# Patient Record
Sex: Female | Born: 1940
Health system: Southern US, Community
[De-identification: ages and names within clinical notes are randomized; demographics above are authoritative.]

## PROBLEM LIST (undated history)

## (undated) DIAGNOSIS — M179 Osteoarthritis of knee, unspecified: Secondary | ICD-10-CM

## (undated) DIAGNOSIS — K589 Irritable bowel syndrome without diarrhea: Secondary | ICD-10-CM

## (undated) DIAGNOSIS — F32A Depression, unspecified: Secondary | ICD-10-CM

## (undated) DIAGNOSIS — Z91018 Allergy to other foods: Secondary | ICD-10-CM

## (undated) DIAGNOSIS — R739 Hyperglycemia, unspecified: Secondary | ICD-10-CM

## (undated) DIAGNOSIS — J984 Other disorders of lung: Secondary | ICD-10-CM

## (undated) DIAGNOSIS — F17200 Nicotine dependence, unspecified, uncomplicated: Secondary | ICD-10-CM

## (undated) DIAGNOSIS — Z8719 Personal history of other diseases of the digestive system: Secondary | ICD-10-CM

## (undated) DIAGNOSIS — K219 Gastro-esophageal reflux disease without esophagitis: Secondary | ICD-10-CM

## (undated) DIAGNOSIS — M8580 Other specified disorders of bone density and structure, unspecified site: Secondary | ICD-10-CM

## (undated) DIAGNOSIS — Z923 Personal history of irradiation: Secondary | ICD-10-CM

## (undated) DIAGNOSIS — E785 Hyperlipidemia, unspecified: Secondary | ICD-10-CM

## (undated) DIAGNOSIS — C4491 Basal cell carcinoma of skin, unspecified: Secondary | ICD-10-CM

## (undated) DIAGNOSIS — K602 Anal fissure, unspecified: Secondary | ICD-10-CM

## (undated) DIAGNOSIS — M1710 Unilateral primary osteoarthritis, unspecified knee: Secondary | ICD-10-CM

## (undated) DIAGNOSIS — I2510 Atherosclerotic heart disease of native coronary artery without angina pectoris: Secondary | ICD-10-CM

## (undated) DIAGNOSIS — F419 Anxiety disorder, unspecified: Secondary | ICD-10-CM

## (undated) DIAGNOSIS — F329 Major depressive disorder, single episode, unspecified: Secondary | ICD-10-CM

## (undated) DIAGNOSIS — J302 Other seasonal allergic rhinitis: Secondary | ICD-10-CM

## (undated) DIAGNOSIS — J45909 Unspecified asthma, uncomplicated: Secondary | ICD-10-CM

## (undated) DIAGNOSIS — F338 Other recurrent depressive disorders: Secondary | ICD-10-CM

## (undated) DIAGNOSIS — J309 Allergic rhinitis, unspecified: Secondary | ICD-10-CM

## (undated) DIAGNOSIS — J8410 Pulmonary fibrosis, unspecified: Secondary | ICD-10-CM

## (undated) DIAGNOSIS — C50919 Malignant neoplasm of unspecified site of unspecified female breast: Secondary | ICD-10-CM

## (undated) HISTORY — DX: Other recurrent depressive disorders: F33.8

## (undated) HISTORY — DX: Nicotine dependence, unspecified, uncomplicated: F17.200

## (undated) HISTORY — DX: Anal fissure, unspecified: K60.2

## (undated) HISTORY — DX: Personal history of other diseases of the digestive system: Z87.19

## (undated) HISTORY — DX: Hyperlipidemia, unspecified: E78.5

## (undated) HISTORY — DX: Pulmonary fibrosis, unspecified: J84.10

## (undated) HISTORY — DX: Other disorders of lung: J98.4

## (undated) HISTORY — PX: TONSILLECTOMY: SUR1361

## (undated) HISTORY — DX: Unspecified asthma, uncomplicated: J45.909

## (undated) HISTORY — DX: Atherosclerotic heart disease of native coronary artery without angina pectoris: I25.10

## (undated) HISTORY — DX: Basal cell carcinoma of skin, unspecified: C44.91

## (undated) HISTORY — DX: Gastro-esophageal reflux disease without esophagitis: K21.9

## (undated) HISTORY — DX: Allergy to other foods: Z91.018

## (undated) HISTORY — DX: Malignant neoplasm of unspecified site of unspecified female breast: C50.919

## (undated) HISTORY — DX: Hyperglycemia, unspecified: R73.9

## (undated) HISTORY — PX: BROW LIFT: SHX178

## (undated) HISTORY — DX: Allergic rhinitis, unspecified: J30.9

## (undated) HISTORY — DX: Other specified disorders of bone density and structure, unspecified site: M85.80

## (undated) HISTORY — PX: CATARACT EXTRACTION: SUR2

## (undated) HISTORY — DX: Osteoarthritis of knee, unspecified: M17.9

## (undated) HISTORY — DX: Irritable bowel syndrome, unspecified: K58.9

## (undated) HISTORY — DX: Unilateral primary osteoarthritis, unspecified knee: M17.10

---

## 1999-05-30 ENCOUNTER — Encounter: Admission: RE | Admit: 1999-05-30 | Discharge: 1999-05-30 | Payer: Self-pay | Admitting: Family Medicine

## 1999-05-30 ENCOUNTER — Encounter: Payer: Self-pay | Admitting: Family Medicine

## 2000-07-04 ENCOUNTER — Encounter: Payer: Self-pay | Admitting: Family Medicine

## 2000-07-04 ENCOUNTER — Encounter: Admission: RE | Admit: 2000-07-04 | Discharge: 2000-07-04 | Payer: Self-pay | Admitting: Family Medicine

## 2000-07-15 ENCOUNTER — Other Ambulatory Visit: Admission: RE | Admit: 2000-07-15 | Discharge: 2000-07-15 | Payer: Self-pay | Admitting: Family Medicine

## 2001-07-23 ENCOUNTER — Encounter: Admission: RE | Admit: 2001-07-23 | Discharge: 2001-07-23 | Payer: Self-pay | Admitting: Family Medicine

## 2001-07-23 ENCOUNTER — Encounter: Payer: Self-pay | Admitting: Family Medicine

## 2001-07-31 ENCOUNTER — Other Ambulatory Visit: Admission: RE | Admit: 2001-07-31 | Discharge: 2001-07-31 | Payer: Self-pay | Admitting: Family Medicine

## 2002-01-12 ENCOUNTER — Encounter: Admission: RE | Admit: 2002-01-12 | Discharge: 2002-01-12 | Payer: Self-pay | Admitting: Family Medicine

## 2002-01-12 ENCOUNTER — Encounter: Payer: Self-pay | Admitting: Family Medicine

## 2002-07-30 ENCOUNTER — Encounter: Admission: RE | Admit: 2002-07-30 | Discharge: 2002-07-30 | Payer: Self-pay | Admitting: Family Medicine

## 2002-07-30 ENCOUNTER — Encounter: Payer: Self-pay | Admitting: Family Medicine

## 2002-08-03 ENCOUNTER — Other Ambulatory Visit: Admission: RE | Admit: 2002-08-03 | Discharge: 2002-08-03 | Payer: Self-pay | Admitting: Family Medicine

## 2003-11-10 ENCOUNTER — Ambulatory Visit (HOSPITAL_COMMUNITY): Admission: RE | Admit: 2003-11-10 | Discharge: 2003-11-10 | Payer: Self-pay | Admitting: Family Medicine

## 2004-11-10 ENCOUNTER — Ambulatory Visit (HOSPITAL_COMMUNITY): Admission: RE | Admit: 2004-11-10 | Discharge: 2004-11-10 | Payer: Self-pay | Admitting: Family Medicine

## 2004-12-05 ENCOUNTER — Other Ambulatory Visit: Admission: RE | Admit: 2004-12-05 | Discharge: 2004-12-05 | Payer: Self-pay | Admitting: Family Medicine

## 2005-11-28 ENCOUNTER — Ambulatory Visit (HOSPITAL_COMMUNITY): Admission: RE | Admit: 2005-11-28 | Discharge: 2005-11-28 | Payer: Self-pay | Admitting: Family Medicine

## 2005-12-31 ENCOUNTER — Other Ambulatory Visit: Admission: RE | Admit: 2005-12-31 | Discharge: 2005-12-31 | Payer: Self-pay | Admitting: Family Medicine

## 2007-01-21 ENCOUNTER — Ambulatory Visit (HOSPITAL_COMMUNITY): Admission: RE | Admit: 2007-01-21 | Discharge: 2007-01-21 | Payer: Self-pay | Admitting: Family Medicine

## 2007-01-31 ENCOUNTER — Encounter: Admission: RE | Admit: 2007-01-31 | Discharge: 2007-01-31 | Payer: Self-pay | Admitting: Family Medicine

## 2007-03-17 ENCOUNTER — Encounter: Admission: RE | Admit: 2007-03-17 | Discharge: 2007-03-17 | Payer: Self-pay | Admitting: Family Medicine

## 2007-08-07 ENCOUNTER — Encounter: Admission: RE | Admit: 2007-08-07 | Discharge: 2007-08-07 | Payer: Self-pay | Admitting: Family Medicine

## 2008-02-06 ENCOUNTER — Other Ambulatory Visit: Admission: RE | Admit: 2008-02-06 | Discharge: 2008-02-06 | Payer: Self-pay | Admitting: Internal Medicine

## 2008-02-13 ENCOUNTER — Ambulatory Visit: Payer: Self-pay | Admitting: Gastroenterology

## 2008-02-27 HISTORY — PX: COLONOSCOPY: SHX174

## 2008-03-02 ENCOUNTER — Ambulatory Visit: Payer: Self-pay | Admitting: Gastroenterology

## 2008-03-03 ENCOUNTER — Encounter: Payer: Self-pay | Admitting: Gastroenterology

## 2008-03-09 ENCOUNTER — Ambulatory Visit (HOSPITAL_COMMUNITY): Admission: RE | Admit: 2008-03-09 | Discharge: 2008-03-09 | Payer: Self-pay | Admitting: Gastroenterology

## 2008-04-08 ENCOUNTER — Encounter: Admission: RE | Admit: 2008-04-08 | Discharge: 2008-05-10 | Payer: Self-pay | Admitting: Rheumatology

## 2009-03-29 ENCOUNTER — Telehealth (INDEPENDENT_AMBULATORY_CARE_PROVIDER_SITE_OTHER): Payer: Self-pay | Admitting: *Deleted

## 2009-04-04 ENCOUNTER — Encounter: Admission: RE | Admit: 2009-04-04 | Discharge: 2009-04-04 | Payer: Self-pay | Admitting: Anesthesiology

## 2010-03-28 NOTE — Progress Notes (Signed)
Summary: Record request  Medical release received. Records mailed to [facility] [address] [place], [place] [address]. Michele [name]  March 29, 2009 10:55 AM

## 2010-04-06 ENCOUNTER — Other Ambulatory Visit: Payer: Self-pay | Admitting: Internal Medicine

## 2010-04-06 DIAGNOSIS — Z1231 Encounter for screening mammogram for malignant neoplasm of breast: Secondary | ICD-10-CM

## 2010-05-08 ENCOUNTER — Ambulatory Visit: Payer: Self-pay

## 2010-05-19 ENCOUNTER — Ambulatory Visit
Admission: RE | Admit: 2010-05-19 | Discharge: 2010-05-19 | Disposition: A | Discharge status: Home / Self Care | Payer: Medicare Other | Source: Ambulatory Visit | Attending: Internal Medicine | Admitting: Internal Medicine

## 2010-05-19 DIAGNOSIS — Z1231 Encounter for screening mammogram for malignant neoplasm of breast: Secondary | ICD-10-CM

## 2010-05-23 ENCOUNTER — Other Ambulatory Visit: Payer: Self-pay | Admitting: Internal Medicine

## 2010-05-23 DIAGNOSIS — R928 Other abnormal and inconclusive findings on diagnostic imaging of breast: Secondary | ICD-10-CM

## 2010-06-01 ENCOUNTER — Ambulatory Visit
Admission: RE | Admit: 2010-06-01 | Discharge: 2010-06-01 | Disposition: A | Discharge status: Home / Self Care | Payer: Medicare Other | Source: Ambulatory Visit | Attending: Internal Medicine | Admitting: Internal Medicine

## 2010-06-01 DIAGNOSIS — R928 Other abnormal and inconclusive findings on diagnostic imaging of breast: Secondary | ICD-10-CM

## 2010-10-20 ENCOUNTER — Other Ambulatory Visit: Payer: Self-pay | Admitting: Internal Medicine

## 2010-10-20 DIAGNOSIS — R921 Mammographic calcification found on diagnostic imaging of breast: Secondary | ICD-10-CM

## 2010-11-30 ENCOUNTER — Ambulatory Visit
Admission: RE | Admit: 2010-11-30 | Discharge: 2010-11-30 | Disposition: A | Discharge status: Home / Self Care | Payer: Medicare Other | Source: Ambulatory Visit | Attending: Internal Medicine | Admitting: Internal Medicine

## 2010-11-30 DIAGNOSIS — R921 Mammographic calcification found on diagnostic imaging of breast: Secondary | ICD-10-CM

## 2011-05-02 ENCOUNTER — Other Ambulatory Visit: Payer: Self-pay | Admitting: Internal Medicine

## 2011-05-02 DIAGNOSIS — R921 Mammographic calcification found on diagnostic imaging of breast: Secondary | ICD-10-CM

## 2011-05-29 ENCOUNTER — Ambulatory Visit
Admission: RE | Admit: 2011-05-29 | Discharge: 2011-05-29 | Disposition: A | Discharge status: Home / Self Care | Payer: Medicare Other | Source: Ambulatory Visit | Attending: Internal Medicine | Admitting: Internal Medicine

## 2011-05-29 DIAGNOSIS — R921 Mammographic calcification found on diagnostic imaging of breast: Secondary | ICD-10-CM

## 2012-05-08 ENCOUNTER — Other Ambulatory Visit: Payer: Self-pay

## 2012-05-08 DIAGNOSIS — Z1231 Encounter for screening mammogram for malignant neoplasm of breast: Secondary | ICD-10-CM

## 2012-06-16 ENCOUNTER — Ambulatory Visit
Admission: RE | Admit: 2012-06-16 | Discharge: 2012-06-16 | Disposition: A | Discharge status: Home / Self Care | Payer: Medicare Other | Source: Ambulatory Visit

## 2012-06-16 DIAGNOSIS — Z1231 Encounter for screening mammogram for malignant neoplasm of breast: Secondary | ICD-10-CM

## 2013-02-09 ENCOUNTER — Encounter: Payer: Self-pay | Admitting: Gastroenterology

## 2013-06-04 ENCOUNTER — Other Ambulatory Visit: Payer: Self-pay

## 2013-06-04 DIAGNOSIS — Z1231 Encounter for screening mammogram for malignant neoplasm of breast: Secondary | ICD-10-CM

## 2013-06-23 ENCOUNTER — Ambulatory Visit
Admission: RE | Admit: 2013-06-23 | Discharge: 2013-06-23 | Disposition: A | Discharge status: Home / Self Care | Payer: Medicare Other | Source: Ambulatory Visit

## 2013-06-23 ENCOUNTER — Encounter (INDEPENDENT_AMBULATORY_CARE_PROVIDER_SITE_OTHER): Payer: Self-pay

## 2013-06-23 DIAGNOSIS — Z1231 Encounter for screening mammogram for malignant neoplasm of breast: Secondary | ICD-10-CM

## 2013-08-05 ENCOUNTER — Other Ambulatory Visit (HOSPITAL_COMMUNITY): Payer: Self-pay | Admitting: Internal Medicine

## 2013-08-05 ENCOUNTER — Encounter (HOSPITAL_COMMUNITY): Payer: Self-pay | Admitting: *Deleted

## 2013-08-05 DIAGNOSIS — R0789 Other chest pain: Secondary | ICD-10-CM

## 2013-09-04 ENCOUNTER — Ambulatory Visit (HOSPITAL_COMMUNITY)
Admission: RE | Admit: 2013-09-04 | Discharge: 2013-09-04 | Disposition: A | Discharge status: Home / Self Care | Payer: Medicare Other | Source: Ambulatory Visit | Attending: Cardiovascular Disease | Admitting: Cardiovascular Disease

## 2013-09-04 DIAGNOSIS — R0789 Other chest pain: Secondary | ICD-10-CM | POA: Insufficient documentation

## 2013-09-04 NOTE — Procedures (Signed)
Exercise Treadmill Test   Test  Exercise Tolerance Test Ordering MD: Thressa Sheller, MD  Interpreting MD:   Unique Test No: 1   Treadmill:  1  Indication for ETT: chest pain - rule out ischemia  Contraindication to ETT: Yes   Stress Modality: exercise - treadmill  Cardiac Imaging Performed: non   Protocol: standard Bruce - maximal  Max BP:  217/99  Max MPHR (bpm):  145 85% MPR (bpm):  123  MPHR obtained (bpm):  144 % MPHR obtained: 99  Reached 85% MPHR (min:sec): 4:37 Total Exercise Time (min-sec): 6:01  Workload in METS:  7.00 Borg Scale:   Reason ETT Terminated:  fatigue    ST Segment Analysis At Rest: normal ST segments - no evidence of significant ST depression With Exercise: no evidence of significant ST depression  Other Information Arrhythmia:  Frequent PVC's at rest and during exercise, no change in frequency Angina during ETT:  absent (0) Quality of ETT:  diagnostic  ETT Interpretation:  normal - no evidence of ischemia by ST analysis  Comments: Frequent PVC's at rest and with exercise No ischemic EKG changes noted Good exercise effort given age Hypertensive response to exercise  Pixie Casino, MD, [facility] Attending Cardiologist [place]

## 2013-09-08 ENCOUNTER — Telehealth (HOSPITAL_COMMUNITY): Payer: Self-pay | Admitting: *Deleted

## 2014-05-28 ENCOUNTER — Other Ambulatory Visit: Payer: Self-pay

## 2014-05-28 DIAGNOSIS — Z1231 Encounter for screening mammogram for malignant neoplasm of breast: Secondary | ICD-10-CM

## 2014-06-28 ENCOUNTER — Ambulatory Visit
Admission: RE | Admit: 2014-06-28 | Discharge: 2014-06-28 | Disposition: A | Discharge status: Home / Self Care | Payer: Medicare Other | Source: Ambulatory Visit

## 2014-06-28 ENCOUNTER — Encounter (INDEPENDENT_AMBULATORY_CARE_PROVIDER_SITE_OTHER): Payer: Self-pay

## 2014-06-28 DIAGNOSIS — Z1231 Encounter for screening mammogram for malignant neoplasm of breast: Secondary | ICD-10-CM

## 2014-10-20 ENCOUNTER — Encounter: Payer: Self-pay | Admitting: Gastroenterology

## 2014-11-17 ENCOUNTER — Other Ambulatory Visit: Payer: Self-pay | Admitting: Allergy and Immunology

## 2014-11-26 LAB — ALPHA-GAL PANEL
Alpha Gal IgE*: 15.8 kU/L — ABNORMAL HIGH (ref <0.35)
Beef (Bos spp) IgE: 6.38 kU/L — ABNORMAL HIGH (ref <0.35)
Class Interpretation: 2
Class Interpretation: 2
Class Interpretation: 3
Lamb/Mutton (Ovis spp) IgE: 2.48 kU/L — ABNORMAL HIGH (ref <0.35)
Pork (Sus spp) IgE: 3.19 kU/L — ABNORMAL HIGH (ref <0.35)

## 2014-11-26 LAB — COMP. METABOLIC PANEL (12)
AST: 14 IU/L (ref 0–40)
Albumin/Globulin Ratio: 1.8 (ref 1.1–2.5)
Albumin: 4.4 g/dL (ref 3.5–4.8)
Alkaline Phosphatase: 51 IU/L (ref 39–117)
BUN/Creatinine Ratio: 13 (ref 11–26)
BUN: 13 mg/dL (ref 8–27)
Bilirubin Total: 0.5 mg/dL (ref 0.0–1.2)
Calcium: 9.6 mg/dL (ref 8.7–10.3)
Chloride: 97 mmol/L (ref 97–108)
Creatinine, Ser: 1 mg/dL (ref 0.57–1.00)
GFR calc Af Amer: 63 mL/min/{1.73_m2} (ref >59)
GFR calc non Af Amer: 55 mL/min/{1.73_m2} — ABNORMAL LOW (ref >59)
Globulin, Total: 2.5 g/dL (ref 1.5–4.5)
Glucose: 94 mg/dL (ref 65–99)
Potassium: 4.9 mmol/L (ref 3.5–5.2)
Sodium: 139 mmol/L (ref 134–144)
Total Protein: 6.9 g/dL (ref 6.0–8.5)

## 2014-11-26 LAB — CBC WITH DIFFERENTIAL/PLATELET
Basophils Absolute: 0 10*3/uL (ref 0.0–0.2)
Basos: 1 %
EOS (ABSOLUTE): 0.2 10*3/uL (ref 0.0–0.4)
Eos: 4 %
Hematocrit: 43 % (ref 34.0–46.6)
Hemoglobin: 14.5 g/dL (ref 11.1–15.9)
Immature Grans (Abs): 0 10*3/uL (ref 0.0–0.1)
Immature Granulocytes: 0 %
Lymphocytes Absolute: 2.1 10*3/uL (ref 0.7–3.1)
Lymphs: 42 %
MCH: 31.9 pg (ref 26.6–33.0)
MCHC: 33.7 g/dL (ref 31.5–35.7)
MCV: 95 fL (ref 79–97)
Monocytes Absolute: 0.4 10*3/uL (ref 0.1–0.9)
Monocytes: 8 %
Neutrophils Absolute: 2.2 10*3/uL (ref 1.4–7.0)
Neutrophils: 45 %
Platelets: 246 10*3/uL (ref 150–379)
RBC: 4.55 x10E6/uL (ref 3.77–5.28)
RDW: 14.4 % (ref 12.3–15.4)
WBC: 4.8 10*3/uL (ref 3.4–10.8)

## 2014-11-26 LAB — ANA W/REFLEX IF POSITIVE: Anti Nuclear Antibody(ANA): NEGATIVE

## 2014-11-26 LAB — SEDIMENTATION RATE: Sed Rate: 3 mm/hr (ref 0–40)

## 2014-11-26 LAB — C4 COMPLEMENT: Complement C4, Serum: 22 mg/dL (ref 14–44)

## 2014-11-26 LAB — TRYPTASE: Tryptase: 5.3 ug/L (ref 2.2–13.2)

## 2014-11-26 LAB — CHRONIC URTICARIA: cu index: 3.7 (ref <10)

## 2014-11-26 LAB — H. PYLORI BREATH TEST: H. pylori UBiT: NEGATIVE

## 2014-11-29 ENCOUNTER — Telehealth: Payer: Self-pay

## 2014-11-29 NOTE — Telephone Encounter (Signed)
-----   Message from Ephraim Hamburger, MD sent at 11/29/2014  3:00 PM EDT ----- Please inform patient: Elevated specific IgE to offer gal, beef, pork, and lamb.  Please have patient avoid nonprimate mammalian meat and have access to epinephrine autoinjector 2 pack.  If she does not have an EpiPen 2 pack, please send in the prescription.  Thank you.

## 2014-11-29 NOTE — Telephone Encounter (Signed)
Called and spoke to patient and informed her about her lab results.  I asked if she has her epipen and she stated that she has the script and I told her to make sure that she gets it filled and she stated that she was thinking about.

## 2014-12-09 NOTE — Telephone Encounter (Signed)
Encounter complete. 

## 2014-12-14 ENCOUNTER — Telehealth: Payer: Self-pay | Admitting: Allergy and Immunology

## 2014-12-14 NOTE — Telephone Encounter (Signed)
Pt had positive lab results to [facility]. Bobbitt suggested she stay away from [facility]. She is wondering how long. Is this permanent? pls advise

## 2014-12-14 NOTE — Telephone Encounter (Signed)
Dr. Verlin Fester, please advise on the below message.

## 2014-12-14 NOTE — Telephone Encounter (Signed)
Spoke with patient advise as written from Dr. Verlin Fester pt verbalizes understanding.

## 2014-12-14 NOTE — Telephone Encounter (Signed)
We will recheck alpha-gal one year after the initial test was performed. It is not necessarily permanent but she should avoid mammalian meats and have access to epinephrine autoinjector until the alpha-gal IgE levels have normalized. Thanks.

## 2015-05-25 ENCOUNTER — Other Ambulatory Visit: Payer: Self-pay

## 2015-05-25 DIAGNOSIS — Z1231 Encounter for screening mammogram for malignant neoplasm of breast: Secondary | ICD-10-CM

## 2015-07-05 ENCOUNTER — Ambulatory Visit
Admission: RE | Admit: 2015-07-05 | Discharge: 2015-07-05 | Disposition: A | Discharge status: Home / Self Care | Payer: Medicare Other | Source: Ambulatory Visit

## 2015-07-05 DIAGNOSIS — Z1231 Encounter for screening mammogram for malignant neoplasm of breast: Secondary | ICD-10-CM

## 2015-11-22 ENCOUNTER — Ambulatory Visit (INDEPENDENT_AMBULATORY_CARE_PROVIDER_SITE_OTHER): Payer: Medicare Other | Admitting: Allergy and Immunology

## 2015-11-22 ENCOUNTER — Encounter (INDEPENDENT_AMBULATORY_CARE_PROVIDER_SITE_OTHER): Payer: Self-pay

## 2015-11-22 ENCOUNTER — Encounter: Payer: Self-pay | Admitting: Allergy and Immunology

## 2015-11-22 DIAGNOSIS — T7800XD Anaphylactic reaction due to unspecified food, subsequent encounter: Secondary | ICD-10-CM | POA: Diagnosis not present

## 2015-11-22 DIAGNOSIS — T7800XA Anaphylactic reaction due to unspecified food, initial encounter: Secondary | ICD-10-CM | POA: Insufficient documentation

## 2015-11-22 NOTE — Patient Instructions (Signed)
Alpha gal hypersensitivity  Continue meticulous avoidance of all nonprimate mammalian meats and have access to epinephrine autoinjector 2 pack in case of accidental ingestion.  Emergency allergy action plan is in place.  A laboratory order form has been provided to recheck galactose-alpha-1,3-galactose IgE level.   When lab results have returned the patient will be called with further recommendations and follow up instructions.

## 2015-11-22 NOTE — Progress Notes (Signed)
    Follow-up Note  RE: Michele [name] MRN: [id number] DOB: [birth year] Date of Office Visit: 11/22/2015  Primary care provider: Thressa Sheller, MD Referring provider: Thressa Sheller, MD  History of present illness: Michele [name] is a 75 y.o. female with alpha-gal hypersensitivity presents today for follow up.  She was last seen in this clinic in September 2016.  She has attempted to strictly avoid nonprimate mammalian meats.  She reports that she accidentally bit into and egg roll containing pork and began to develop mild systemic symptoms but did not require epinephrine.   Assessment and plan: Alpha gal hypersensitivity  Continue meticulous avoidance of all nonprimate mammalian meats and have access to epinephrine autoinjector 2 pack in case of accidental ingestion.  Emergency allergy action plan is in place.  A laboratory order form has been provided to recheck galactose-alpha-1,3-galactose IgE level.   Physical examination: Blood pressure 132/70, pulse 73, temperature 98.4 F (36.9 C), temperature source Oral, resp. rate 16, height 5' 3.25" (1.607 m), weight 162 lb (73.5 kg), SpO2 96 %.  General: Alert, interactive, in no acute distress. Neck: Supple without lymphadenopathy. Lungs: Clear to auscultation without wheezing, rhonchi or rales. CV: Normal S1, S2 without murmurs. Skin: Warm and dry, without lesions or rashes.  The following portions of the patient's history were reviewed and updated as appropriate: allergies, current medications, past family history, past medical history, past social history, past surgical history and problem list.    Medication List       Accurate as of 11/22/15  1:18 PM. Always use your most recent med list.          CELEXA PO Take 10 mg by mouth as needed.   clonazePAM 0.5 MG tablet Commonly known as:  KLONOPIN Take 0.5 mg by mouth as needed for anxiety.       Allergies  Allergen Reactions  . Azithromycin Itching  .  Penicillins     REACTION: Does not work    I appreciate the opportunity to take part in [name]'s care. Please do not hesitate to contact me with questions.  Sincerely,   R. Edgar Frisk, MD

## 2015-11-22 NOTE — Assessment & Plan Note (Signed)
   Continue meticulous avoidance of all nonprimate mammalian meats and have access to epinephrine autoinjector 2 pack in case of accidental ingestion.  Emergency allergy action plan is in place.  A laboratory order form has been provided to recheck galactose-alpha-1,3-galactose IgE level.

## 2015-11-26 LAB — GALACTOSE-ALPHA-1,3-GALACTOSE IGE: Galactose-alpha-1,3-galactose IgE: 58.5 kU/L — ABNORMAL HIGH (ref <0.35)

## 2017-08-26 HISTORY — PX: BREAST BIOPSY: SHX20

## 2017-09-09 ENCOUNTER — Other Ambulatory Visit: Payer: Self-pay | Admitting: Internal Medicine

## 2017-09-09 DIAGNOSIS — Z1231 Encounter for screening mammogram for malignant neoplasm of breast: Secondary | ICD-10-CM

## 2017-09-16 ENCOUNTER — Ambulatory Visit
Admission: RE | Admit: 2017-09-16 | Discharge: 2017-09-16 | Disposition: A | Discharge status: Home / Self Care | Payer: Medicare Other | Source: Ambulatory Visit | Attending: Internal Medicine | Admitting: Internal Medicine

## 2017-09-16 DIAGNOSIS — Z1231 Encounter for screening mammogram for malignant neoplasm of breast: Secondary | ICD-10-CM

## 2017-09-18 ENCOUNTER — Other Ambulatory Visit: Payer: Self-pay | Admitting: Internal Medicine

## 2017-09-18 DIAGNOSIS — R928 Other abnormal and inconclusive findings on diagnostic imaging of breast: Secondary | ICD-10-CM

## 2017-09-20 ENCOUNTER — Ambulatory Visit
Admission: RE | Admit: 2017-09-20 | Discharge: 2017-09-20 | Disposition: A | Discharge status: Home / Self Care | Payer: Medicare Other | Source: Ambulatory Visit | Attending: Internal Medicine | Admitting: Internal Medicine

## 2017-09-20 ENCOUNTER — Other Ambulatory Visit: Payer: Self-pay | Admitting: Internal Medicine

## 2017-09-20 DIAGNOSIS — R921 Mammographic calcification found on diagnostic imaging of breast: Secondary | ICD-10-CM

## 2017-09-20 DIAGNOSIS — R928 Other abnormal and inconclusive findings on diagnostic imaging of breast: Secondary | ICD-10-CM

## 2017-09-25 ENCOUNTER — Ambulatory Visit
Admission: RE | Admit: 2017-09-25 | Discharge: 2017-09-25 | Disposition: A | Discharge status: Home / Self Care | Payer: Medicare Other | Source: Ambulatory Visit | Attending: Internal Medicine | Admitting: Internal Medicine

## 2017-09-25 ENCOUNTER — Other Ambulatory Visit: Payer: Self-pay | Admitting: Internal Medicine

## 2017-09-25 DIAGNOSIS — R928 Other abnormal and inconclusive findings on diagnostic imaging of breast: Secondary | ICD-10-CM

## 2017-09-25 DIAGNOSIS — R921 Mammographic calcification found on diagnostic imaging of breast: Secondary | ICD-10-CM

## 2017-09-26 ENCOUNTER — Other Ambulatory Visit: Payer: Self-pay | Admitting: Internal Medicine

## 2017-09-26 DIAGNOSIS — R921 Mammographic calcification found on diagnostic imaging of breast: Secondary | ICD-10-CM

## 2017-09-27 ENCOUNTER — Telehealth: Payer: Self-pay | Admitting: Hematology and Oncology

## 2017-09-27 NOTE — Telephone Encounter (Signed)
Voicemail left by patient confirming afternoon [facility] appointment for 8/14, packet will be mailed

## 2017-09-30 ENCOUNTER — Ambulatory Visit
Admission: RE | Admit: 2017-09-30 | Discharge: 2017-09-30 | Disposition: A | Discharge status: Home / Self Care | Payer: Medicare Other | Source: Ambulatory Visit | Attending: Internal Medicine | Admitting: Internal Medicine

## 2017-09-30 ENCOUNTER — Encounter: Payer: Self-pay | Admitting: *Deleted

## 2017-09-30 DIAGNOSIS — D0512 Intraductal carcinoma in situ of left breast: Secondary | ICD-10-CM | POA: Insufficient documentation

## 2017-09-30 DIAGNOSIS — R921 Mammographic calcification found on diagnostic imaging of breast: Secondary | ICD-10-CM

## 2017-10-09 ENCOUNTER — Ambulatory Visit
Admission: RE | Admit: 2017-10-09 | Discharge: 2017-10-09 | Disposition: A | Discharge status: Home / Self Care | Payer: Medicare Other | Source: Ambulatory Visit | Attending: Radiation Oncology | Admitting: Radiation Oncology

## 2017-10-09 ENCOUNTER — Other Ambulatory Visit: Payer: Self-pay | Admitting: General Surgery

## 2017-10-09 ENCOUNTER — Inpatient Hospital Stay: Payer: Medicare Other | Attending: Hematology and Oncology | Admitting: Hematology and Oncology

## 2017-10-09 ENCOUNTER — Encounter: Payer: Self-pay | Admitting: Hematology and Oncology

## 2017-10-09 ENCOUNTER — Inpatient Hospital Stay: Payer: Medicare Other

## 2017-10-09 ENCOUNTER — Ambulatory Visit: Payer: Medicare Other | Admitting: Physical Therapy

## 2017-10-09 DIAGNOSIS — Z171 Estrogen receptor negative status [ER-]: Secondary | ICD-10-CM | POA: Insufficient documentation

## 2017-10-09 DIAGNOSIS — D0512 Intraductal carcinoma in situ of left breast: Secondary | ICD-10-CM | POA: Diagnosis present

## 2017-10-09 DIAGNOSIS — C50412 Malignant neoplasm of upper-outer quadrant of left female breast: Secondary | ICD-10-CM

## 2017-10-09 LAB — CBC WITH DIFFERENTIAL (CANCER CENTER ONLY)
Basophils Absolute: 0 10*3/uL (ref 0.0–0.1)
Basophils Relative: 1 %
EOS PCT: 2 %
Eosinophils Absolute: 0.1 10*3/uL (ref 0.0–0.5)
HCT: 43.4 % (ref 34.8–46.6)
Hemoglobin: 14.3 g/dL (ref 11.6–15.9)
LYMPHS ABS: 1.5 10*3/uL (ref 0.9–3.3)
Lymphocytes Relative: 26 %
MCH: 31.3 pg (ref 25.1–34.0)
MCHC: 33 g/dL (ref 31.5–36.0)
MCV: 94.9 fL (ref 79.5–101.0)
MONOS PCT: 9 %
Monocytes Absolute: 0.5 10*3/uL (ref 0.1–0.9)
Neutro Abs: 3.7 10*3/uL (ref 1.5–6.5)
Neutrophils Relative %: 62 %
PLATELETS: 259 10*3/uL (ref 145–400)
RBC: 4.57 MIL/uL (ref 3.70–5.45)
RDW: 13.7 % (ref 11.2–14.5)
WBC Count: 5.9 10*3/uL (ref 3.9–10.3)

## 2017-10-09 LAB — CMP (CANCER CENTER ONLY)
ALK PHOS: 55 U/L (ref 38–126)
ALT: 10 U/L (ref 0–44)
AST: 16 U/L (ref 15–41)
Albumin: 4.3 g/dL (ref 3.5–5.0)
Anion gap: 11 (ref 5–15)
BUN: 15 mg/dL (ref 8–23)
CHLORIDE: 102 mmol/L (ref 98–111)
CO2: 28 mmol/L (ref 22–32)
Calcium: 9.2 mg/dL (ref 8.9–10.3)
Creatinine: 0.88 mg/dL (ref 0.44–1.00)
GFR, Est AFR Am: >60 mL/min (ref >60)
Glucose, Bld: 113 mg/dL — ABNORMAL HIGH (ref 70–99)
Potassium: 4.7 mmol/L (ref 3.5–5.1)
SODIUM: 141 mmol/L (ref 135–145)
Total Bilirubin: 0.6 mg/dL (ref 0.3–1.2)
Total Protein: 7.4 g/dL (ref 6.5–8.1)

## 2017-10-09 NOTE — Progress Notes (Signed)
Radiation Oncology         ([phone]) [phone] ________________________________  Name: Michele [name]        MRN: [id number]  Date of Service: 10/09/2017 DOB: 25-Dec-1940  WU:[id number], Thayer Jew, MD  [name], MD     REFERRING PHYSICIAN: [name], MD   DIAGNOSIS: The encounter diagnosis was Ductal carcinoma in situ (DCIS) of left breast.   HISTORY OF PRESENT ILLNESS: Michele [name] is a 77 y.o. female seen in the multidisciplinary breast clinic for a new diagnosis of left breast cancer. The patient was noted to have a group of calcifications of the left breast on screening mammography. This prompted diagnostic imaging which revealed 2 areas of calcification, a 1.5 cm span and a 1.2 cm span and on tomo imaging anterior to this and immediately behind the nipple was another area of calcifications that was not sampled as well as a fourth area also anterior to the other biopsied sites that was sampled. The two lesions in the upper outer quadrant revealed high grade DCIS with calcifications, ER/PR positive. The more anterior lesion was biopsied on 09/30/17 and was consistent with a benign fibrocystic changes. She comes today to discuss options of treatment of her cancer.    PREVIOUS RADIATION THERAPY: No   PAST MEDICAL HISTORY:  Past Medical History:  Diagnosis Date  . Asthma    only as a child; went away by age 6/19       PAST SURGICAL HISTORY: Past Surgical History:  Procedure Laterality Date  . BROW LIFT       FAMILY HISTORY:  Family History  Problem Relation Age of Onset  . Asthma Mother   . COPD Mother   . Asthma Sister   . COPD Sister      SOCIAL HISTORY:  reports that she quit smoking about 35 years ago. Her smoking use included cigarettes. She started smoking about 61 years ago. She has quit using smokeless tobacco. She reports that she drinks about 2.0 standard drinks of alcohol per week. She reports that she does not use drugs. The patient is married and lives in  [place]. She is a Licensed conveyancer.    ALLERGIES: Azithromycin; Influenza vaccines; and Penicillins   MEDICATIONS:  Current Outpatient Medications  Medication Sig Dispense Refill  . Cholecalciferol (VITAMIN D3) 1000 units CAPS Take by mouth daily.    . Citalopram Hydrobromide (CELEXA PO) Take 10 mg by mouth as needed.    . Coenzyme Q10 (COQ10 PO) Take by mouth daily.    . TURMERIC PO Take by mouth daily.     No current facility-administered medications for this encounter.      REVIEW OF SYSTEMS: On review of systems, the patient reports that she is doing well overall. She denies any chest pain, shortness of breath, cough, fevers, chills, night sweats, unintended weight changes. She denies any bowel or bladder disturbances, and denies abdominal pain, nausea or vomiting. She denies any new musculoskeletal or joint aches or pains. A complete review of systems is obtained and is otherwise negative.     PHYSICAL EXAM:  Wt Readings from Last 3 Encounters:  10/09/17 161 lb (73 kg)  11/22/15 162 lb (73.5 kg)   Temp Readings from Last 3 Encounters:  10/09/17 97.8 F (36.6 C) (Oral)  11/22/15 98.4 F (36.9 C) (Oral)   BP Readings from Last 3 Encounters:  10/09/17 (!) 169/62  11/22/15 132/70   Pulse Readings from Last 3 Encounters:  10/09/17 75  11/22/15 73  In general this is a well appearing caucasian female in no acute distress. She is alert and oriented x4 and appropriate throughout the examination. HEENT reveals that the patient is normocephalic, atraumatic. EOMs are intact. PERRLA. Skin is intact without any evidence of gross lesions. Cardiovascular exam reveals a regular rate and rhythm, no clicks rubs or murmurs are auscultated. Chest is clear to auscultation bilaterally. Lymphatic assessment is performed and does not reveal any adenopathy in the cervical, supraclavicular, axillary, or inguinal chains. Bilateral breast exam is performed and reveals multiple biopsy sites in  the left breast with anticipated post biopsy fullness deep to the sites. No erythema is noted. Abdomen has active bowel sounds in all quadrants and is intact. The abdomen is soft, non tender, non distended. Lower extremities are negative for pretibial pitting edema, deep calf tenderness, cyanosis or clubbing.   ECOG = 0  0 - Asymptomatic (Fully active, able to carry on all predisease activities without restriction)  1 - Symptomatic but completely ambulatory (Restricted in physically strenuous activity but ambulatory and able to carry out work of a light or sedentary nature. For example, light housework, office work)  2 - Symptomatic, <50% in bed during the day (Ambulatory and capable of all self care but unable to carry out any work activities. Up and about more than 50% of waking hours)  3 - Symptomatic, >50% in bed, but not bedbound (Capable of only limited self-care, confined to bed or chair 50% or more of waking hours)  4 - Bedbound (Completely disabled. Cannot carry on any self-care. Totally confined to bed or chair)  5 - Death   Eustace Pen MM, Creech RH, Tormey DC, et al. [phone]). "Toxicity and response criteria of the [facility]". [place] Oncol. 5 (6): 649-55    LABORATORY DATA:  Lab Results  Component Value Date   WBC 5.9 10/09/2017   HGB 14.3 10/09/2017   HCT 43.4 10/09/2017   MCV 94.9 10/09/2017   PLT 259 10/09/2017   Lab Results  Component Value Date   NA 141 10/09/2017   K 4.7 10/09/2017   CL 102 10/09/2017   CO2 28 10/09/2017   Lab Results  Component Value Date   ALT 10 10/09/2017   AST 16 10/09/2017   ALKPHOS 55 10/09/2017   BILITOT 0.6 10/09/2017      RADIOGRAPHY: Mm Digital Diagnostic Unilat L  Result Date: 09/20/2017 CLINICAL DATA:  Recall from screening mammography with tomosynthesis, calcifications involving the UPPER OUTER QUADRANT of the LEFT breast at MIDDLE depth. EXAM: DIGITAL DIAGNOSTIC LEFT MAMMOGRAM COMPARISON:   09/16/2017, 07/05/2015 and earlier. ACR Breast Density Category b: There are scattered areas of fibroglandular density. FINDINGS: Standard spot magnification CC and MLO views of the LEFT breast calcifications were obtained. There are 2 separate groups of calcifications involving the UPPER OUTER QUADRANT of the LEFT breast at MIDDLE depth which overlap on the spot magnification CC view. The more SUPERIOR group of calcifications spans approximately 1.5 cm and the more INFERIOR group spans approximately 1.2 cm. Both groups contain linear and branching forms and the calcifications are pleomorphic in morphology. The 2 groups are approximately 2 cm apart in the [place]. IMPRESSION: Two separate groups of suspicious calcifications involving the UPPER OUTER QUADRANT of the LEFT breast. The 2 groups are approximately 2 cm apart. The SUPERIOR group spans approximately 1.5 cm and the INFERIOR group spans approximately 1.2 cm. RECOMMENDATION: Stereotactic core needle biopsy of the 2 groups of suspicious calcifications in  the UPPER OUTER QUADRANT of the LEFT breast. The core needle biopsy procedure was discussed with the patient and her questions were answered. She has agreed to proceed and the biopsies have been scheduled for Wednesday, July 31 at 10:30 a.m. I have discussed the findings and recommendations with the patient. Results were also provided in writing at the conclusion of the visit. BI-RADS CATEGORY  4: Suspicious. Electronically Signed   By: Evangeline Dakin M.D.   On: 09/20/2017 16:39   Mm 3d Screen Breast Bilateral  Result Date: 09/17/2017 CLINICAL DATA:  Screening. EXAM: DIGITAL SCREENING BILATERAL MAMMOGRAM WITH TOMO AND CAD COMPARISON:  Previous exam(s). ACR Breast Density Category b: There are scattered areas of fibroglandular density. FINDINGS: In the left breast, calcifications warrant further evaluation. In the right breast, no findings suspicious for malignancy. Images were processed with  CAD. IMPRESSION: Further evaluation is suggested for calcifications in the left breast. RECOMMENDATION: Diagnostic mammogram of the left breast. (Code:[id number]) The patient will be contacted regarding the findings, and additional imaging will be scheduled. BI-RADS CATEGORY  0: Incomplete. Need additional imaging evaluation and/or prior mammograms for comparison. Electronically Signed   By: Lajean Manes M.D.   On: 09/17/2017 12:58   Mm Clip Placement Left  Result Date: 09/30/2017 CLINICAL DATA:  77 year old female status post stereotactic biopsy of left breast calcifications EXAM: DIAGNOSTIC LEFT MAMMOGRAM POST STEREOTACTIC BIOPSY COMPARISON:  Previous exam(s). FINDINGS: Mammographic images were obtained following stereotactic guided biopsy of left breast calcifications. A ribbon shaped clip is identified in the central left breast at anterior depth. Additional coil and X shaped tissue markers are also noted. IMPRESSION: Ribbon shaped clip in the expected location status post stereotactic biopsy of left breast calcifications. Final Assessment: Post Procedure Mammograms for Marker Placement Electronically Signed   By: Kristopher Oppenheim M.D.   On: 09/30/2017 10:16   Mm Clip Placement Left  Result Date: 09/25/2017 CLINICAL DATA:  Post biopsy mammogram of the left breast for clip placement. EXAM: DIAGNOSTIC LEFT MAMMOGRAM POST STEREOTACTIC BIOPSY COMPARISON:  Previous exam(s). FINDINGS: Mammographic images were obtained following stereotactic guided biopsy of 2 groups of calcifications in the left breast. The coil shaped biopsy marking clip is well positioned at the site of biopsy in the upper-outer superior left breast. The X shaped biopsy marking clip migrated approximately 5 cm medial to the site of biopsy. There are residual calcifications at the actual biopsy site which may be used for localization. IMPRESSION: 1. The coil shaped biopsy marking clip is well positioned at the site of biopsy in the upper-outer  left breast. 2. The X shaped biopsy marking clip is approximately 5 cm medially displaced from the site of biopsy. There are residual calcifications which may be used for localization if necessary. Final Assessment: Post Procedure Mammograms for Marker Placement Electronically Signed   By: Ammie Ferrier M.D.   On: 09/25/2017 11:35   Mm Lt Breast Bx W Loc Dev 1st Lesion Image Bx Spec Stereo Guide  Addendum Date: 10/02/2017   ADDENDUM REPORT: 10/01/2017 12:42 ADDENDUM: Pathology revealed FIBROCYSTIC CHANGES WITH CALCIFICATIONS of the Left breast, central anterior (ribbon clip). This was found to be concordant by Dr. Kristopher Oppenheim. Please note, 2 stereotactic biopsies performed on 09/25/2017 demonstrate high-grade ductal carcinoma in situ with calcifications. There is approximately 5 cm medial displacement of the X shaped clip from the site of biopsy. Residual calcifications are present which may be used for localization. Pathology results were discussed with the patient by telephone. The patient reported doing  well after the biopsy with tenderness at the site. Post biopsy instructions and care were reviewed and questions were answered. The patient was encouraged to call [facility] for any additional concerns. The patient has a recent diagnosis of left breast cancer and should follow her outlined treatment plan. The patient was referred to [facility] on October 09, 2017. Pathology results reported by Terie Purser, RN on 10/01/2017. Electronically Signed   By: Kristopher Oppenheim M.D.   On: 10/01/2017 12:42   Result Date: 10/02/2017 CLINICAL DATA:  77 year old female with known left breast DCIS presenting for additional biopsy of anterior left breast calcifications. EXAM: LEFT BREAST STEREOTACTIC CORE NEEDLE BIOPSY COMPARISON:  Previous exams. FINDINGS: The patient and I discussed the procedure of  stereotactic-guided biopsy including benefits and alternatives. We discussed the high likelihood of a successful procedure. We discussed the risks of the procedure including infection, bleeding, tissue injury, clip migration, and inadequate sampling. Informed written consent was given. The usual time out protocol was performed immediately prior to the procedure. Using sterile technique and 1% Lidocaine as local anesthetic, under stereotactic guidance, a 9 gauge vacuum assisted device was used to perform core needle biopsy of calcifications in the central left breast at anterior depth using a superior approach. Specimen radiograph was performed showing calcifications in several specimens. Specimens with calcifications are identified for pathology. Lesion quadrant: Upper outer quadrant At the conclusion of the procedure, a ribbon shaped tissue marker clip was deployed into the biopsy cavity. Follow-up 2-view mammogram was performed and dictated separately. IMPRESSION: Stereotactic-guided biopsy of left breast calcifications. No apparent complications. Electronically Signed: By: Kristopher Oppenheim M.D. On: 09/30/2017 10:15   Mm Lt Breast Bx W Loc Dev 1st Lesion Image Bx Spec Stereo Guide  Addendum Date: 09/26/2017   ADDENDUM REPORT: 09/26/2017 14:10 ADDENDUM: Pathology revealed HIGH GRADE DUCTAL CARCINOMA IN SITU WITH CALCIFICATIONS of the Left breast, both locations, upper outer superior and upper outer inferior. This was found to be concordant by Dr. Ammie Ferrier. Pathology results were discussed with the patient by telephone. The patient reported doing well after the biopsies with tenderness at the sites. Post biopsy instructions and care were reviewed and questions were answered. The patient was encouraged to call [facility] for any additional concerns. The patient was referred to [facility] at [facility] on October 02, 2017. The patient is scheduled for an additional Left breast stereotatic biopsy on September 30, 2017. Pathology results reported by Terie Purser, RN on 09/26/2017. Electronically Signed   By: Ammie Ferrier M.D.   On: 09/26/2017 14:10   Result Date: 09/26/2017 CLINICAL DATA:  77 year old female presenting for stereotactic biopsy of left breast calcifications. EXAM: LEFT BREAST STEREOTACTIC CORE NEEDLE BIOPSY COMPARISON:  Previous exams. FINDINGS: The patient and I discussed the procedure of stereotactic-guided biopsy including benefits and alternatives. We discussed the high likelihood of a successful procedure. We discussed the risks of the procedure including infection, bleeding, tissue injury, clip migration, and inadequate sampling. Informed written consent was given. The usual time out protocol was performed immediately prior to the procedure. Using sterile technique and 1% Lidocaine as local anesthetic, under stereotactic guidance, a 9 gauge vacuum assisted device was used to perform core needle biopsy of calcifications in the superior upper-outer quadrant of the left breast using a lateral approach. Specimen radiograph was performed showing calcifications within  several core samples. Specimens with calcifications are identified for pathology. Lesion quadrant: Upper-outer quadrant At the conclusion of the procedure, a coil shaped tissue marker clip was deployed into the biopsy cavity. ---------------------------------------------------------------- Using sterile technique and 1% Lidocaine as local anesthetic, under stereotactic guidance, a 9 gauge vacuum assisted device was used to perform core needle biopsy of calcifications in the inferior upper-outer quadrant of the left breast using a lateral approach. Specimen radiograph was performed showing calcifications within multiple core samples. Specimens with calcifications are identified for pathology. Lesion quadrant: Upper-outer quadrant At the conclusion of  the procedure, a X shaped tissue marker clip was deployed into the biopsy cavity. Follow-up 2-view mammogram was performed and dictated separately. IMPRESSION: 1. Stereotactic-guided biopsy of calcifications in the superior upper outer left breast. No apparent complications. 2. Stereotactic guided biopsy of calcifications in the inferior upper-outer left breast. No apparent complications. Electronically Signed: By: Ammie Ferrier M.D. On: 09/25/2017 11:30   Mm Lt Breast Bx W Loc Dev Ea Ad Lesion Img Bx Spec Stereo Guide  Addendum Date: 09/26/2017   ADDENDUM REPORT: 09/26/2017 14:10 ADDENDUM: Pathology revealed HIGH GRADE DUCTAL CARCINOMA IN SITU WITH CALCIFICATIONS of the Left breast, both locations, upper outer superior and upper outer inferior. This was found to be concordant by Dr. Ammie Ferrier. Pathology results were discussed with the patient by telephone. The patient reported doing well after the biopsies with tenderness at the sites. Post biopsy instructions and care were reviewed and questions were answered. The patient was encouraged to call [facility] for any additional concerns. The patient was referred to [facility] on October 02, 2017. The patient is scheduled for an additional Left breast stereotatic biopsy on September 30, 2017. Pathology results reported by Terie Purser, RN on 09/26/2017. Electronically Signed   By: Ammie Ferrier M.D.   On: 09/26/2017 14:10   Result Date: 09/26/2017 CLINICAL DATA:  77 year old female presenting for stereotactic biopsy of left breast calcifications. EXAM: LEFT BREAST STEREOTACTIC CORE NEEDLE BIOPSY COMPARISON:  Previous exams. FINDINGS: The patient and I discussed the procedure of stereotactic-guided biopsy including benefits and alternatives. We discussed the high likelihood of a successful procedure. We discussed the risks of the procedure including  infection, bleeding, tissue injury, clip migration, and inadequate sampling. Informed written consent was given. The usual time out protocol was performed immediately prior to the procedure. Using sterile technique and 1% Lidocaine as local anesthetic, under stereotactic guidance, a 9 gauge vacuum assisted device was used to perform core needle biopsy of calcifications in the superior upper-outer quadrant of the left breast using a lateral approach. Specimen radiograph was performed showing calcifications within several core samples. Specimens with calcifications are identified for pathology. Lesion quadrant: Upper-outer quadrant At the conclusion of the procedure, a coil shaped tissue marker clip was deployed into the biopsy cavity. ---------------------------------------------------------------- Using sterile technique and 1% Lidocaine as local anesthetic, under stereotactic guidance, a 9 gauge vacuum assisted device was used to perform core needle biopsy of calcifications in the inferior upper-outer quadrant of the left breast using a lateral approach. Specimen radiograph was performed showing calcifications within multiple core samples. Specimens with calcifications are identified for pathology. Lesion quadrant: Upper-outer quadrant At the conclusion of the procedure, a X shaped tissue marker clip was deployed into the biopsy cavity. Follow-up 2-view mammogram was performed and dictated separately. IMPRESSION: 1. Stereotactic-guided biopsy of calcifications in the superior upper outer left breast. No  apparent complications. 2. Stereotactic guided biopsy of calcifications in the inferior upper-outer left breast. No apparent complications. Electronically Signed: By: Ammie Ferrier M.D. On: 09/25/2017 11:30       IMPRESSION/PLAN: 1. High grade ER/PR negative DCIS of the left breast. Dr. Lisbeth Renshaw discusses the pathology findings and reviews the nature of left non invasive breast disease. The consensus from the  breast conference includes breast conservation with lumpectomy. We would recommend adjuvant treatment given the high grade and negative ER/PR pathways. We discussed the risks, benefits, short, and long term effects of radiotherapy, and the patient is interested in proceeding. Dr. Lisbeth Renshaw discusses the delivery and logistics of radiotherapy and anticipates a course of 4 weeks of radiotherapy. We will see her back about 2 weeks after surgery and plan same day simulation,  We anticipate starting radiotherapy about 4-6 weeks after surgery.  The above documentation reflects my direct findings during this shared patient visit. Please see the separate note by Dr. Lisbeth Renshaw on this date for the remainder of the patient's plan of care.    Carola Rhine, PAC

## 2017-10-09 NOTE — Progress Notes (Signed)
Nutrition Assessment  Reason for Assessment:  Pt seen in Breast Clinic  ASSESSMENT:   77 year old female with new diagnosis of breast cancer.  Past medical history reviewed.  Patient reports normal appetite  Medications:  reviewed  Labs: reviewed  Anthropometrics:   Height: 63.25 inches Weight: 161 lb  BMI: 28   NUTRITION DIAGNOSIS: Food and nutrition related knowledge deficit related to new diagnosis of breast cancer as evidenced by no prior need for nutrition related information.  INTERVENTION:   Discussed and provided packet of information regarding nutritional tips for breast cancer patients.  Questions answered.  Teachback method used.  Contact information provided and patient knows to contact me with questions/concerns.    MONITORING, EVALUATION, and GOAL: Pt will consume a healthy plant based diet to maintain lean body mass throughout treatment.   Zuzanna Maroney B. Zenia Resides, [place], [place] Registered Dietitian [phone] (pager)

## 2017-10-09 NOTE — Progress Notes (Signed)
Michele [name] NOTE  Patient Care Team: Deland Pretty, MD as PCP - General (Internal Medicine) [name], MD as Consulting Physician (General Surgery) Nicholas Lose, MD as Consulting Physician (Hematology and Oncology) Kyung Rudd, MD as Consulting Physician (Radiation Oncology)  CHIEF COMPLAINTS/PURPOSE OF CONSULTATION:  Newly diagnosed left breast DCIS  HISTORY OF PRESENTING ILLNESS:  Michele [name] 77 y.o. female is here because of recent diagnosis of left breast DCIS.  Patient had a routine screening mammogram that detected left breast calcifications in the upper outer quadrant measuring 1.5 cm and 1.2 cm.  Both of which were biopsy-proven to be high-grade DCIS which were ER PR negative.  Retroareolar area there were 2 groups of calcifications anterior one could not be biopsied but the posterior one was  fibrocystic changes.  Patient was presented this morning to the multidisciplinary tumor board and she is here today accompanied by her husband to discuss her treatment plan.  I reviewed her records extensively and collaborated the history with the patient.  SUMMARY OF ONCOLOGIC HISTORY:   Ductal carcinoma in situ (DCIS) of left breast   09/30/2017 Initial Diagnosis    Screening detected left breast calcifications 2 groups upper outer quadrant 1.5 cm and 1.2 cm both of which are biopsy-proven high-grade DCIS with calcifications ER PR negative.  Underneath the nipple 2 groups of calcifications.  Posterior group was biopsy-proven fibrocystic change anterior group could not be biopsied.  Tis NX stage 0      MEDICAL HISTORY:  Past Medical History:  Diagnosis Date  . Asthma    only as a child; went away by age 52/19  . GERD (gastroesophageal reflux disease)     SURGICAL HISTORY: Past Surgical History:  Procedure Laterality Date  . BROW LIFT    . CATARACT EXTRACTION      SOCIAL HISTORY: Social History   Socioeconomic History  . Marital status: Married     Spouse name: Not on file  . Number of children: Not on file  . Years of education: Not on file  . Highest education level: Not on file  Occupational History  . Not on file  Social Needs  . Financial resource strain: Not on file  . Food insecurity:    Worry: Not on file    Inability: Not on file  . Transportation needs:    Medical: Not on file    Non-medical: Not on file  Tobacco Use  . Smoking status: Former Smoker    Types: Cigarettes    Start date: 07/18/1956    Last attempt to quit: 07/19/1982    Years since quitting: 35.2  . Smokeless tobacco: Former Network engineer and Sexual Activity  . Alcohol use: Yes    Alcohol/week: 2.0 standard drinks    Types: 1 Glasses of wine, 1 Shots of liquor per week    Comment: 2 in the eveing before dinner  . Drug use: No  . Sexual activity: Never  Lifestyle  . Physical activity:    Days per week: Not on file    Minutes per session: Not on file  . Stress: Not on file  Relationships  . Social connections:    Talks on phone: Not on file    Gets together: Not on file    Attends religious service: Not on file    Active member of club or organization: Not on file    Attends meetings of clubs or organizations: Not on file    Relationship status: Not  on file  . Intimate partner violence:    Fear of current or ex partner: Not on file    Emotionally abused: Not on file    Physically abused: Not on file    Forced sexual activity: Not on file  Other Topics Concern  . Not on file  Social History Narrative  . Not on file    FAMILY HISTORY: Family History  Problem Relation Age of Onset  . Asthma Mother   . COPD Mother   . Asthma Sister   . COPD Sister   . Breast cancer Sister        half sister    ALLERGIES:  is allergic to azithromycin; influenza vaccines; and penicillins.  MEDICATIONS:  Current Outpatient Medications  Medication Sig Dispense Refill  . Cholecalciferol (VITAMIN D3) 1000 units CAPS Take by mouth daily.    .  Citalopram Hydrobromide (CELEXA PO) Take 10 mg by mouth as needed.    . Coenzyme Q10 (COQ10 PO) Take by mouth daily.    . TURMERIC PO Take by mouth daily.     No current facility-administered medications for this visit.     REVIEW OF SYSTEMS:   Constitutional: Denies fevers, chills or abnormal night sweats Eyes: Denies blurriness of vision, double vision or watery eyes Ears, nose, mouth, throat, and face: Denies mucositis or sore throat Respiratory: Denies cough, dyspnea or wheezes Cardiovascular: Denies palpitation, chest discomfort or lower extremity swelling Gastrointestinal:  Denies nausea, heartburn or change in bowel habits Skin: Denies abnormal skin rashes Lymphatics: Denies new lymphadenopathy or easy bruising Neurological:Denies numbness, tingling or new weaknesses Behavioral/Psych: Mood is stable, no new changes  Breast:  Denies any palpable lumps or discharge All other systems were reviewed with the patient and are negative.  PHYSICAL EXAMINATION: ECOG PERFORMANCE STATUS: 0 - Asymptomatic  Vitals:   10/09/17 1310  BP: (!) 169/62  Pulse: 75  Resp: 16  Temp: 97.8 F (36.6 C)  SpO2: 97%   Filed Weights   10/09/17 1310  Weight: 161 lb (73 kg)    GENERAL:alert, no distress and comfortable SKIN: skin color, texture, turgor are normal, no rashes or significant lesions EYES: normal, conjunctiva are pink and non-injected, sclera clear OROPHARYNX:no exudate, no erythema and lips, buccal mucosa, and tongue normal  NECK: supple, thyroid normal size, non-tender, without nodularity LYMPH:  no palpable lymphadenopathy in the cervical, axillary or inguinal LUNGS: clear to auscultation and percussion with normal breathing effort HEART: regular rate & rhythm and no murmurs and no lower extremity edema ABDOMEN:abdomen soft, non-tender and normal bowel sounds Musculoskeletal:no cyanosis of digits and no clubbing  PSYCH: alert & oriented x 3 with fluent speech NEURO: no focal  motor/sensory deficits BREAST: No palpable nodules in breast. No palpable axillary or supraclavicular lymphadenopathy (exam performed in the presence of a chaperone)   LABORATORY DATA:  I have reviewed the data as listed Lab Results  Component Value Date   WBC 5.9 10/09/2017   HGB 14.3 10/09/2017   HCT 43.4 10/09/2017   MCV 94.9 10/09/2017   PLT 259 10/09/2017   Lab Results  Component Value Date   NA 141 10/09/2017   K 4.7 10/09/2017   CL 102 10/09/2017   CO2 28 10/09/2017    RADIOGRAPHIC STUDIES: I have personally reviewed the radiological reports and agreed with the findings in the report.  ASSESSMENT AND PLAN:  Ductal carcinoma in situ (DCIS) of left breast 09/30/2017:Screening detected left breast calcifications 2 groups upper outer quadrant 1.5 cm and  1.2 cm both of which are biopsy-proven high-grade DCIS with calcifications ER PR negative.  Underneath the nipple 2 groups of calcifications.  Posterior group was biopsy-proven fibrocystic change anterior group could not be biopsied.  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  Pathology review: I discussed with the patient the difference between DCIS and invasive breast cancer. It is considered a precancerous lesion. DCIS is classified as a 0. It is generally detected through mammograms as calcifications. We discussed the significance of grades and its impact on prognosis. We also discussed the importance of ER and PR receptors and their implications to adjuvant treatment options. Prognosis of DCIS dependence on grade, comedo necrosis. It is anticipated that if not treated, 20-30% of DCIS can develop into invasive breast cancer.  Recommendation: 1. Breast conserving surgery with biopsy of the retroareolar abnormality 2. Followed by adjuvant radiation therapy  Return to clinic after surgery to discuss the final pathology report and come up with an adjuvant treatment plan.  All questions were  answered. The patient knows to call the clinic with any problems, questions or concerns.    Harriette Ohara, MD 10/09/17

## 2017-10-09 NOTE — Assessment & Plan Note (Signed)
09/30/2017:Screening detected left breast calcifications 2 groups upper outer quadrant 1.5 cm and 1.2 cm both of which are biopsy-proven high-grade DCIS with calcifications ER PR negative.  Underneath the nipple 2 groups of calcifications.  Posterior group was biopsy-proven fibrocystic change anterior group could not be biopsied.  Tis NX stage 0  Pathology review: I discussed with the patient the difference between DCIS and invasive breast cancer. It is considered a precancerous lesion. DCIS is classified as a 0. It is generally detected through mammograms as calcifications. We discussed the significance of grades and its impact on prognosis. We also discussed the importance of ER and PR receptors and their implications to adjuvant treatment options. Prognosis of DCIS dependence on grade, comedo necrosis. It is anticipated that if not treated, 20-30% of DCIS can develop into invasive breast cancer.  Recommendation: 1. Breast conserving surgery 2. Followed by adjuvant radiation therapy  Return to clinic after surgery to discuss the final pathology report and come up with an adjuvant treatment plan.

## 2017-10-11 ENCOUNTER — Encounter: Payer: Self-pay | Admitting: General Practice

## 2017-10-11 NOTE — Progress Notes (Signed)
[place] Psychosocial Distress Screening Spiritual Care  Reached [facility] by phone following [facility] to introduce [facility] team/resources, reviewing distress screen per protocol.  The patient scored a 7 on the Psychosocial Distress Thermometer which indicates severe distress. Also assessed for distress and other psychosocial needs.   ONCBCN DISTRESS SCREENING 10/11/2017  Screening Type Initial Screening  Distress experienced in past week (1-10) 7  Emotional problem type Nervousness/Anxiety;Adjusting to illness  Information Concerns Type Lack of info about diagnosis;Lack of info about treatment;Lack of info about complementary therapy choices  Referral to support programs Yes   Ms Freda was upbeat during this call, citing relief at the early stage of her dx, proclaiming, "I'm good. 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  Follow up needed: No. Per pt, no needs or concerns at this time. Pt is aware of ongoing [facility] team/programming resources, but please also page if needs arise or circumstances change. Thank you.   [place], [place], [facility] Pager [phone] Voicemail [phone]

## 2017-10-15 ENCOUNTER — Telehealth: Payer: Self-pay | Admitting: *Deleted

## 2017-10-15 NOTE — Telephone Encounter (Signed)
  Oncology Nurse Navigator Documentation  Navigator Location: [facility] (10/15/17 1400)   )Navigator Encounter Type: Telephone;[facility] Follow-up (10/15/17 1400) Telephone: Outgoing Call;Clinic/[facility] Follow-up (10/15/17 1400)                                                  Time Spent with Patient: 15 (10/15/17 1400)

## 2017-10-17 ENCOUNTER — Other Ambulatory Visit: Payer: Self-pay | Admitting: General Surgery

## 2017-10-17 DIAGNOSIS — Z171 Estrogen receptor negative status [ER-]: Principal | ICD-10-CM

## 2017-10-17 DIAGNOSIS — C50412 Malignant neoplasm of upper-outer quadrant of left female breast: Secondary | ICD-10-CM

## 2017-10-18 ENCOUNTER — Other Ambulatory Visit: Payer: Self-pay | Admitting: *Deleted

## 2017-10-18 DIAGNOSIS — D0512 Intraductal carcinoma in situ of left breast: Secondary | ICD-10-CM

## 2017-10-21 ENCOUNTER — Telehealth: Payer: Self-pay | Admitting: Hematology and Oncology

## 2017-10-21 ENCOUNTER — Encounter: Payer: Self-pay | Admitting: Radiation Oncology

## 2017-10-21 NOTE — Telephone Encounter (Signed)
Scheduled appt per 8/22 sch message - pt is aware of appt date and time.

## 2017-10-27 HISTORY — PX: BREAST LUMPECTOMY: SHX2

## 2017-11-01 ENCOUNTER — Other Ambulatory Visit: Payer: Self-pay

## 2017-11-01 ENCOUNTER — Encounter (HOSPITAL_BASED_OUTPATIENT_CLINIC_OR_DEPARTMENT_OTHER): Payer: Self-pay

## 2017-11-03 NOTE — H&P (Signed)
Michele [name] Location: [facility] Patient #: [id number] DOB: 30-Mar-1940 Undefined / Language: Michele [name] / Race: White Female      History of Present Illness     . This is a pleasant, healthy 77 year old female, referred by Dr. Jeanmarie Plant at the [facility] for evaluation and management of multifocal ductal carcinoma in situ left breast as well as suspicious calcifications in the retroareolar area. Dr. Deland Pretty is her PCP. She is seen in the [facility] recently  by Dr. Lindi Adie, Dr. Lisbeth Renshaw and me. Her husband was with her throughout the encounter.     She has no prior history of breast problems. Gets screening mammograms about every other year. Recent mammograms showed category B density. There are 2 groups of suspicious calcifications in the left breast, upper outer quadrant, 2 cm apart. The superior area is 1.5 cm in diameter and the inferior area is 1.2 cm. Both areas were biopsied. Both showed high grade ductal carcinoma in situ. Estrogen and progesterone receptor negative. There were also 2 areas of suspicious linear calcifications in the left retroareolar area. A deeper area was biopsied and showed fibrocystic changes. A more superficial area could not be biopsied because of its superficial location She is strongly motivated for breast conservation and I think we can try to do that.      Past history reveals former smoker quit in the 73s. Otherwise very healthy without medical or surgical problems Family history is negative for ovarian or prostate cancer. 2 half sisters had breast cancer. Social history reveals she is married. 2 children. One stepchild. Drinks 2 mixed drinks a day with her husband. Quit smoking over 30 years ago.      We had a long talk. She is not interested in mastectomy. I told her that we would try to perform left breast lumpectomy 2 with radioactive seed localization 2 to get the areas of DCIS out of her upper outer quadrant left breast. Hopefully  this can be a single incision. To excise the subareolar calcifications we are going to plan wire localization because seen would be very superficial and likely to be dislodged and/or lost. She is in favor of this approach She will be scheduled for left breast lumpectomy 2 with radioactive seed localization 2 and excision left retroareolar calcifications with wire localization. This will probably require biopsies and the wire to go in the same day in the morning of the surgery be done in the afternoon.      I discussed the indications, details, techniques, and numerous risk of the surgery with her and her husband. She is aware of the risk of bleeding, infection, cosmetic deformity, nipple will become insensate and flattened. Skin necrosis. Reoperation for positive margins. She understands all these issues. All of her questions are answered. She agrees with this plan.   Medication History  Medications Reconciled  Social History Alcohol use  Occasional alcohol use. No drug use  Tobacco use  Former smoker.  Family History  Alcohol Abuse  Mother. Heart Disease  Father. Hypertension  Father. Respiratory Condition  Mother.  Other Problems  Asthma  Gastroesophageal Reflux Disease     Review of Systems Respiratory Not Present- Bloody sputum, Chronic Cough, Difficulty Breathing, Snoring and Wheezing. Cardiovascular Not Present- Chest Pain, Difficulty Breathing Lying Down, Leg Cramps, Palpitations, Rapid Heart Rate, Shortness of Breath and Swelling of Extremities. Female Genitourinary Not Present- Frequency, Nocturia, Painful Urination, Pelvic Pain and Urgency. Musculoskeletal Not Present- Back Pain, Joint Pain, Joint Stiffness, Muscle  Pain, Muscle Weakness and Swelling of Extremities. Neurological Not Present- Decreased Memory, Fainting, Headaches, Numbness, Seizures, Tingling, Tremor, Trouble walking and Weakness. Endocrine Not Present- Cold Intolerance, Excessive Hunger,  Hair Changes, Heat Intolerance, Hot flashes and New Diabetes. Hematology Not Present- Blood Thinners, Easy Bruising, Excessive bleeding, Gland problems, HIV and Persistent Infections.   Physical Exam  General Mental Status-Alert. General Appearance-Consistent with stated age. Hydration-Well hydrated. Voice-Normal.  Head and Neck Head-normocephalic, atraumatic with no lesions or palpable masses. Trachea-midline. Thyroid Gland Characteristics - normal size and consistency.  Eye Eyeball - Bilateral-Extraocular movements intact. Sclera/Conjunctiva - Bilateral-No scleral icterus.  Chest and Lung Exam Chest and lung exam reveals -quiet, even and easy respiratory effort with no use of accessory muscles and on auscultation, normal breath sounds, no adventitious sounds and normal vocal resonance. Inspection Chest Wall - Normal. Back - normal.  Breast Note: Breasts are medium to large in size. Biopsy sites left breast laterally. No hematoma. No mass in either breast. Nipple and areolar complexes looked normal without drainage. There is no axillary adenopathy on either side.   Cardiovascular Cardiovascular examination reveals -normal heart sounds, regular rate and rhythm with no murmurs and normal pedal pulses bilaterally.  Abdomen Inspection Inspection of the abdomen reveals - No Hernias. Skin - Scar - no surgical scars. Palpation/Percussion Palpation and Percussion of the abdomen reveal - Soft, Non Tender, No Rebound tenderness, No Rigidity (guarding) and No hepatosplenomegaly. Auscultation Auscultation of the abdomen reveals - Bowel sounds normal.  Neurologic Neurologic evaluation reveals -alert and oriented x 3 with no impairment of recent or remote memory. Mental Status-Normal.  Musculoskeletal Normal Exam - Left-Upper Extremity Strength Normal and Lower Extremity Strength Normal. Normal Exam - Right-Upper Extremity Strength Normal and Lower  Extremity Strength Normal.  Lymphatic Head & Neck  General Head & Neck Lymphatics: Bilateral - Description - Normal. Axillary  General Axillary Region: Bilateral - Description - Normal. Tenderness - Non Tender. Femoral & Inguinal  Generalized Femoral & Inguinal Lymphatics: Bilateral - Description - Normal. Tenderness - Non Tender.    Assessment & Plan   CANCER OF OVERLAPPING SITES OF LEFT BREAST (C50.812)  Your recent imaging studies and biopsy shows several findings In the upper outer quadrant of the left breast there are 2 separate areas of calcifications. These were both biopsies and both showed high-grade ductal carcinoma in situ, hormone receptor negative In the area behind your nipple there are 2 areas of suspicious calcifications. One of these areas was biopsied and showed benign fibrocystic changes. The other area was so superficial that they could not biopsy itr and recommended this be excised.  We have discussed options for surgical management including multiple lumpectomies and compared that to mastectomy. You have expressed an interest in breast conservation and I think you are a good candidate for that Following breast conservation surgery you will likely receive radiation therapy. Dr. Lisbeth Renshaw will make a final decision  you will be scheduled for left breast lumpectomy 2 with radioactive seed localization 2. This will take care of the two(2) known cancers in the upper outer left breast At the same time we will perform a left breast lumpectomy with wire localization for the area of calcifications behind the nipple. This will require a second incision as I described to you. This area may or may not be cancer I discussed the indications, techniques, and numerous risk of the surgery with you and your husband.  FORMER SMOKER [phone]) Impression: Roommate. Quit in 1980s. FAMILY HISTORY OF BREAST CANCER (Z80.3)  Impression: 2 half sisters had breast cancer

## 2017-11-07 NOTE — Anesthesia Preprocedure Evaluation (Addendum)
Anesthesia Evaluation  Patient identified by MRN, date of birth, ID band Patient awake    Reviewed: Allergy & Precautions, NPO status , Patient's Chart, lab work & pertinent test results  Airway Mallampati: II  TM Distance: >3 FB Neck ROM: Full    Dental no notable dental hx. (+) Teeth Intact, Dental Advisory Given   Pulmonary asthma , former smoker,    Pulmonary exam normal breath sounds clear to auscultation       Cardiovascular Exercise Tolerance: Good negative cardio ROS Normal cardiovascular exam Rhythm:Regular Rate:Normal     Neuro/Psych Anxiety    GI/Hepatic Neg liver ROS, GERD  Medicated,  Endo/Other    Renal/GU      Musculoskeletal negative musculoskeletal ROS (+)   Abdominal   Peds  Hematology   Anesthesia Other Findings   Reproductive/Obstetrics                            Anesthesia Physical Anesthesia Plan  ASA: III  Anesthesia Plan: General   Post-op Pain Management:    Induction: Intravenous  PONV Risk Score and Plan: 3 and Treatment may vary due to age or medical condition, Ondansetron and Dexamethasone  Airway Management Planned: LMA  Additional Equipment:   Intra-op Plan:   Post-operative Plan:   Informed Consent: I have reviewed the patients History and Physical, chart, labs and discussed the procedure including the risks, benefits and alternatives for the proposed anesthesia with the patient or authorized representative who has indicated his/her understanding and acceptance.   Dental advisory given  Plan Discussed with: CRNA  Anesthesia Plan Comments:        Anesthesia Quick Evaluation

## 2017-11-08 ENCOUNTER — Encounter (HOSPITAL_BASED_OUTPATIENT_CLINIC_OR_DEPARTMENT_OTHER)
Admission: RE | Disposition: A | Discharge status: Home / Self Care | Payer: Self-pay | Source: Ambulatory Visit | Attending: General Surgery

## 2017-11-08 ENCOUNTER — Ambulatory Visit (HOSPITAL_BASED_OUTPATIENT_CLINIC_OR_DEPARTMENT_OTHER): Payer: Medicare Other | Admitting: Anesthesiology

## 2017-11-08 ENCOUNTER — Other Ambulatory Visit: Payer: Self-pay | Admitting: General Surgery

## 2017-11-08 ENCOUNTER — Ambulatory Visit (HOSPITAL_BASED_OUTPATIENT_CLINIC_OR_DEPARTMENT_OTHER)
Admission: RE | Admit: 2017-11-08 | Discharge: 2017-11-08 | Disposition: A | Discharge status: Home / Self Care | Payer: Medicare Other | Source: Ambulatory Visit | Attending: General Surgery | Admitting: General Surgery

## 2017-11-08 ENCOUNTER — Ambulatory Visit
Admission: RE | Admit: 2017-11-08 | Discharge: 2017-11-08 | Disposition: A | Discharge status: Home / Self Care | Payer: Medicare Other | Source: Ambulatory Visit | Attending: General Surgery | Admitting: General Surgery

## 2017-11-08 ENCOUNTER — Encounter (HOSPITAL_BASED_OUTPATIENT_CLINIC_OR_DEPARTMENT_OTHER): Payer: Self-pay | Admitting: Anesthesiology

## 2017-11-08 ENCOUNTER — Other Ambulatory Visit: Payer: Self-pay

## 2017-11-08 DIAGNOSIS — Z171 Estrogen receptor negative status [ER-]: Principal | ICD-10-CM

## 2017-11-08 DIAGNOSIS — C50412 Malignant neoplasm of upper-outer quadrant of left female breast: Secondary | ICD-10-CM

## 2017-11-08 DIAGNOSIS — Z87891 Personal history of nicotine dependence: Secondary | ICD-10-CM | POA: Diagnosis not present

## 2017-11-08 DIAGNOSIS — D0512 Intraductal carcinoma in situ of left breast: Secondary | ICD-10-CM | POA: Diagnosis present

## 2017-11-08 DIAGNOSIS — K219 Gastro-esophageal reflux disease without esophagitis: Secondary | ICD-10-CM | POA: Insufficient documentation

## 2017-11-08 DIAGNOSIS — Z811 Family history of alcohol abuse and dependence: Secondary | ICD-10-CM | POA: Diagnosis not present

## 2017-11-08 DIAGNOSIS — F419 Anxiety disorder, unspecified: Secondary | ICD-10-CM | POA: Diagnosis not present

## 2017-11-08 DIAGNOSIS — Z836 Family history of other diseases of the respiratory system: Secondary | ICD-10-CM | POA: Diagnosis not present

## 2017-11-08 DIAGNOSIS — Z803 Family history of malignant neoplasm of breast: Secondary | ICD-10-CM | POA: Insufficient documentation

## 2017-11-08 DIAGNOSIS — Z8249 Family history of ischemic heart disease and other diseases of the circulatory system: Secondary | ICD-10-CM | POA: Diagnosis not present

## 2017-11-08 HISTORY — DX: Depression, unspecified: F32.A

## 2017-11-08 HISTORY — DX: Anxiety disorder, unspecified: F41.9

## 2017-11-08 HISTORY — DX: Other seasonal allergic rhinitis: J30.2

## 2017-11-08 HISTORY — DX: Major depressive disorder, single episode, unspecified: F32.9

## 2017-11-08 HISTORY — PX: BREAST LUMPECTOMY WITH RADIOACTIVE SEED LOCALIZATION: SHX6424

## 2017-11-08 HISTORY — PX: BREAST LUMPECTOMY WITH NEEDLE LOCALIZATION: SHX5759

## 2017-11-08 SURGERY — BREAST LUMPECTOMY WITH RADIOACTIVE SEED LOCALIZATION
Anesthesia: General | Site: Breast | Laterality: Left

## 2017-11-08 MED ORDER — CEFAZOLIN SODIUM-DEXTROSE 2-4 GM/100ML-% IV SOLN
2.0000 g | INTRAVENOUS | Status: AC
  Administered 2017-11-08: 2 g via INTRAVENOUS

## 2017-11-08 MED ORDER — EPHEDRINE 5 MG/ML INJ
INTRAVENOUS | Status: AC
  Filled 2017-11-08: qty 10

## 2017-11-08 MED ORDER — MIDAZOLAM HCL 2 MG/2ML IJ SOLN: 1.0000 mg | INTRAMUSCULAR | Status: DC | PRN

## 2017-11-08 MED ORDER — GABAPENTIN 300 MG PO CAPS
300.0000 mg | ORAL_CAPSULE | ORAL | Status: AC
  Administered 2017-11-08: 300 mg via ORAL

## 2017-11-08 MED ORDER — FENTANYL CITRATE (PF) 100 MCG/2ML IJ SOLN
INTRAMUSCULAR | Status: AC
  Filled 2017-11-08: qty 2

## 2017-11-08 MED ORDER — EPHEDRINE SULFATE 50 MG/ML IJ SOLN
INTRAMUSCULAR | Status: DC | PRN
  Administered 2017-11-08 (×2): 10 mg via INTRAVENOUS

## 2017-11-08 MED ORDER — FENTANYL CITRATE (PF) 100 MCG/2ML IJ SOLN
50.0000 ug | INTRAMUSCULAR | Status: DC | PRN
  Administered 2017-11-08: 100 ug via INTRAVENOUS

## 2017-11-08 MED ORDER — DEXAMETHASONE SODIUM PHOSPHATE 4 MG/ML IJ SOLN: INTRAMUSCULAR | Status: DC | PRN

## 2017-11-08 MED ORDER — LIDOCAINE HCL (CARDIAC) PF 100 MG/5ML IV SOSY
PREFILLED_SYRINGE | INTRAVENOUS | Status: DC | PRN
  Administered 2017-11-08: 100 mg via INTRAVENOUS

## 2017-11-08 MED ORDER — DEXMEDETOMIDINE HCL IN NACL 200 MCG/50ML IV SOLN
INTRAVENOUS | Status: DC | PRN
  Administered 2017-11-08: 16 ug via INTRAVENOUS

## 2017-11-08 MED ORDER — PHENYLEPHRINE 40 MCG/ML (10ML) SYRINGE FOR IV PUSH (FOR BLOOD PRESSURE SUPPORT)
PREFILLED_SYRINGE | INTRAVENOUS | Status: AC
  Filled 2017-11-08: qty 10

## 2017-11-08 MED ORDER — DEXAMETHASONE SODIUM PHOSPHATE 4 MG/ML IJ SOLN
INTRAMUSCULAR | Status: DC | PRN
  Administered 2017-11-08: 4 mg via INTRAVENOUS

## 2017-11-08 MED ORDER — ACETAMINOPHEN 500 MG PO TABS
1000.0000 mg | ORAL_TABLET | ORAL | Status: AC
  Administered 2017-11-08: 1000 mg via ORAL

## 2017-11-08 MED ORDER — DEXAMETHASONE SODIUM PHOSPHATE 10 MG/ML IJ SOLN
INTRAMUSCULAR | Status: AC
  Filled 2017-11-08: qty 1

## 2017-11-08 MED ORDER — LIDOCAINE 2% (20 MG/ML) 5 ML SYRINGE
INTRAMUSCULAR | Status: AC
  Filled 2017-11-08: qty 5

## 2017-11-08 MED ORDER — SCOPOLAMINE 1 MG/3DAYS TD PT72: 1.0000 | MEDICATED_PATCH | Freq: Once | TRANSDERMAL | Status: DC | PRN

## 2017-11-08 MED ORDER — CEFAZOLIN SODIUM-DEXTROSE 2-4 GM/100ML-% IV SOLN
INTRAVENOUS | Status: AC
  Filled 2017-11-08: qty 100

## 2017-11-08 MED ORDER — CHLORHEXIDINE GLUCONATE CLOTH 2 % EX PADS: 6.0000 | MEDICATED_PAD | Freq: Once | CUTANEOUS | Status: DC

## 2017-11-08 MED ORDER — LACTATED RINGERS IV SOLN
INTRAVENOUS | Status: DC
  Administered 2017-11-08 (×2): via INTRAVENOUS

## 2017-11-08 MED ORDER — TRAMADOL HCL 50 MG PO TABS: 50.0000 mg | ORAL_TABLET | Freq: Four times a day (QID) | ORAL | 0 refills | Status: DC | PRN

## 2017-11-08 MED ORDER — ONDANSETRON HCL 4 MG/2ML IJ SOLN
INTRAMUSCULAR | Status: AC
  Filled 2017-11-08: qty 2

## 2017-11-08 MED ORDER — DEXMEDETOMIDINE HCL IN NACL 200 MCG/50ML IV SOLN
INTRAVENOUS | Status: AC
  Filled 2017-11-08: qty 50

## 2017-11-08 MED ORDER — ACETAMINOPHEN 500 MG PO TABS
ORAL_TABLET | ORAL | Status: AC
  Filled 2017-11-08: qty 2

## 2017-11-08 MED ORDER — PROPOFOL 10 MG/ML IV BOLUS
INTRAVENOUS | Status: AC
  Filled 2017-11-08: qty 20

## 2017-11-08 MED ORDER — ACETAMINOPHEN 10 MG/ML IV SOLN: 1000.0000 mg | Freq: Once | INTRAVENOUS | Status: DC | PRN

## 2017-11-08 MED ORDER — FENTANYL CITRATE (PF) 100 MCG/2ML IJ SOLN
25.0000 ug | INTRAMUSCULAR | Status: DC | PRN
  Administered 2017-11-08: 50 ug via INTRAVENOUS

## 2017-11-08 MED ORDER — ONDANSETRON HCL 4 MG/2ML IJ SOLN: 4.0000 mg | Freq: Once | INTRAMUSCULAR | Status: DC | PRN

## 2017-11-08 MED ORDER — SUCCINYLCHOLINE CHLORIDE 200 MG/10ML IV SOSY
PREFILLED_SYRINGE | INTRAVENOUS | Status: AC
  Filled 2017-11-08: qty 10

## 2017-11-08 MED ORDER — ONDANSETRON HCL 4 MG/2ML IJ SOLN
INTRAMUSCULAR | Status: DC | PRN
  Administered 2017-11-08: 4 mg via INTRAVENOUS

## 2017-11-08 MED ORDER — BUPIVACAINE-EPINEPHRINE (PF) 0.5% -1:200000 IJ SOLN
INTRAMUSCULAR | Status: DC | PRN
  Administered 2017-11-08: 10 mL

## 2017-11-08 MED ORDER — GABAPENTIN 300 MG PO CAPS
ORAL_CAPSULE | ORAL | Status: AC
  Filled 2017-11-08: qty 1

## 2017-11-08 SURGICAL SUPPLY — 72 items
APPLIER CLIP 11 MED OPEN (CLIP) ×2
APPLIER CLIP 9.375 MED OPEN (MISCELLANEOUS)
BANDAGE ACE 6X5 VEL STRL LF (GAUZE/BANDAGES/DRESSINGS) IMPLANT
BENZOIN TINCTURE PRP APPL 2/3 (GAUZE/BANDAGES/DRESSINGS) IMPLANT
BINDER BREAST LRG (GAUZE/BANDAGES/DRESSINGS) IMPLANT
BINDER BREAST MEDIUM (GAUZE/BANDAGES/DRESSINGS) IMPLANT
BINDER BREAST XLRG (GAUZE/BANDAGES/DRESSINGS) ×2 IMPLANT
BINDER BREAST XXLRG (GAUZE/BANDAGES/DRESSINGS) IMPLANT
BLADE HEX COATED 2.75 (ELECTRODE) ×2 IMPLANT
BLADE SURG 10 STRL SS (BLADE) IMPLANT
BLADE SURG 15 STRL LF DISP TIS (BLADE) ×1 IMPLANT
BLADE SURG 15 STRL SS (BLADE) ×1
CANISTER SUC SOCK COL 7IN (MISCELLANEOUS) IMPLANT
CANISTER SUCT 1200ML W/VALVE (MISCELLANEOUS) ×2 IMPLANT
CHLORAPREP W/TINT 26ML (MISCELLANEOUS) ×2 IMPLANT
CLIP APPLIE 11 MED OPEN (CLIP) ×1 IMPLANT
CLIP APPLIE 9.375 MED OPEN (MISCELLANEOUS) IMPLANT
COVER BACK TABLE 60X90IN (DRAPES) ×2 IMPLANT
COVER MAYO STAND STRL (DRAPES) ×2 IMPLANT
COVER PROBE W GEL 5X96 (DRAPES) ×2 IMPLANT
DECANTER SPIKE VIAL GLASS SM (MISCELLANEOUS) IMPLANT
DERMABOND ADVANCED (GAUZE/BANDAGES/DRESSINGS) ×1
DERMABOND ADVANCED .7 DNX12 (GAUZE/BANDAGES/DRESSINGS) ×1 IMPLANT
DEVICE DUBIN W/COMP PLATE 8390 (MISCELLANEOUS) ×2 IMPLANT
DRAIN CHANNEL 19F RND (DRAIN) IMPLANT
DRAIN HEMOVAC 1/8 X 5 (WOUND CARE) IMPLANT
DRAPE LAPAROSCOPIC ABDOMINAL (DRAPES) ×2 IMPLANT
DRAPE UTILITY XL STRL (DRAPES) ×2 IMPLANT
DRSG PAD ABDOMINAL 8X10 ST (GAUZE/BANDAGES/DRESSINGS) IMPLANT
ELECT REM PT RETURN 9FT ADLT (ELECTROSURGICAL) ×2
ELECTRODE REM PT RTRN 9FT ADLT (ELECTROSURGICAL) ×1 IMPLANT
EVACUATOR SILICONE 100CC (DRAIN) IMPLANT
GAUZE 4X4 16PLY RFD (DISPOSABLE) IMPLANT
GAUZE SPONGE 4X4 12PLY STRL (GAUZE/BANDAGES/DRESSINGS) ×2 IMPLANT
GAUZE SPONGE 4X4 12PLY STRL LF (GAUZE/BANDAGES/DRESSINGS) IMPLANT
GLOVE BIOGEL PI IND STRL 6.5 (GLOVE) ×1 IMPLANT
GLOVE BIOGEL PI IND STRL 7.0 (GLOVE) ×1 IMPLANT
GLOVE BIOGEL PI INDICATOR 6.5 (GLOVE) ×1
GLOVE BIOGEL PI INDICATOR 7.0 (GLOVE) ×1
GLOVE EUDERMIC 7 POWDERFREE (GLOVE) ×2 IMPLANT
GLOVE SURG SS PI 6.5 STRL IVOR (GLOVE) ×2 IMPLANT
GOWN STRL REUS W/ TWL LRG LVL3 (GOWN DISPOSABLE) ×1 IMPLANT
GOWN STRL REUS W/ TWL XL LVL3 (GOWN DISPOSABLE) ×1 IMPLANT
GOWN STRL REUS W/TWL LRG LVL3 (GOWN DISPOSABLE) ×1
GOWN STRL REUS W/TWL XL LVL3 (GOWN DISPOSABLE) ×1
ILLUMINATOR WAVEGUIDE N/F (MISCELLANEOUS) IMPLANT
KIT MARKER MARGIN INK (KITS) ×2 IMPLANT
LIGHT WAVEGUIDE WIDE FLAT (MISCELLANEOUS) IMPLANT
NEEDLE HYPO 25X1 1.5 SAFETY (NEEDLE) ×2 IMPLANT
NS IRRIG 1000ML POUR BTL (IV SOLUTION) ×2 IMPLANT
PACK BASIN DAY SURGERY FS (CUSTOM PROCEDURE TRAY) ×2 IMPLANT
PAD ALCOHOL SWAB (MISCELLANEOUS) ×2 IMPLANT
PENCIL BUTTON HOLSTER BLD 10FT (ELECTRODE) ×2 IMPLANT
PIN SAFETY STERILE (MISCELLANEOUS) IMPLANT
SHEET MEDIUM DRAPE 40X70 STRL (DRAPES) IMPLANT
SLEEVE SCD COMPRESS KNEE MED (MISCELLANEOUS) ×2 IMPLANT
SPONGE LAP 18X18 RF (DISPOSABLE) IMPLANT
SPONGE LAP 4X18 RFD (DISPOSABLE) ×2 IMPLANT
STRIP CLOSURE SKIN 1/2X4 (GAUZE/BANDAGES/DRESSINGS) IMPLANT
SUT ETHILON 3 0 FSL (SUTURE) IMPLANT
SUT MNCRL AB 4-0 PS2 18 (SUTURE) ×4 IMPLANT
SUT SILK 2 0 SH (SUTURE) ×4 IMPLANT
SUT VIC AB 2-0 CT1 27 (SUTURE)
SUT VIC AB 2-0 CT1 TAPERPNT 27 (SUTURE) IMPLANT
SUT VIC AB 3-0 SH 27 (SUTURE) ×2
SUT VIC AB 3-0 SH 27X BRD (SUTURE) ×1 IMPLANT
SUT VICRYL 3-0 CR8 SH (SUTURE) ×2 IMPLANT
SYR 10ML LL (SYRINGE) ×2 IMPLANT
TOWEL GREEN STERILE FF (TOWEL DISPOSABLE) ×2 IMPLANT
TOWEL OR NON WOVEN STRL DISP B (DISPOSABLE) ×2 IMPLANT
TUBE CONNECTING 20X1/4 (TUBING) ×2 IMPLANT
YANKAUER SUCT BULB TIP NO VENT (SUCTIONS) ×2 IMPLANT

## 2017-11-08 NOTE — Anesthesia Procedure Notes (Signed)
Procedure Name: LMA Insertion Date/Time: 11/08/2017 10:56 AM Performed by: Willa Frater, CRNA Pre-anesthesia Checklist: Patient identified, Emergency Drugs available, Suction available and Patient being monitored Patient Re-evaluated:Patient Re-evaluated prior to induction Oxygen Delivery Method: Circle system utilized Preoxygenation: Pre-oxygenation with 100% oxygen Induction Type: IV induction Ventilation: Mask ventilation without difficulty LMA: LMA inserted LMA Size: 4.0 Number of attempts: 1 Airway Equipment and Method: Bite block Placement Confirmation: positive ETCO2 Tube secured with: Tape Dental Injury: Teeth and Oropharynx as per pre-operative assessment

## 2017-11-08 NOTE — Anesthesia Postprocedure Evaluation (Signed)
Anesthesia Post Note  Patient: Michele [name]  Procedure(s) Performed: LEFT BREAST LUMPECTOMY WITH RADIOACTIVE SEED LOCALIZATION X 2 (Left Breast) LEFT BREAST SUBAREOLAR BIOPSY WITH NEEDLE LOCALIZATION (Left Breast)     Patient location during evaluation: PACU Anesthesia Type: General Level of consciousness: awake and alert Pain management: pain level controlled Vital Signs Assessment: post-procedure vital signs reviewed and stable Respiratory status: spontaneous breathing, nonlabored ventilation, respiratory function stable and patient connected to nasal cannula oxygen Cardiovascular status: blood pressure returned to baseline and stable Postop Assessment: no apparent nausea or vomiting Anesthetic complications: no    Last Vitals:  Vitals:   11/08/17 1245 11/08/17 1300  BP: 132/61 (!) 113/52  Pulse: 73 70  Resp: 16 15  Temp:    SpO2: 100% 97%    Last Pain:  Vitals:   11/08/17 1315  TempSrc:   PainSc: 3                  [place]

## 2017-11-08 NOTE — Transfer of Care (Signed)
Immediate Anesthesia Transfer of Care Note  Patient: Michele [name]  Procedure(s) Performed: LEFT BREAST LUMPECTOMY WITH RADIOACTIVE SEED LOCALIZATION X 2 (Left Breast) LEFT BREAST SUBAREOLAR BIOPSY WITH NEEDLE LOCALIZATION (Left Breast)  Patient Location: PACU  Anesthesia Type:General  Level of Consciousness: awake, alert  and oriented  Airway & Oxygen Therapy: Patient Spontanous Breathing and Patient connected to face mask oxygen  Post-op Assessment: Report given to RN and Post -op Vital signs reviewed and stable  Post vital signs: Reviewed and stable  Last Vitals:  Vitals Value Taken Time  BP    Temp    Pulse 84 11/08/2017 12:13 PM  Resp    SpO2 100 % 11/08/2017 12:13 PM  Vitals shown include unvalidated device data.  Last Pain:  Vitals:   11/08/17 0950  TempSrc: Oral  PainSc: 0-No pain         Complications: No apparent anesthesia complications

## 2017-11-08 NOTE — Op Note (Signed)
Patient Name:           Michele [name]   Date of Surgery:        11/08/2017  Pre op Diagnosis:      Multifocal ductal carcinoma in situ, left breast, upper outer quadrant, ER negative                                       Retroareolar calcifications, suspicious  Post op Diagnosis:    Same  Procedure:                 Left breast lumpectomy x2 with radioactive seed localization x2 and margin assessment                                      Left breast retroareolar lumpectomy with wire localization and margin assessment  Surgeon:                     Edsel Petrin. Dalbert Batman, M.D., FACS  Assistant:                      OR staff  Operative Indications:   This is a pleasant, healthy 77 year old female, referred by Dr. Jeanmarie Plant at the [facility] for evaluation and management of multifocal ductal carcinoma in situ left breast as well as suspicious calcifications in the retroareolar area. Dr. Deland Pretty is her PCP. She is seen in the [facility] recently  by Dr. Lindi Adie, Dr. Lisbeth Renshaw and me. Her husband was with her throughout the encounter.     She has no prior history of breast problems. Gets screening mammograms about every other year. Recent mammograms showed category B density. There are 2 groups of suspicious calcifications in the left breast, upper outer quadrant, 2 cm apart. The superior area is 1.5 cm in diameter and the inferior area is 1.2 cm. Both areas were biopsied. Both showed high grade ductal carcinoma in situ. Estrogen and progesterone receptor negative.     There were also 2 areas of suspicious linear calcifications in the left retroareolar area. A deeper area was biopsied and showed fibrocystic changes. A more superficial area could not be biopsied because of its superficial location She is strongly motivated for breast conservation and I think we can try to do that. Family history is negative for ovarian or prostate cancer. 2 half sisters had breast cancer.      We had a long talk. She is not  interested in mastectomy. I told her that we would try to perform left breast lumpectomy 2 with radioactive seed localization 2 to get the areas of DCIS out of her upper outer quadrant left breast. Hopefully this can be a single incision. To excise the subareolar calcifications we are going to plan wire localization because seen would be very superficial and likely to be dislodged and/or lost. She is in favor of this approach She will be scheduled for left breast lumpectomy 2 with radioactive seed localization 2 and excision left retroareolar calcifications with wire localization. . She agrees with this plan.  Operative Findings:       In the left breast upper outer quadrant I made a single curvilinear incision and the specimen was quite generous and contained both seeds and marker clips.  They seem to be centered within the  specimen.  The wire localization entered the lateral areolar margin and we made a circumareolar incision at the areolar margin and took the tissue out behind the nipple and areola.  The specimen mammogram showed the wire was intact in the area of concern was completely excised.  Both of the specimens were marked for margins.  Procedure in Detail:          Following the induction of general LMA anesthesia the patient's left breast was prepped and draped in a sterile fashion.  Intravenous antibiotics were given.  Surgical timeout was performed.  0.5% Marcaine with epinephrine was used as local infiltration anesthetic.       Using the neoprobe I found and isolated the areas of I-125 radioactive seeds.  I planned a curvilinear incision in the lateral left breast.  This incision was made with a knife.  Generous lumpectomy was performed using electrocautery and the neoprobe.  The specimen was removed.  Marked with silk sutures and a 6 color ink kit.  The specimen mammogram looked good as described above.  This wound was irrigated.  Hemostasis was excellent.  5 metal marker clips were  placed in the walls of the lumpectomy cavity.  The lumpectomy tissues were closed with 3-0 Vicryl sutures and the skin closed with a running subcuticular 4-0 Monocryl and Dermabond.     A second incision was made at the areolar margin laterally.  The wire was present.  Dissection was carried superficially under the nipple and areola and the lumpectomy was performed using electrocautery.  The specimen was removed and the specimen mammogram looked good containing the entire wire in the area of concern.  Hemostasis was excellent.  Tissues were irrigated.  The breast tissues were reapproximated with interrupted sutures of 3-0 Vicryl and the skin closed with a running subcuticular 4-0 Monocryl and Dermabond.  Dry bandages and a breast binder were placed.  The patient tolerated the procedure well and was taken to PACU in stable condition.  EBL 20 cc or less.  Counts correct.  Complications none.   Addendum: I logged onto the [employer] and reviewed her prescription medication history.           Edsel Petrin. Dalbert Batman, M.D., FACS General and Minimally Invasive Surgery Breast and Colorectal Surgery  11/08/2017 12:09 PM

## 2017-11-08 NOTE — Anesthesia Postprocedure Evaluation (Signed)
Anesthesia Post Note  Patient: BRITTNAY PIGMAN  Procedure(s) Performed: LEFT BREAST LUMPECTOMY WITH RADIOACTIVE SEED LOCALIZATION X 2 (Left Breast) LEFT BREAST SUBAREOLAR BIOPSY WITH NEEDLE LOCALIZATION (Left Breast)     Patient location during evaluation: PACU Anesthesia Type: General Level of consciousness: awake and alert Pain management: pain level controlled Vital Signs Assessment: post-procedure vital signs reviewed and stable Respiratory status: spontaneous breathing, nonlabored ventilation, respiratory function stable and patient connected to nasal cannula oxygen Cardiovascular status: blood pressure returned to baseline and stable Postop Assessment: no apparent nausea or vomiting Anesthetic complications: no    Last Vitals:  Vitals:   11/08/17 1245 11/08/17 1300  BP: 132/61 (!) 113/52  Pulse: 73 70  Resp: 16 15  Temp:    SpO2: 100% 97%    Last Pain:  Vitals:   11/08/17 1315  TempSrc:   PainSc: 3                  Barnet Glasgow

## 2017-11-08 NOTE — Interval H&P Note (Signed)
History and Physical Interval Note:  11/08/2017 10:31 AM  Michele [name]  has presented today for surgery, with the diagnosis of MULTIFOCAL DUCTAL CARCINOMA IN SITU LEFT BREAST, SUSPICIOUS CALCIFICATIONS,LEFT BREAST RETROAREOLAR AREA  The various methods of treatment have been discussed with the patient and family. After consideration of risks, benefits and other options for treatment, the patient has consented to  Procedure(s): LEFT BREAST LUMPECTOMY WITH RADIOACTIVE SEED LOCALIZATION X'S 2 (Left) LEFT BREAST SUBAREOLAR BIOPSY WITH NEEDLE LOCALIZATION (Left) as a surgical intervention .  The patient's history has been reviewed, patient examined, no change in status, stable for surgery.  I have reviewed the patient's chart and labs.  Questions were answered to the patient's satisfaction.     Adin Hector

## 2017-11-08 NOTE — Discharge Instructions (Signed)
[place] Office Phone Number [phone]  BREAST BIOPSY/ PARTIAL MASTECTOMY: POST OP INSTRUCTIONS  Always review your discharge instruction sheet given to you by the facility where your surgery was performed.  IF YOU HAVE DISABILITY OR FAMILY LEAVE FORMS, YOU MUST BRING THEM TO THE OFFICE FOR PROCESSING.  DO NOT GIVE THEM TO YOUR DOCTOR.  1. A prescription for pain medication may be given to you upon discharge.  Take your pain medication as prescribed, if needed.  If narcotic pain medicine is not needed, then you may take acetaminophen (Tylenol) or ibuprofen (Advil) as needed. No Tylenol until 4:00pm! 2. Take your usually prescribed medications unless otherwise directed 3. If you need a refill on your pain medication, please contact your pharmacy.  They will contact our office to request authorization.  Prescriptions will not be filled after 5pm or on week-ends. 4. You should eat very light the first 24 hours after surgery, such as soup, crackers, pudding, etc.  Resume your normal diet the day after surgery. 5. Most patients will experience some swelling and bruising in the breast.  Ice packs and a good support bra will help.  Swelling and bruising can take several days to resolve.  6. It is common to experience some constipation if taking pain medication after surgery.  Increasing fluid intake and taking a stool softener will usually help or prevent this problem from occurring.  A mild laxative (Milk of Magnesia or Miralax) should be taken according to package directions if there are no bowel movements after 48 hours. 7. Unless discharge instructions indicate otherwise, you may remove your bandages 24-48 hours after surgery, and you may shower at that time.  You may have steri-strips (small skin tapes) in place directly over the incision.  These strips should be left on the skin for 7-10 days.  If your surgeon used skin glue on the incision, you may shower in 24 hours.  The glue  will flake off over the next 2-3 weeks.  Any sutures or staples will be removed at the office during your follow-up visit. 8. ACTIVITIES:  You may resume regular daily activities (gradually increasing) beginning the next day.  Wearing a good support bra or sports bra minimizes pain and swelling.  You may have sexual intercourse when it is comfortable. a. You may drive when you no longer are taking prescription pain medication, you can comfortably wear a seatbelt, and you can safely maneuver your car and apply brakes. b. RETURN TO WORK:  ______________________________________________________________________________________ 9. You should see your doctor in the office for a follow-up appointment approximately two weeks after your surgery.  Your doctors nurse will typically make your follow-up appointment when she calls you with your pathology report.  Expect your pathology report 2-3 business days after your surgery.  You may call to check if you do not hear from [place] after three days. 10. OTHER INSTRUCTIONS: _______________________________________________________________________________________________ _____________________________________________________________________________________________________________________________________ _____________________________________________________________________________________________________________________________________ _____________________________________________________________________________________________________________________________________  WHEN TO CALL YOUR DOCTOR: 1. Fever over 101.0 2. Nausea and/or vomiting. 3. Extreme swelling or bruising. 4. Continued bleeding from incision. 5. Increased pain, redness, or drainage from the incision.  The clinic staff is available to answer your questions during regular business hours.  Please dont hesitate to call and ask to speak to one of the nurses for clinical concerns.  If you have a medical  emergency, go to the nearest emergency room or call 911.  A surgeon from [facility] is always on call at the hospital.  For further questions,  please visit [email or web address]     Post Anesthesia Home Care Instructions  Activity: Get plenty of rest for the remainder of the day. A responsible individual must stay with you for 24 hours following the procedure.  For the next 24 hours, DO NOT: -Drive a car -Paediatric nurse -Drink alcoholic beverages -Take any medication unless instructed by your physician -Make any legal decisions or sign important papers.  Meals: Start with liquid foods such as gelatin or soup. Progress to regular foods as tolerated. Avoid greasy, spicy, heavy foods. If nausea and/or vomiting occur, drink only clear liquids until the nausea and/or vomiting subsides. Call your physician if vomiting continues.  Special Instructions/Symptoms: Your throat may feel dry or sore from the anesthesia or the breathing tube placed in your throat during surgery. If this causes discomfort, gargle with warm salt water. The discomfort should disappear within 24 hours.  If you had a scopolamine patch placed behind your ear for the management of post- operative nausea and/or vomiting:  1. The medication in the patch is effective for 72 hours, after which it should be removed.  Wrap patch in a tissue and discard in the trash. Wash hands thoroughly with soap and water. 2. You may remove the patch earlier than 72 hours if you experience unpleasant side effects which may include dry mouth, dizziness or visual disturbances. 3. Avoid touching the patch. Wash your hands with soap and water after contact with the patch.

## 2017-11-11 ENCOUNTER — Encounter (HOSPITAL_BASED_OUTPATIENT_CLINIC_OR_DEPARTMENT_OTHER): Payer: Self-pay | Admitting: General Surgery

## 2017-11-11 NOTE — Addendum Note (Signed)
Addendum  created 11/11/17 1144 by Tawni Millers, CRNA   [employer] section accepted

## 2017-11-19 ENCOUNTER — Inpatient Hospital Stay: Payer: Medicare Other | Attending: Hematology and Oncology | Admitting: Hematology and Oncology

## 2017-11-19 DIAGNOSIS — Z171 Estrogen receptor negative status [ER-]: Secondary | ICD-10-CM | POA: Diagnosis not present

## 2017-11-19 DIAGNOSIS — D0512 Intraductal carcinoma in situ of left breast: Secondary | ICD-10-CM | POA: Diagnosis present

## 2017-11-19 NOTE — Assessment & Plan Note (Signed)
11/08/2017:Two left lumpectomies: DCIS high-grade 0.9 cm focally less than 0.1 cm to medial margin; second lumpectomy left subareolar: DCIS with calcifications high-grade 1.2 cm focally less than 0.1 cm superior margin and focally,  0.1 cm to anterior margin ER 0%, PR 0% for both Tis NX stage 0  Pathology counseling: I discussed the final pathology report of the patient provided  a copy of this report. I discussed the margins  We also discussed the final staging along with previously performed ER/PR and  testing.  Recommendation: 1.  Resection of margins versus adjuvant radiation: We need to discuss if the tumor board 2. no role of antiestrogen therapy because she is here PR negative.  Return to clinic in 6 months for surveillance and follow-up for survivorship care plan visit

## 2017-11-19 NOTE — Progress Notes (Signed)
Patient Care Team: Deland Pretty, MD as PCP - General (Internal Medicine) [name], MD as Consulting Physician (General Surgery) Nicholas Lose, MD as Consulting Physician (Hematology and Oncology) Kyung Rudd, MD as Consulting Physician (Radiation Oncology)  DIAGNOSIS:  Encounter Diagnosis  Name Primary?  . Ductal carcinoma in situ (DCIS) of left breast     SUMMARY OF ONCOLOGIC HISTORY:   Ductal carcinoma in situ (DCIS) of left breast   09/30/2017 Initial Diagnosis    Screening detected left breast calcifications 2 groups upper outer quadrant 1.5 cm and 1.2 cm both of which are biopsy-proven high-grade DCIS with calcifications ER PR negative.  Underneath the nipple 2 groups of calcifications.  Posterior group was biopsy-proven fibrocystic change anterior group could not be biopsied.  Tis NX stage 0    11/08/2017 Surgery    2 left lumpectomies: DCIS high-grade 0.9 cm focally less than 0.1 cm to medial margin; second lumpectomy left subareolar: DCIS with calcifications high-grade 1.2 cm focally less than 0.1 cm superior margin and focally,  0.1 cm to anterior margin ER 0%, PR 0% for both Tis NX stage 0     CHIEF COMPLIANT: Follow-up after recent lumpectomies  INTERVAL HISTORY: Michele [name] is a 77 year old with above-mentioned history of DCIS left breast ER PR negative underwent 2 lumpectomies and is here today to discuss pathology report.  She is healing recovering very well from the recent surgery.  Denies any pain or discomfort at this time.  REVIEW OF SYSTEMS:   Constitutional: Denies fevers, chills or abnormal weight loss Eyes: Denies blurriness of vision Ears, nose, mouth, throat, and face: Denies mucositis or sore throat Respiratory: Denies cough, dyspnea or wheezes Cardiovascular: Denies palpitation, chest discomfort Gastrointestinal:  Denies nausea, heartburn or change in bowel habits Skin: Denies abnormal skin rashes Lymphatics: Denies new lymphadenopathy or easy  bruising Neurological:Denies numbness, tingling or new weaknesses Behavioral/Psych: Mood is stable, no new changes  Extremities: No lower extremity edema Breast: Recent double lumpectomies All other systems were reviewed with the patient and are negative.  I have reviewed the past medical history, past surgical history, social history and family history with the patient and they are unchanged from previous note.  ALLERGIES:  is allergic to azithromycin; influenza vaccines; penicillins; and reglan [metoclopramide].  MEDICATIONS:  Current Outpatient Medications  Medication Sig Dispense Refill  . Cholecalciferol (VITAMIN D3) 1000 units CAPS Take by mouth daily.    . Citalopram Hydrobromide (CELEXA PO) Take 10 mg by mouth as needed.    . Coenzyme Q10 (COQ10 PO) Take by mouth daily.    . fluticasone (VERAMYST) 27.5 MCG/SPRAY nasal spray Place 2 sprays into the nose daily.    Marland Kitchen levocetirizine (XYZAL) 5 MG tablet Take 5 mg by mouth every evening.    . traMADol (ULTRAM) 50 MG tablet Take 1 tablet (50 mg total) by mouth every 6 (six) hours as needed for moderate pain or severe pain. 15 tablet 0  . TURMERIC PO Take by mouth daily.     No current facility-administered medications for this visit.     PHYSICAL EXAMINATION: ECOG PERFORMANCE STATUS: 1 - Symptomatic but completely ambulatory  Vitals:   11/19/17 1115  BP: 137/74  Pulse: 81  Resp: 18  Temp: 98.4 F (36.9 C)  SpO2: 98%   Filed Weights   11/19/17 1115  Weight: 158 lb 8 oz (71.9 kg)    GENERAL:alert, no distress and comfortable SKIN: skin color, texture, turgor are normal, no rashes or significant lesions EYES:  normal, Conjunctiva are pink and non-injected, sclera clear OROPHARYNX:no exudate, no erythema and lips, buccal mucosa, and tongue normal  NECK: supple, thyroid normal size, non-tender, without nodularity LYMPH:  no palpable lymphadenopathy in the cervical, axillary or inguinal LUNGS: clear to auscultation and  percussion with normal breathing effort HEART: regular rate & rhythm and no murmurs and no lower extremity edema ABDOMEN:abdomen soft, non-tender and normal bowel sounds MUSCULOSKELETAL:no cyanosis of digits and no clubbing  NEURO: alert & oriented x 3 with fluent speech, no focal motor/sensory deficits EXTREMITIES: No lower extremity edema   LABORATORY DATA:  I have reviewed the data as listed CMP Latest Ref Rng & Units 10/09/2017 11/17/2014  Glucose 70 - 99 mg/dL 113(H) 94  BUN 8 - 23 mg/dL 15 13  Creatinine 0.44 - 1.00 mg/dL 0.88 1.00  Sodium 135 - 145 mmol/L 141 139  Potassium 3.5 - 5.1 mmol/L 4.7 4.9  Chloride 98 - 111 mmol/L 102 97  CO2 22 - 32 mmol/L 28 -  Calcium 8.9 - 10.3 mg/dL 9.2 9.6  Total Protein 6.5 - 8.1 g/dL 7.4 6.9  Total Bilirubin 0.3 - 1.2 mg/dL 0.6 0.5  Alkaline Phos 38 - 126 U/L 55 51  AST 15 - 41 U/L 16 14  ALT 0 - 44 U/L 10 -    Lab Results  Component Value Date   WBC 5.9 10/09/2017   HGB 14.3 10/09/2017   HCT 43.4 10/09/2017   MCV 94.9 10/09/2017   PLT 259 10/09/2017   NEUTROABS 3.7 10/09/2017    ASSESSMENT & PLAN:  Ductal carcinoma in situ (DCIS) of left breast 11/08/2017:Two left lumpectomies: DCIS high-grade 0.9 cm focally less than 0.1 cm to medial margin; second lumpectomy left subareolar: DCIS with calcifications high-grade 1.2 cm focally less than 0.1 cm superior margin and focally,  0.1 cm to anterior margin ER 0%, PR 0% for both Tis NX stage 0  Pathology counseling: I discussed the final pathology report of the patient provided  a copy of this report. I discussed the margins  We also discussed the final staging along with previously performed ER/PR and  testing.  Recommendation: 1.  Resection of margins versus adjuvant radiation: We need to discuss if the tumor board 2. no role of antiestrogen therapy because she is ER and PR negative.  I discussed at length the issue regarding margins.  The margins are focally close. Based upon [facility] and  DCIS nomogram, patient risk of recurrence if nothing further is done is a 24% over 10 years which will be brought down to 10% with radiation. If she gets re-resection of the margins and margins are negative then her 10-year risk of recurrence would be 15% and with radiation to be brought down to 6%.  We will discuss her case in the breast tumor board tomorrow and make a final determination.      No orders of the defined types were placed in this encounter.  The patient has a good understanding of the overall plan. she agrees with it. she will call with any problems that may develop before the next visit here.   Harriette Ohara, MD 11/19/17

## 2017-11-21 NOTE — Progress Notes (Signed)
Location of Breast Cancer:DuctDuctal carcinoma in situ (DCIS) of left breast   Histology per Pathology Report:  Diagnosis 09-30-17 Breast, left, needle core biopsy, central anterior - FIBROCYSTIC CHANGES WITH CALCIFICATIONS. - THERE IS NO EVIDENCE OF MALIGNANCY. - SEE COMMENT. Microscopic Comment The results were called to the [facility] on 10/01/17. (JBK:kh  Diagnosis 09-25-17 1. Breast, left, needle core biopsy, upper outer superior - DUCTAL CARCINOMA IN SITU WITH CALCIFICATIONS. - SEE COMMENT. 2. Breast, left, needle core biopsy, upper outer inferior - DUCTAL CARCINOMA IN SITU WITH CALCIFICATIONS. - SEE COMMENT. Microscopic Comment 1. and 2. The carcinoma in the two specimens is similar and is high grade. Estrogen receptor and progesterone receptor studies will be performed on part 1 and the results reported separately. The results were called to [facility] on 09/26/17. (JBK:gt, 09/26/17)   Receptor Status: ER(0 % -), PR (0 %-), Her2-neu (), Ki-()  Did patient present with symptoms (if so, please note symptoms) or was this found on screening mammography?: Screening detected left breast calcifications 2 groups upper outer quadrant   Past/Anticipated interventions by surgeon, if any:   Diagnosis 11-08-17 Dr. [name] 1. Breast, lumpectomy, Left with seed - DUCTAL CARCINOMA IN SITU WITH CALCIFICATIONS, HIGH GRADE, SPANNING 0.9 CM. - DUCTAL CARCINOMA IN SITU IS FOCALLY LESS THAN 0.1 CM TO THE MEDIAL MARGIN OF SPECIMEN 1. - SEE ONCOLOGY TABLE BELOW. 2. Breast, lumpectomy, Left subareolar - DUCTAL CARCINOMA IN SITU WITH CALCIFICATIONS, HIGH GRADE, SPANNING 1.2 CM. - DUCTAL CARCINOMA IN SITU IS FOCALLY LESS THAN 0.1 CM TO THE SUPERIOR MARGIN AND FOCALLY 0.1 CM TO THE ANTERIOR MARGIN. - SEE ONCOLOGY TABLE BELOW. Microscopic Comment 1. DCIS OF THE BREAST: Resection Procedure: Seed localized lumpectomy Specimen Laterality: Left  Receptor  Status: ER(0%), PR (0%), Her2-neu (), Ki-()  Past/Anticipated interventions by medical oncology, if any:Dr. Lindi Adie   Chemotherapy No  Resection of margins versus adjuvant radiation Radiation referral No role of antiestrogen therapy because she is here PR negative  Lymphedema issues, if any: No   ROM to left arm good. Skin to left arm looks good healing  Without signs of infection on the incision areas of her breast. Follow up appointment to see Dr. [name] 11-22-17.  Pain issues, if any: No  SAFETY ISSUES:  Prior radiation? :No  Pacemaker/ICD? :No  Possible current pregnancy?:No  Is the patient on methotrexate? :No  Menarche 12 G2 P2 BC yes LMP Menopause 50  HRT Yes 1 1/2 year  Current Complaints / other details: Breast cancer half sister    Wt Readings from Last 3 Encounters:  11/26/17 159 lb 6.4 oz (72.3 kg)  11/19/17 158 lb 8 oz (71.9 kg)  11/08/17 159 lb 13.3 oz (72.5 kg)  BP (!) 144/59 (BP Location: Right Leg, Patient Position: Sitting)   Pulse 74   Temp 98 F (36.7 C) (Oral)   Resp 20   Ht _0  (1.626 m)   Wt 159 lb 6.4 oz (72.3 kg)   SpO2 99%   BMI 27.36 kg/m   Georgena Spurling, RN 11/21/2017,4:13 PM

## 2017-11-26 ENCOUNTER — Other Ambulatory Visit: Payer: Self-pay

## 2017-11-26 ENCOUNTER — Ambulatory Visit
Admission: RE | Admit: 2017-11-26 | Discharge: 2017-11-26 | Disposition: A | Discharge status: Home / Self Care | Payer: Medicare Other | Source: Ambulatory Visit | Attending: Radiation Oncology | Admitting: Radiation Oncology

## 2017-11-26 ENCOUNTER — Ambulatory Visit: Admission: RE | Admit: 2017-11-26 | Payer: Medicare Other | Source: Ambulatory Visit | Admitting: Radiation Oncology

## 2017-11-26 ENCOUNTER — Encounter: Payer: Self-pay | Admitting: Radiation Oncology

## 2017-11-26 VITALS — BP 144/59 | HR 74 | Temp 98.0°F | Resp 20 | Ht 64.0 in | Wt 159.4 lb

## 2017-11-26 DIAGNOSIS — D0512 Intraductal carcinoma in situ of left breast: Secondary | ICD-10-CM

## 2017-11-26 DIAGNOSIS — Z87891 Personal history of nicotine dependence: Secondary | ICD-10-CM | POA: Diagnosis not present

## 2017-11-26 DIAGNOSIS — Z79899 Other long term (current) drug therapy: Secondary | ICD-10-CM | POA: Diagnosis not present

## 2017-11-26 DIAGNOSIS — Z880 Allergy status to penicillin: Secondary | ICD-10-CM | POA: Insufficient documentation

## 2017-11-26 DIAGNOSIS — Z888 Allergy status to other drugs, medicaments and biological substances status: Secondary | ICD-10-CM | POA: Insufficient documentation

## 2017-11-26 DIAGNOSIS — Z881 Allergy status to other antibiotic agents status: Secondary | ICD-10-CM | POA: Diagnosis not present

## 2017-11-26 DIAGNOSIS — Z171 Estrogen receptor negative status [ER-]: Secondary | ICD-10-CM | POA: Insufficient documentation

## 2017-11-26 DIAGNOSIS — Z510 Encounter for antineoplastic radiation therapy: Secondary | ICD-10-CM | POA: Insufficient documentation

## 2017-11-26 NOTE — Progress Notes (Addendum)
Radiation Oncology         [phone] ________________________________  Name: Michele [name]        MRN: [id number]  Date of Service: 11/26/2017 DOB: 1940-12-14  VE:LFYBO, Thayer Jew, MD  Nicholas Lose, MD     REFERRING PHYSICIAN: Nicholas Lose, MD   DIAGNOSIS: The encounter diagnosis was Ductal carcinoma in situ (DCIS) of left breast.   HISTORY OF PRESENT ILLNESS: Michele [name] is a 77 y.o. female originally seen in the multidisciplinary breast clinic for a new diagnosis of left breast cancer. The patient was noted to have a group of calcifications of the left breast on screening mammography. This prompted diagnostic imaging which revealed 2 areas of calcification, a 1.5 cm span and a 1.2 cm span and on tomo imaging anterior to this and immediately behind the nipple was another area of calcifications that was not sampled as well as a fourth area also anterior to the other biopsied sites that was sampled. The two lesions in the upper outer quadrant revealed high grade DCIS with calcifications, ER/PR positive. The more anterior lesion was biopsied on 09/30/17 and was consistent with a benign fibrocystic changes.  She subsequently underwent two lumpectomies on 11/08/17. The lateral specimen revealed DCIS with calcifications, high-grade spanning 9 mm, focally less than 1 mm to the medial margin, and of the subareolar specimen, DCIS with calcifications was also noted spanning 1.2 cm and was also high-grade.  The DCIS was focally less than 1 mm to the superior margin and focally 1 mm to the anterior margin.  Again her tumor was ER PR negative, and she comes today to discuss the rationale for adjuvant therapy with radiation.  PREVIOUS RADIATION THERAPY: No   PAST MEDICAL HISTORY:  Past Medical History:  Diagnosis Date  . Anxiety   . Asthma    only as a child; went away by age [age]  . Depression   . GERD (gastroesophageal reflux disease)   . Seasonal allergies        PAST SURGICAL  HISTORY: Past Surgical History:  Procedure Laterality Date  . BREAST LUMPECTOMY WITH NEEDLE LOCALIZATION Left 11/08/2017   Procedure: LEFT BREAST SUBAREOLAR BIOPSY WITH NEEDLE LOCALIZATION;  Surgeon: [name], MD;  Location: [place];  Service: General;  Laterality: Left;  . BREAST LUMPECTOMY WITH RADIOACTIVE SEED LOCALIZATION Left 11/08/2017   Procedure: LEFT BREAST LUMPECTOMY WITH RADIOACTIVE SEED LOCALIZATION X 2;  Surgeon: [name], MD;  Location: [place];  Service: General;  Laterality: Left;  . BROW LIFT    . CATARACT EXTRACTION       FAMILY HISTORY:  Family History  Problem Relation Age of Onset  . Asthma Mother   . COPD Mother   . Asthma Sister   . COPD Sister   . Breast cancer Sister        half sister     SOCIAL HISTORY:  reports that she quit smoking about 35 years ago. Her smoking use included cigarettes. She started smoking about 61 years ago. She has quit using smokeless tobacco. She reports that she drinks about 2.0 standard drinks of alcohol per week. She reports that she does not use drugs. The patient is married and lives in [place]. She is a Licensed conveyancer.  She also is actively involved in a local university as well.   ALLERGIES: Azithromycin; Influenza vaccines; Penicillins; and Reglan [metoclopramide]   MEDICATIONS:  Current Outpatient Medications  Medication Sig Dispense Refill  . Cholecalciferol (VITAMIN  D3) 1000 units CAPS Take by mouth daily.    . Citalopram Hydrobromide (CELEXA PO) Take 10 mg by mouth as needed.    . Coenzyme Q10 (COQ10 PO) Take by mouth daily.    . TURMERIC PO Take by mouth daily.    . fluticasone (VERAMYST) 27.5 MCG/SPRAY nasal spray Place 2 sprays into the nose daily.    Marland Kitchen levocetirizine (XYZAL) 5 MG tablet Take 5 mg by mouth every evening.     No current facility-administered medications for this encounter.      REVIEW OF SYSTEMS: On review of systems, the patient reports that  she is doing well overall.  She feels as though she is healing up pretty well given her recent surgery. She denies any chest pain, shortness of breath, cough, fevers, chills, night sweats, unintended weight changes. She denies any bowel or bladder disturbances, and denies abdominal pain, nausea or vomiting. She denies any new musculoskeletal or joint aches or pains. A complete review of systems is obtained and is otherwise negative.     PHYSICAL EXAM:  Wt Readings from Last 3 Encounters:  11/26/17 159 lb 6.4 oz (72.3 kg)  11/19/17 158 lb 8 oz (71.9 kg)  11/08/17 159 lb 13.3 oz (72.5 kg)   Temp Readings from Last 3 Encounters:  11/26/17 98 F (36.7 C) (Oral)  11/19/17 98.4 F (36.9 C) (Oral)  11/08/17 97.6 F (36.4 C)   BP Readings from Last 3 Encounters:  11/26/17 (!) 144/59  11/19/17 137/74  11/08/17 132/74   Pulse Readings from Last 3 Encounters:  11/26/17 74  11/19/17 81  11/08/17 82     In general this is a well appearing Caucasian female in no acute distress.  She's alert and oriented x4 and appropriate throughout the examination. Cardiopulmonary assessment is negative for acute distress and she exhibits normal effort.  The left breast is assessed and has 2 well-healing lumpectomy sites erythema, edema or fluctuance.    ECOG = 0  0 - Asymptomatic (Fully active, able to carry on all predisease activities without restriction)  1 - Symptomatic but completely ambulatory (Restricted in physically strenuous activity but ambulatory and able to carry out work of a light or sedentary nature. For example, light housework, office work)  2 - Symptomatic, <50% in bed during the day (Ambulatory and capable of all self care but unable to carry out any work activities. Up and about more than 50% of waking hours)  3 - Symptomatic, >50% in bed, but not bedbound (Capable of only limited self-care, confined to bed or chair 50% or more of waking hours)  4 - Bedbound (Completely disabled.  Cannot carry on any self-care. Totally confined to bed or chair)  5 - Death   Eustace Pen MM, Creech RH, Tormey DC, et al. [phone]). "Toxicity and response criteria of the [facility]". [place] Oncol. 5 (6): 649-55    LABORATORY DATA:  Lab Results  Component Value Date   WBC 5.9 10/09/2017   HGB 14.3 10/09/2017   HCT 43.4 10/09/2017   MCV 94.9 10/09/2017   PLT 259 10/09/2017   Lab Results  Component Value Date   NA 141 10/09/2017   K 4.7 10/09/2017   CL 102 10/09/2017   CO2 28 10/09/2017   Lab Results  Component Value Date   ALT 10 10/09/2017   AST 16 10/09/2017   ALKPHOS 55 10/09/2017   BILITOT 0.6 10/09/2017      RADIOGRAPHY: Mm Breast Surgical Specimen  Result Date: 11/08/2017 CLINICAL DATA:  Evaluate surgical specimen following LEFT breast excision for RETROAREOLAR calcifications. EXAM: SPECIMEN RADIOGRAPH OF THE LEFT BREAST COMPARISON:  Previous exam(s). FINDINGS: Status post excision of the LEFT breast. The wire tip and biopsy marker clip are present and are marked for pathology. IMPRESSION: Specimen radiograph of the LEFT breast. Electronically Signed   By: Margarette Canada M.D.   On: 11/08/2017 11:58   Mm Breast Surgical Specimen  Result Date: 11/08/2017 CLINICAL DATA:  Evaluate surgical specimen following LEFT lumpectomy for DCIS. EXAM: SPECIMEN RADIOGRAPH OF THE LEFT BREAST COMPARISON:  Previous exam(s). FINDINGS: Status post excision of the left breast. The 2 radioactive seeds and COIL biopsy marker clip are present, completely intact, and were marked for pathology. IMPRESSION: Specimen radiograph of the left breast. Electronically Signed   By: Margarette Canada M.D.   On: 11/08/2017 11:46   Mm Lt Radioactive Seed Loc Mammo Guide  Result Date: 11/08/2017 CLINICAL DATA:  77 year old with 2 groups of calcifications in the UPPER OUTER QUADRANT of the LEFT breast at MIDDLE depth, both biopsy-proven high-grade DCIS. Patient also has suspicious calcifications  directly behind the LEFT nipple. Radioactive seed localization of the 2 foci of DCIS in the UPPER OUTER QUADRANT and needle/wire localization of the suspicious calcifications behind the LEFT nipple is performed in anticipation of lumpectomy later today. Of note, the SUPERIOR group of calcifications in the UPPER OUTER QUADRANT are associated with a coil shaped tissue marker clip which is position at the site of the biopsy. The X shaped tissue marker clip placed at the time of biopsy of the INFERIOR group of calcifications in the UPPER OUTER QUADRANT migrated approximately 5 cm and will not be in the specimen. A ribbon shaped tissue marker clip is present in the subareolar location at ANTERIOR depth from a biopsy of benign calcifications. EXAM: MAMMOGRAPHIC GUIDED RADIOACTIVE SEED LOCALIZATION OF THE LEFT BREAST x 2 MAMMOGRAPHIC GUIDED NEEDLE/WIRE LOCALIZATION OF THE LEFT BREAST COMPARISON:  Previous exam(s). FINDINGS: Patient presents for radioactive seed localization prior to LEFT breast lumpectomy. I met with the patient and we discussed the procedure of seed localization including benefits and alternatives. We discussed the high likelihood of a successful procedure. We discussed the risks of the procedure including infection, bleeding, tissue injury and further surgery. We discussed the low dose of radioactivity involved in the procedure. Informed, written consent was given. The usual time-out protocol was performed immediately prior to the procedure. Initially, using mammographic guidance, sterile technique with chlorhexidine as skin antisepsis, 1% lidocaine as local anesthetic, an I-125 radioactive seed was used to localize the SUPERIOR group of calcifications in the UPPER OUTER QUADRANT at MIDDLE depth using a LATERAL approach. Subsequently, an I-125 radioactive seed was used to localize the INFERIOR group of calcifications in the [place] at MIDDLE depth. Finally, using mammographic guidance,  sterile technique with chlorhexidine as skin antisepsis, 1% lidocaine as local anesthetic, a 5 cm modified Kopan's needle was used to localize the calcifications directly behind the LEFT nipple using a LATERAL approach. The wire was placed through the calcifications. The calcifications are approximately 3 cm deep to the skin entry site. The follow-up mammogram images confirm that both seeds and the localization wire are in the expected location and are marked for Dr. Dalbert Batman. The radioactive seeds are approximately 1.7 cm apart. Follow-up survey of the patient confirms presence of the radioactive seeds. Order number of I-125 seed (SUPERIOR group of calcifications): [id number] Total Activity: 0.252 mCi Reference Date: 10/23/2017 Order  number of I-125 (INFERIOR group of calcifications): [id number] Total Activity: 0.252 mCi Reference Date: 10/23/2017 The patient tolerated the procedure well and was released from the [place]. She was given instructions regarding seed removal. The patient is scheduled for surgery at 11 o'clock a.m. today. IMPRESSION: 1. Radioactive seed localization of 2 groups of calcifications involving the UPPER OUTER QUADRANT of the LEFT breast. The seeds are approximately 1.7 cm apart. 2. Wire localization of calcifications directly behind the LEFT nipple. The calcifications are approximately 3 cm deep to the skin entry site. 3. No apparent complications. Electronically Signed   By: Evangeline Dakin M.D.   On: 11/08/2017 09:09   Mm Lt Rad Seed Ea Add Lesion Loc Mammo  Result Date: 11/08/2017 CLINICAL DATA:  76 year old with 2 groups of calcifications in the UPPER OUTER QUADRANT of the LEFT breast at MIDDLE depth, both biopsy-proven high-grade DCIS. Patient also has suspicious calcifications directly behind the LEFT nipple. Radioactive seed localization of the 2 foci of DCIS in the UPPER OUTER QUADRANT and needle/wire localization of the suspicious calcifications behind the LEFT nipple is  performed in anticipation of lumpectomy later today. Of note, the SUPERIOR group of calcifications in the UPPER OUTER QUADRANT are associated with a coil shaped tissue marker clip which is position at the site of the biopsy. The X shaped tissue marker clip placed at the time of biopsy of the INFERIOR group of calcifications in the UPPER OUTER QUADRANT migrated approximately 5 cm and will not be in the specimen. A ribbon shaped tissue marker clip is present in the subareolar location at ANTERIOR depth from a biopsy of benign calcifications. EXAM: MAMMOGRAPHIC GUIDED RADIOACTIVE SEED LOCALIZATION OF THE LEFT BREAST x 2 MAMMOGRAPHIC GUIDED NEEDLE/WIRE LOCALIZATION OF THE LEFT BREAST COMPARISON:  Previous exam(s). FINDINGS: Patient presents for radioactive seed localization prior to LEFT breast lumpectomy. I met with the patient and we discussed the procedure of seed localization including benefits and alternatives. We discussed the high likelihood of a successful procedure. We discussed the risks of the procedure including infection, bleeding, tissue injury and further surgery. We discussed the low dose of radioactivity involved in the procedure. Informed, written consent was given. The usual time-out protocol was performed immediately prior to the procedure. Initially, using mammographic guidance, sterile technique with chlorhexidine as skin antisepsis, 1% lidocaine as local anesthetic, an I-125 radioactive seed was used to localize the SUPERIOR group of calcifications in the UPPER OUTER QUADRANT at MIDDLE depth using a LATERAL approach. Subsequently, an I-125 radioactive seed was used to localize the INFERIOR group of calcifications in the [place] at MIDDLE depth. Finally, using mammographic guidance, sterile technique with chlorhexidine as skin antisepsis, 1% lidocaine as local anesthetic, a 5 cm modified Kopan's needle was used to localize the calcifications directly behind the LEFT nipple using a  LATERAL approach. The wire was placed through the calcifications. The calcifications are approximately 3 cm deep to the skin entry site. The follow-up mammogram images confirm that both seeds and the localization wire are in the expected location and are marked for Dr. Dalbert Batman. The radioactive seeds are approximately 1.7 cm apart. Follow-up survey of the patient confirms presence of the radioactive seeds. Order number of I-125 seed (SUPERIOR group of calcifications): [id number] Total Activity: 0.252 mCi Reference Date: 10/23/2017 Order number of I-125 (INFERIOR group of calcifications): [id number] Total Activity: 0.252 mCi Reference Date: 10/23/2017 The patient tolerated the procedure well and was released from the [place]. She was given instructions regarding seed removal.  The patient is scheduled for surgery at 11 o'clock a.m. today. IMPRESSION: 1. Radioactive seed localization of 2 groups of calcifications involving the UPPER OUTER QUADRANT of the LEFT breast. The seeds are approximately 1.7 cm apart. 2. Wire localization of calcifications directly behind the LEFT nipple. The calcifications are approximately 3 cm deep to the skin entry site. 3. No apparent complications. Electronically Signed   By: Evangeline Dakin M.D.   On: 11/08/2017 09:09   Mm Lt Rad Seed Ea Add Lesion Loc Mammo  Result Date: 11/08/2017 CLINICAL DATA:  77 year old with 2 groups of calcifications in the UPPER OUTER QUADRANT of the LEFT breast at MIDDLE depth, both biopsy-proven high-grade DCIS. Patient also has suspicious calcifications directly behind the LEFT nipple. Radioactive seed localization of the 2 foci of DCIS in the UPPER OUTER QUADRANT and needle/wire localization of the suspicious calcifications behind the LEFT nipple is performed in anticipation of lumpectomy later today. Of note, the SUPERIOR group of calcifications in the UPPER OUTER QUADRANT are associated with a coil shaped tissue marker clip which is position at the  site of the biopsy. The X shaped tissue marker clip placed at the time of biopsy of the INFERIOR group of calcifications in the UPPER OUTER QUADRANT migrated approximately 5 cm and will not be in the specimen. A ribbon shaped tissue marker clip is present in the subareolar location at ANTERIOR depth from a biopsy of benign calcifications. EXAM: MAMMOGRAPHIC GUIDED RADIOACTIVE SEED LOCALIZATION OF THE LEFT BREAST x 2 MAMMOGRAPHIC GUIDED NEEDLE/WIRE LOCALIZATION OF THE LEFT BREAST COMPARISON:  Previous exam(s). FINDINGS: Patient presents for radioactive seed localization prior to LEFT breast lumpectomy. I met with the patient and we discussed the procedure of seed localization including benefits and alternatives. We discussed the high likelihood of a successful procedure. We discussed the risks of the procedure including infection, bleeding, tissue injury and further surgery. We discussed the low dose of radioactivity involved in the procedure. Informed, written consent was given. The usual time-out protocol was performed immediately prior to the procedure. Initially, using mammographic guidance, sterile technique with chlorhexidine as skin antisepsis, 1% lidocaine as local anesthetic, an I-125 radioactive seed was used to localize the SUPERIOR group of calcifications in the UPPER OUTER QUADRANT at MIDDLE depth using a LATERAL approach. Subsequently, an I-125 radioactive seed was used to localize the INFERIOR group of calcifications in the [place] at MIDDLE depth. Finally, using mammographic guidance, sterile technique with chlorhexidine as skin antisepsis, 1% lidocaine as local anesthetic, a 5 cm modified Kopan's needle was used to localize the calcifications directly behind the LEFT nipple using a LATERAL approach. The wire was placed through the calcifications. The calcifications are approximately 3 cm deep to the skin entry site. The follow-up mammogram images confirm that both seeds and the  localization wire are in the expected location and are marked for Dr. Dalbert Batman. The radioactive seeds are approximately 1.7 cm apart. Follow-up survey of the patient confirms presence of the radioactive seeds. Order number of I-125 seed (SUPERIOR group of calcifications): [id number] Total Activity: 0.252 mCi Reference Date: 10/23/2017 Order number of I-125 (INFERIOR group of calcifications): [id number] Total Activity: 0.252 mCi Reference Date: 10/23/2017 The patient tolerated the procedure well and was released from the [place]. She was given instructions regarding seed removal. The patient is scheduled for surgery at 11 o'clock a.m. today. IMPRESSION: 1. Radioactive seed localization of 2 groups of calcifications involving the UPPER OUTER QUADRANT of the LEFT breast. The seeds are approximately 1.7  cm apart. 2. Wire localization of calcifications directly behind the LEFT nipple. The calcifications are approximately 3 cm deep to the skin entry site. 3. No apparent complications. Electronically Signed   By: Evangeline Dakin M.D.   On: 11/08/2017 09:09       IMPRESSION/PLAN: 1. High grade ER/PR negative DCIS of the left breast. Dr. Lisbeth Renshaw discusses the final pathology findings and reviews the nature of left non invasive breast disease.  Given the negative estrogen and progesterone pathways of her disease, her good performance status, and is the concerns for close margins, she would be an excellent candidate for adjuvant therapy.  Dr. Lisbeth Renshaw discussed the risks, benefits, short, and long term effects of radiotherapy, and the patient is interested in proceeding. Dr. Lisbeth Renshaw discusses the delivery and logistics of radiotherapy and recommends a course of 4 weeks of radiotherapy with deep inspiration breath hold technique. Written consent is obtained and placed in the chart, a copy was provided to the patient.  She will proceed with simulation following this morning's appointment.  She is also traveling in the next  couple of weeks and would like to begin therapy the week of October 22.  This is still within the 6 weeks from her recent surgery.  Dr. Lisbeth Renshaw is in agreement with this.  In a visit lasting 25 minutes, greater than 50% of the time was spent face to face discussing her case, and coordinating the patient's care.   The above documentation reflects my direct findings during this shared patient visit. Please see the separate note by Dr. Lisbeth Renshaw on this date for the remainder of the patient's plan of care.    Carola Rhine, PAC

## 2017-11-27 ENCOUNTER — Encounter: Payer: Self-pay | Admitting: Radiation Oncology

## 2017-11-27 NOTE — Addendum Note (Signed)
Encounter addended by: Malena Edman, RN on: 11/27/2017 9:25 AM  Actions taken: Order Reconciliation Section accessed, Medication List reviewed, Problem List reviewed, Allergies reviewed

## 2017-12-10 ENCOUNTER — Ambulatory Visit
Admission: RE | Admit: 2017-12-10 | Discharge: 2017-12-10 | Disposition: A | Discharge status: Home / Self Care | Payer: Medicare Other | Source: Ambulatory Visit | Attending: Radiation Oncology | Admitting: Radiation Oncology

## 2017-12-10 ENCOUNTER — Ambulatory Visit: Admission: RE | Admit: 2017-12-10 | Payer: Medicare Other | Source: Ambulatory Visit | Admitting: Radiation Oncology

## 2017-12-10 DIAGNOSIS — Z171 Estrogen receptor negative status [ER-]: Secondary | ICD-10-CM | POA: Diagnosis not present

## 2017-12-10 DIAGNOSIS — D0512 Intraductal carcinoma in situ of left breast: Secondary | ICD-10-CM | POA: Diagnosis present

## 2017-12-10 DIAGNOSIS — Z510 Encounter for antineoplastic radiation therapy: Secondary | ICD-10-CM | POA: Diagnosis present

## 2017-12-16 DIAGNOSIS — Z510 Encounter for antineoplastic radiation therapy: Secondary | ICD-10-CM | POA: Diagnosis not present

## 2017-12-17 ENCOUNTER — Ambulatory Visit
Admission: RE | Admit: 2017-12-17 | Discharge: 2017-12-17 | Disposition: A | Discharge status: Home / Self Care | Payer: Medicare Other | Source: Ambulatory Visit | Attending: Radiation Oncology | Admitting: Radiation Oncology

## 2017-12-17 VITALS — BP 152/80 | HR 65 | Temp 98.5°F | Resp 18

## 2017-12-17 DIAGNOSIS — Z510 Encounter for antineoplastic radiation therapy: Secondary | ICD-10-CM | POA: Diagnosis not present

## 2017-12-17 DIAGNOSIS — D0512 Intraductal carcinoma in situ of left breast: Secondary | ICD-10-CM

## 2017-12-17 MED ORDER — RADIAPLEXRX EX GEL
Freq: Once | CUTANEOUS | Status: AC
  Administered 2017-12-17: 15:00:00 via TOPICAL

## 2017-12-18 ENCOUNTER — Ambulatory Visit
Admission: RE | Admit: 2017-12-18 | Discharge: 2017-12-18 | Disposition: A | Discharge status: Home / Self Care | Payer: Medicare Other | Source: Ambulatory Visit | Attending: Radiation Oncology | Admitting: Radiation Oncology

## 2017-12-18 DIAGNOSIS — Z510 Encounter for antineoplastic radiation therapy: Secondary | ICD-10-CM | POA: Diagnosis not present

## 2017-12-19 ENCOUNTER — Ambulatory Visit
Admission: RE | Admit: 2017-12-19 | Discharge: 2017-12-19 | Disposition: A | Discharge status: Home / Self Care | Payer: Medicare Other | Source: Ambulatory Visit | Attending: Radiation Oncology | Admitting: Radiation Oncology

## 2017-12-19 DIAGNOSIS — Z510 Encounter for antineoplastic radiation therapy: Secondary | ICD-10-CM | POA: Diagnosis not present

## 2017-12-20 ENCOUNTER — Ambulatory Visit
Admission: RE | Admit: 2017-12-20 | Discharge: 2017-12-20 | Disposition: A | Discharge status: Home / Self Care | Payer: Medicare Other | Source: Ambulatory Visit | Attending: Radiation Oncology | Admitting: Radiation Oncology

## 2017-12-20 DIAGNOSIS — Z510 Encounter for antineoplastic radiation therapy: Secondary | ICD-10-CM | POA: Diagnosis not present

## 2017-12-23 ENCOUNTER — Ambulatory Visit
Admission: RE | Admit: 2017-12-23 | Discharge: 2017-12-23 | Disposition: A | Discharge status: Home / Self Care | Payer: Medicare Other | Source: Ambulatory Visit | Attending: Radiation Oncology | Admitting: Radiation Oncology

## 2017-12-23 DIAGNOSIS — Z510 Encounter for antineoplastic radiation therapy: Secondary | ICD-10-CM | POA: Diagnosis not present

## 2017-12-24 ENCOUNTER — Ambulatory Visit
Admission: RE | Admit: 2017-12-24 | Discharge: 2017-12-24 | Disposition: A | Discharge status: Home / Self Care | Payer: Medicare Other | Source: Ambulatory Visit | Attending: Radiation Oncology | Admitting: Radiation Oncology

## 2017-12-24 DIAGNOSIS — Z510 Encounter for antineoplastic radiation therapy: Secondary | ICD-10-CM | POA: Diagnosis not present

## 2017-12-25 ENCOUNTER — Ambulatory Visit
Admission: RE | Admit: 2017-12-25 | Discharge: 2017-12-25 | Disposition: A | Discharge status: Home / Self Care | Payer: Medicare Other | Source: Ambulatory Visit | Attending: Radiation Oncology | Admitting: Radiation Oncology

## 2017-12-25 DIAGNOSIS — Z510 Encounter for antineoplastic radiation therapy: Secondary | ICD-10-CM | POA: Diagnosis not present

## 2017-12-26 ENCOUNTER — Ambulatory Visit
Admission: RE | Admit: 2017-12-26 | Discharge: 2017-12-26 | Disposition: A | Discharge status: Home / Self Care | Payer: Medicare Other | Source: Ambulatory Visit | Attending: Radiation Oncology | Admitting: Radiation Oncology

## 2017-12-26 DIAGNOSIS — Z510 Encounter for antineoplastic radiation therapy: Secondary | ICD-10-CM | POA: Diagnosis not present

## 2017-12-27 ENCOUNTER — Ambulatory Visit
Admission: RE | Admit: 2017-12-27 | Discharge: 2017-12-27 | Disposition: A | Discharge status: Home / Self Care | Payer: Medicare Other | Source: Ambulatory Visit | Attending: Radiation Oncology | Admitting: Radiation Oncology

## 2017-12-27 DIAGNOSIS — D0512 Intraductal carcinoma in situ of left breast: Secondary | ICD-10-CM | POA: Insufficient documentation

## 2017-12-30 ENCOUNTER — Ambulatory Visit
Admission: RE | Admit: 2017-12-30 | Discharge: 2017-12-30 | Disposition: A | Discharge status: Home / Self Care | Payer: Medicare Other | Source: Ambulatory Visit | Attending: Radiation Oncology | Admitting: Radiation Oncology

## 2017-12-30 DIAGNOSIS — D0512 Intraductal carcinoma in situ of left breast: Secondary | ICD-10-CM | POA: Diagnosis not present

## 2017-12-31 ENCOUNTER — Ambulatory Visit
Admission: RE | Admit: 2017-12-31 | Discharge: 2017-12-31 | Disposition: A | Discharge status: Home / Self Care | Payer: Medicare Other | Source: Ambulatory Visit | Attending: Radiation Oncology | Admitting: Radiation Oncology

## 2017-12-31 DIAGNOSIS — D0512 Intraductal carcinoma in situ of left breast: Secondary | ICD-10-CM | POA: Diagnosis not present

## 2018-01-01 ENCOUNTER — Ambulatory Visit
Admission: RE | Admit: 2018-01-01 | Discharge: 2018-01-01 | Disposition: A | Discharge status: Home / Self Care | Payer: Medicare Other | Source: Ambulatory Visit | Attending: Radiation Oncology | Admitting: Radiation Oncology

## 2018-01-01 DIAGNOSIS — D0512 Intraductal carcinoma in situ of left breast: Secondary | ICD-10-CM | POA: Diagnosis not present

## 2018-01-02 ENCOUNTER — Ambulatory Visit
Admission: RE | Admit: 2018-01-02 | Discharge: 2018-01-02 | Disposition: A | Discharge status: Home / Self Care | Payer: Medicare Other | Source: Ambulatory Visit | Attending: Radiation Oncology | Admitting: Radiation Oncology

## 2018-01-02 DIAGNOSIS — D0512 Intraductal carcinoma in situ of left breast: Secondary | ICD-10-CM | POA: Diagnosis not present

## 2018-01-03 ENCOUNTER — Ambulatory Visit
Admission: RE | Admit: 2018-01-03 | Discharge: 2018-01-03 | Disposition: A | Discharge status: Home / Self Care | Payer: Medicare Other | Source: Ambulatory Visit | Attending: Radiation Oncology | Admitting: Radiation Oncology

## 2018-01-03 ENCOUNTER — Ambulatory Visit: Payer: Medicare Other | Admitting: Radiation Oncology

## 2018-01-03 DIAGNOSIS — D0512 Intraductal carcinoma in situ of left breast: Secondary | ICD-10-CM | POA: Diagnosis not present

## 2018-01-06 ENCOUNTER — Ambulatory Visit
Admission: RE | Admit: 2018-01-06 | Discharge: 2018-01-06 | Disposition: A | Discharge status: Home / Self Care | Payer: Medicare Other | Source: Ambulatory Visit | Attending: Radiation Oncology | Admitting: Radiation Oncology

## 2018-01-06 DIAGNOSIS — D0512 Intraductal carcinoma in situ of left breast: Secondary | ICD-10-CM

## 2018-01-06 MED ORDER — RADIAPLEXRX EX GEL
Freq: Once | CUTANEOUS | Status: AC
  Administered 2018-01-06: 16:00:00 via TOPICAL

## 2018-01-07 ENCOUNTER — Ambulatory Visit: Payer: Medicare Other | Admitting: Radiation Oncology

## 2018-01-07 ENCOUNTER — Ambulatory Visit
Admission: RE | Admit: 2018-01-07 | Discharge: 2018-01-07 | Disposition: A | Discharge status: Home / Self Care | Payer: Medicare Other | Source: Ambulatory Visit | Attending: Radiation Oncology | Admitting: Radiation Oncology

## 2018-01-07 DIAGNOSIS — D0512 Intraductal carcinoma in situ of left breast: Secondary | ICD-10-CM | POA: Diagnosis not present

## 2018-01-08 ENCOUNTER — Ambulatory Visit
Admission: RE | Admit: 2018-01-08 | Discharge: 2018-01-08 | Disposition: A | Discharge status: Home / Self Care | Payer: Medicare Other | Source: Ambulatory Visit | Attending: Radiation Oncology | Admitting: Radiation Oncology

## 2018-01-08 DIAGNOSIS — D0512 Intraductal carcinoma in situ of left breast: Secondary | ICD-10-CM | POA: Diagnosis not present

## 2018-01-09 ENCOUNTER — Ambulatory Visit
Admission: RE | Admit: 2018-01-09 | Discharge: 2018-01-09 | Disposition: A | Discharge status: Home / Self Care | Payer: Medicare Other | Source: Ambulatory Visit | Attending: Radiation Oncology | Admitting: Radiation Oncology

## 2018-01-09 DIAGNOSIS — D0512 Intraductal carcinoma in situ of left breast: Secondary | ICD-10-CM | POA: Diagnosis not present

## 2018-01-10 ENCOUNTER — Telehealth: Payer: Self-pay | Admitting: *Deleted

## 2018-01-10 ENCOUNTER — Ambulatory Visit
Admission: RE | Admit: 2018-01-10 | Discharge: 2018-01-10 | Disposition: A | Discharge status: Home / Self Care | Payer: Medicare Other | Source: Ambulatory Visit | Attending: Radiation Oncology | Admitting: Radiation Oncology

## 2018-01-10 DIAGNOSIS — D0512 Intraductal carcinoma in situ of left breast: Secondary | ICD-10-CM | POA: Diagnosis not present

## 2018-01-10 NOTE — Telephone Encounter (Signed)
CALLED PATIENT TO ASK ABOUT RESCHEDULING FU FOR 02-17-18, LVM FOR A RETURN CALL

## 2018-01-13 ENCOUNTER — Ambulatory Visit
Admission: RE | Admit: 2018-01-13 | Discharge: 2018-01-13 | Disposition: A | Discharge status: Home / Self Care | Payer: Medicare Other | Source: Ambulatory Visit | Attending: Radiation Oncology | Admitting: Radiation Oncology

## 2018-01-13 DIAGNOSIS — D0512 Intraductal carcinoma in situ of left breast: Secondary | ICD-10-CM | POA: Diagnosis not present

## 2018-01-14 ENCOUNTER — Telehealth: Payer: Self-pay | Admitting: Adult Health

## 2018-01-14 NOTE — Telephone Encounter (Signed)
Scheduled appt per 11/18 sch message - sent reminder letter in the mail with appt date and time  

## 2018-01-31 ENCOUNTER — Encounter: Payer: Self-pay | Admitting: Radiation Oncology

## 2018-01-31 NOTE — Progress Notes (Signed)
  Radiation Oncology         [phone] ________________________________  Name: KRYSTIANA FORNES MRN: [id number]  Date: 01/31/2018  DOB: September 10, 1940  End of Treatment Note  Diagnosis:   Left-sided breast cancer     Indication for treatment:  Curative       Radiation treatment dates:   12/17/17 - 01/13/18  Site/dose:   The patient initially received a dose of 42.56 Gy in 16 fractions to the breast using whole-breast tangent fields. This was delivered using a 3-D conformal technique. The patient then received a boost to the seroma. This delivered an additional 8 Gy in 4 fractions using a 3 field photon technique due to the depth of the seroma. The total dose was 50.56 Gy.  Narrative: The patient tolerated radiation treatment relatively well. The patient had some expected skin irritation as she progressed during treatment. Moist desquamation was not present at the end of treatment. She denied pain and fatigue throughout treatment. She experienced erythema but no desquamation.  Plan: The patient has completed radiation treatment. The patient will return to radiation oncology clinic for routine followup in one month. I advised the patient to call or return sooner if they have any questions or concerns related to their recovery or treatment. ________________________________  Jodelle Gross, M.D., Ph.D.

## 2018-02-17 ENCOUNTER — Ambulatory Visit: Payer: Self-pay | Admitting: Radiation Oncology

## 2018-03-06 ENCOUNTER — Telehealth: Payer: Self-pay | Admitting: Radiation Oncology

## 2018-03-06 NOTE — Telephone Encounter (Signed)
I spoke with the patient and she reports she's doing very well since completing radiation. She denies any skin concerns at this time. We discussed skin care and measures to avoid sun exposure to this area. She would like to cancel her 1/13 appt with me and I will cancel this. She does need to see Dr. Lindi Adie and Rene Paci send a message to his nurse to coordinate.

## 2018-03-10 ENCOUNTER — Ambulatory Visit: Payer: Medicare Other | Admitting: Radiation Oncology

## 2018-03-17 ENCOUNTER — Telehealth: Payer: Self-pay | Admitting: Hematology and Oncology

## 2018-03-17 NOTE — Telephone Encounter (Signed)
Scheduled appt per 1/9 sch message - pt is aware of appt date and time   

## 2018-03-19 NOTE — Progress Notes (Signed)
Patient Care Team: Deland Pretty, MD as PCP - General (Internal Medicine) [name], MD as Consulting Physician (General Surgery) Nicholas Lose, MD as Consulting Physician (Hematology and Oncology) Kyung Rudd, MD as Consulting Physician (Radiation Oncology)  DIAGNOSIS:    ICD-10-CM   1. Ductal carcinoma in situ (DCIS) of left breast D05.12 MM DIAG BREAST TOMO BILATERAL    SUMMARY OF ONCOLOGIC HISTORY:   Ductal carcinoma in situ (DCIS) of left breast   09/30/2017 Initial Diagnosis    Screening detected left breast calcifications 2 groups upper outer quadrant 1.5 cm and 1.2 cm both of which are biopsy-proven high-grade DCIS with calcifications ER PR negative.  Underneath the nipple 2 groups of calcifications.  Posterior group was biopsy-proven fibrocystic change anterior group could not be biopsied.  Tis NX stage 0    11/08/2017 Surgery    2 left lumpectomies: DCIS high-grade 0.9 cm focally less than 0.1 cm to medial margin; second lumpectomy left subareolar: DCIS with calcifications high-grade 1.2 cm focally less than 0.1 cm superior margin and focally,  0.1 cm to anterior margin ER 0%, PR 0% for both Tis NX stage 0    12/18/2017 - 01/13/2018 Radiation Therapy    Adjuvant radiation therapy     CHIEF COMPLIANT: Follow-up after radiation  INTERVAL HISTORY: Michele [name] is a 78 y.o. with above-mentioned history of DCIS of the left breast treated with two lumpectomies and radiation treatment that ended on 01/13/18. She is currently on surveillance. She presents to the clinic alone today and reports radiation went well. She denies any discomfort in her breasts but reports an occasional stabbing pain.   REVIEW OF SYSTEMS:   Constitutional: Denies fevers, chills or abnormal weight loss Eyes: Denies blurriness of vision Ears, nose, mouth, throat, and face: Denies mucositis or sore throat Respiratory: Denies cough, dyspnea or wheezes Cardiovascular: Denies palpitation, chest  discomfort Gastrointestinal:  Denies nausea, heartburn or change in bowel habits Skin: Denies abnormal skin rashes Lymphatics: Denies new lymphadenopathy or easy bruising Neurological: Denies numbness, tingling or new weaknesses Behavioral/Psych: Mood is stable, no new changes  Extremities: No lower extremity edema Breast: denies lumps or nodules in either breasts (+) occasional stabbing pain All other systems were reviewed with the patient and are negative.  I have reviewed the past medical history, past surgical history, social history and family history with the patient and they are unchanged from previous note.  ALLERGIES:  is allergic to azithromycin; influenza vaccines; penicillins; and reglan [metoclopramide].  MEDICATIONS:  Current Outpatient Medications  Medication Sig Dispense Refill  . Cholecalciferol (VITAMIN D3) 1000 units CAPS Take by mouth daily.    . Citalopram Hydrobromide (CELEXA PO) Take 10 mg by mouth as needed.    . Coenzyme Q10 (COQ10 PO) Take by mouth daily.    . fluticasone (VERAMYST) 27.5 MCG/SPRAY nasal spray Place 2 sprays into the nose daily.    Marland Kitchen levocetirizine (XYZAL) 5 MG tablet Take 5 mg by mouth every evening.    . TURMERIC PO Take by mouth daily.     No current facility-administered medications for this visit.     PHYSICAL EXAMINATION: ECOG PERFORMANCE STATUS: 0 - Asymptomatic  Vitals:   03/20/18 1024  BP: 131/72  Pulse: 70  Resp: 17  Temp: 98.3 F (36.8 C)  SpO2: 98%   Filed Weights   03/20/18 1024  Weight: 163 lb 14.4 oz (74.3 kg)    GENERAL: alert, no distress and comfortable SKIN: skin color, texture, turgor are normal, no  rashes or significant lesions EYES: normal, Conjunctiva are pink and non-injected, sclera clear OROPHARYNX: no exudate, no erythema and lips, buccal mucosa, and tongue normal  NECK: supple, thyroid normal size, non-tender, without nodularity LYMPH: no palpable lymphadenopathy in the cervical, axillary or  inguinal LUNGS: clear to auscultation and percussion with normal breathing effort HEART: regular rate & rhythm and no murmurs and no lower extremity edema ABDOMEN: abdomen soft, non-tender and normal bowel sounds MUSCULOSKELETAL: no cyanosis of digits and no clubbing  NEURO: alert & oriented x 3 with fluent speech, no focal motor/sensory deficits EXTREMITIES: No lower extremity edema BREAST: No palpable masses or nodules in either right or left breasts.  Scar tissue in the left breast related to surgery and radiation.  No palpable axillary supraclavicular or infraclavicular adenopathy no breast tenderness or nipple discharge. (exam performed in the presence of a chaperone)  LABORATORY DATA:  I have reviewed the data as listed CMP Latest Ref Rng & Units 10/09/2017 11/17/2014  Glucose 70 - 99 mg/dL 113(H) 94  BUN 8 - 23 mg/dL 15 13  Creatinine 0.44 - 1.00 mg/dL 0.88 1.00  Sodium 135 - 145 mmol/L 141 139  Potassium 3.5 - 5.1 mmol/L 4.7 4.9  Chloride 98 - 111 mmol/L 102 97  CO2 22 - 32 mmol/L 28 -  Calcium 8.9 - 10.3 mg/dL 9.2 9.6  Total Protein 6.5 - 8.1 g/dL 7.4 6.9  Total Bilirubin 0.3 - 1.2 mg/dL 0.6 0.5  Alkaline Phos 38 - 126 U/L 55 51  AST 15 - 41 U/L 16 14  ALT 0 - 44 U/L 10 -    Lab Results  Component Value Date   WBC 5.9 10/09/2017   HGB 14.3 10/09/2017   HCT 43.4 10/09/2017   MCV 94.9 10/09/2017   PLT 259 10/09/2017   NEUTROABS 3.7 10/09/2017    ASSESSMENT & PLAN:  Ductal carcinoma in situ (DCIS) of left breast 11/08/2017:Two left lumpectomies: DCIS high-grade 0.9 cm focally less than 0.1 cm to medial margin; second lumpectomy left subareolar: DCIS with calcifications high-grade 1.2 cm focally less than 0.1 cm superior margin and focally,  0.1 cm to anterior margin ER 0%, PR 0% for both Tis NX stage 0 Adjuvant radiation therapy completed 01/13/2018 No role of antiestrogen therapy because she is ER PR negative  Breast cancer surveillance: 1.  Mammograms annually: Will  be scheduled for July 2020 2.  Breast exam 03/20/2018: Benign  Return to clinic in 1 year for follow-up and surveillance     Orders Placed This Encounter  Procedures  . MM DIAG BREAST TOMO BILATERAL    Standing Status:   Future    Standing Expiration Date:   03/21/2019    Order Specific Question:   Reason for Exam (SYMPTOM  OR DIAGNOSIS REQUIRED)    Answer:   annual mammograms with DCSI history    Order Specific Question:   Preferred imaging location?    Answer:   [facility]   The patient has a good understanding of the overall plan. she agrees with it. she will call with any problems that may develop before the next visit here.  Nicholas Lose, MD 03/20/2018  Julious Oka Dorshimer am acting as scribe for Dr. Nicholas Lose.  I have reviewed the above documentation for accuracy and completeness, and I agree with the above.

## 2018-03-20 ENCOUNTER — Telehealth: Payer: Self-pay | Admitting: Hematology and Oncology

## 2018-03-20 ENCOUNTER — Inpatient Hospital Stay: Payer: Medicare Other | Attending: Hematology and Oncology | Admitting: Hematology and Oncology

## 2018-03-20 DIAGNOSIS — Z171 Estrogen receptor negative status [ER-]: Secondary | ICD-10-CM | POA: Diagnosis not present

## 2018-03-20 DIAGNOSIS — Z923 Personal history of irradiation: Secondary | ICD-10-CM | POA: Diagnosis not present

## 2018-03-20 DIAGNOSIS — D0512 Intraductal carcinoma in situ of left breast: Secondary | ICD-10-CM | POA: Diagnosis not present

## 2018-03-20 DIAGNOSIS — Z79899 Other long term (current) drug therapy: Secondary | ICD-10-CM | POA: Insufficient documentation

## 2018-03-20 NOTE — Telephone Encounter (Signed)
Gave AVS and calendar °

## 2018-03-20 NOTE — Assessment & Plan Note (Signed)
11/08/2017:Two left lumpectomies: DCIS high-grade 0.9 cm focally less than 0.1 cm to medial margin; second lumpectomy left subareolar: DCIS with calcifications high-grade 1.2 cm focally less than 0.1 cm superior margin and focally,  0.1 cm to anterior margin ER 0%, PR 0% for both Tis NX stage 0 Adjuvant radiation therapy completed 01/13/2018 No role of antiestrogen therapy because she is ER PR negative  Breast cancer surveillance: 1.  Mammograms annually 2.  Breast exam 03/20/2018: Benign  Return to clinic in 1 year for follow-up and surveillance with long-term survivorship

## 2018-03-27 ENCOUNTER — Inpatient Hospital Stay: Payer: Medicare Other | Admitting: Adult Health

## 2018-09-25 ENCOUNTER — Ambulatory Visit
Admission: RE | Admit: 2018-09-25 | Discharge: 2018-09-25 | Disposition: A | Discharge status: Home / Self Care | Payer: Medicare Other | Source: Ambulatory Visit | Attending: Hematology and Oncology | Admitting: Hematology and Oncology

## 2018-09-25 ENCOUNTER — Other Ambulatory Visit: Payer: Self-pay

## 2018-09-25 DIAGNOSIS — D0512 Intraductal carcinoma in situ of left breast: Secondary | ICD-10-CM

## 2019-03-22 NOTE — Progress Notes (Signed)
Patient Care Team: Deland Pretty, MD as PCP - General (Internal Medicine) [name], MD as Consulting Physician (General Surgery) Nicholas Lose, MD as Consulting Physician (Hematology and Oncology) Kyung Rudd, MD as Consulting Physician (Radiation Oncology) Delice Bison Charlestine Massed, NP as Nurse Practitioner (Hematology and Oncology)  DIAGNOSIS:    ICD-10-CM   1. Ductal carcinoma in situ (DCIS) of left breast  D05.12     SUMMARY OF ONCOLOGIC HISTORY: Oncology History  Ductal carcinoma in situ (DCIS) of left breast  09/30/2017 Initial Diagnosis   Screening detected left breast calcifications 2 groups upper outer quadrant 1.5 cm and 1.2 cm both of which are biopsy-proven high-grade DCIS with calcifications ER PR negative.  Underneath the nipple 2 groups of calcifications.  Posterior group was biopsy-proven fibrocystic change anterior group could not be biopsied.  Tis NX stage 0   11/08/2017 Surgery   2 left lumpectomies: DCIS high-grade 0.9 cm focally less than 0.1 cm to medial margin; second lumpectomy left subareolar: DCIS with calcifications high-grade 1.2 cm focally less than 0.1 cm superior margin and focally,  0.1 cm to anterior margin ER 0%, PR 0% for both Tis NX stage 0   12/18/2017 - 01/13/2018 Radiation Therapy   Adjuvant radiation therapy     CHIEF COMPLIANT: Surveillance of left breast DCIS  INTERVAL HISTORY: Michele [name] is a 79 y.o. with above-mentioned history of left breast DCIS treated with two lumpectomies and radiation. She is currently on surveillance. Mammogram on 09/25/18 showed no evidence of malignancy bilaterally. She presents to the clinic today for follow-up.    ALLERGIES:  is allergic to azithromycin; influenza vaccines; penicillins; and reglan [metoclopramide].  MEDICATIONS:  Current Outpatient Medications  Medication Sig Dispense Refill  . Cholecalciferol (VITAMIN D3) 1000 units CAPS Take by mouth daily.    . Citalopram Hydrobromide (CELEXA PO)  Take 10 mg by mouth as needed.    . Coenzyme Q10 (COQ10 PO) Take by mouth daily.    . fluticasone (VERAMYST) 27.5 MCG/SPRAY nasal spray Place 2 sprays into the nose daily.    Marland Kitchen levocetirizine (XYZAL) 5 MG tablet Take 5 mg by mouth every evening.    . TURMERIC PO Take by mouth daily.     No current facility-administered medications for this visit.    PHYSICAL EXAMINATION: ECOG PERFORMANCE STATUS: 1 - Symptomatic but completely ambulatory  There were no vitals filed for this visit. There were no vitals filed for this visit.  BREAST: No palpable masses or nodules in either right or left breasts. No palpable axillary supraclavicular or infraclavicular adenopathy no breast tenderness or nipple discharge. (exam performed in the presence of a chaperone)  LABORATORY DATA:  I have reviewed the data as listed CMP Latest Ref Rng & Units 10/09/2017 11/17/2014  Glucose 70 - 99 mg/dL 113(H) 94  BUN 8 - 23 mg/dL 15 13  Creatinine 0.44 - 1.00 mg/dL 0.88 1.00  Sodium 135 - 145 mmol/L 141 139  Potassium 3.5 - 5.1 mmol/L 4.7 4.9  Chloride 98 - 111 mmol/L 102 97  CO2 22 - 32 mmol/L 28 -  Calcium 8.9 - 10.3 mg/dL 9.2 9.6  Total Protein 6.5 - 8.1 g/dL 7.4 6.9  Total Bilirubin 0.3 - 1.2 mg/dL 0.6 0.5  Alkaline Phos 38 - 126 U/L 55 51  AST 15 - 41 U/L 16 14  ALT 0 - 44 U/L 10 -    Lab Results  Component Value Date   WBC 5.9 10/09/2017   HGB 14.3 10/09/2017  HCT 43.4 10/09/2017   MCV 94.9 10/09/2017   PLT 259 10/09/2017   NEUTROABS 3.7 10/09/2017    ASSESSMENT & PLAN:  Ductal carcinoma in situ (DCIS) of left breast 11/08/2017:Twoleft lumpectomies: DCIS high-grade 0.9 cm focally less than 0.1 cm to medial margin; second lumpectomy left subareolar: DCIS with calcifications high-grade 1.2 cm focally less than 0.1 cm superior margin and focally, 0.1 cm to anterior margin ER 0%, PR 0% for both Tis NX stage 0 Adjuvant radiation therapy completed 01/13/2018 No role of antiestrogen therapy because  she is ER PR negative  Breast cancer surveillance: 1.  Mammograms 2018-09-25: No evidence of recurrent breast cancer. Breast density category B 2.  Breast exam 2019-03-23: Benign  Return to clinic in 1 year for follow-up and surveillance     No orders of the defined types were placed in this encounter.  The patient has a good understanding of the overall plan. she agrees with it. she will call with any problems that may develop before the next visit here.  Total time spent: 15 mins including face to face time and time spent for planning, charting and coordination of care  Nicholas Lose, MD 03/23/2019  I, Cloyde Reams Dorshimer, am acting as scribe for Dr. Nicholas Lose.  I have reviewed the above documentation for accuracy and completeness, and I agree with the above.

## 2019-03-23 ENCOUNTER — Inpatient Hospital Stay: Payer: Medicare PPO | Attending: Hematology and Oncology | Admitting: Hematology and Oncology

## 2019-03-23 ENCOUNTER — Other Ambulatory Visit: Payer: Self-pay

## 2019-03-23 DIAGNOSIS — Z79899 Other long term (current) drug therapy: Secondary | ICD-10-CM | POA: Diagnosis not present

## 2019-03-23 DIAGNOSIS — Z923 Personal history of irradiation: Secondary | ICD-10-CM | POA: Diagnosis not present

## 2019-03-23 DIAGNOSIS — Z86000 Personal history of in-situ neoplasm of breast: Secondary | ICD-10-CM | POA: Insufficient documentation

## 2019-03-23 DIAGNOSIS — D0512 Intraductal carcinoma in situ of left breast: Secondary | ICD-10-CM

## 2019-03-23 NOTE — Assessment & Plan Note (Signed)
11/08/2017:Twoleft lumpectomies: DCIS high-grade 0.9 cm focally less than 0.1 cm to medial margin; second lumpectomy left subareolar: DCIS with calcifications high-grade 1.2 cm focally less than 0.1 cm superior margin and focally, 0.1 cm to anterior margin ER 0%, PR 0% for both Tis NX stage 0 Adjuvant radiation therapy completed 01/13/2018 No role of antiestrogen therapy because she is ER PR negative  Breast cancer surveillance: 1.  Mammograms 2018-09-25: No evidence of recurrent breast cancer. Breast density category B 2.  Breast exam 2019-03-23: Benign  Return to clinic in 1 year for follow-up and surveillance

## 2019-03-24 ENCOUNTER — Telehealth: Payer: Self-pay | Admitting: Hematology and Oncology

## 2019-03-24 NOTE — Telephone Encounter (Signed)
Scheduled per 1/25 los. Called and spoke with Patrick Jupiter (spouse), confirmed 1/26 appt

## 2019-05-07 DIAGNOSIS — D492 Neoplasm of unspecified behavior of bone, soft tissue, and skin: Secondary | ICD-10-CM | POA: Diagnosis not present

## 2019-05-07 DIAGNOSIS — L814 Other melanin hyperpigmentation: Secondary | ICD-10-CM | POA: Diagnosis not present

## 2019-05-07 DIAGNOSIS — C44311 Basal cell carcinoma of skin of nose: Secondary | ICD-10-CM | POA: Diagnosis not present

## 2019-05-07 DIAGNOSIS — L281 Prurigo nodularis: Secondary | ICD-10-CM | POA: Diagnosis not present

## 2019-05-07 DIAGNOSIS — Z85828 Personal history of other malignant neoplasm of skin: Secondary | ICD-10-CM | POA: Diagnosis not present

## 2019-05-07 DIAGNOSIS — L578 Other skin changes due to chronic exposure to nonionizing radiation: Secondary | ICD-10-CM | POA: Diagnosis not present

## 2019-05-07 DIAGNOSIS — D229 Melanocytic nevi, unspecified: Secondary | ICD-10-CM | POA: Diagnosis not present

## 2019-06-30 DIAGNOSIS — C44311 Basal cell carcinoma of skin of nose: Secondary | ICD-10-CM | POA: Diagnosis not present

## 2019-07-06 IMAGING — MG STEREOTACTIC CORE NEEDLE BIOPSY
8 of 15 series · 8 of 35 positions shown · non-contrast
Comparison: Previous exams.

ADDENDUM:
Pathology revealed FIBROCYSTIC CHANGES WITH CALCIFICATIONS of the
Left breast, central anterior (ribbon clip). This was found to be
concordant by Dr. Ebadat Tiger.

Please note, 2 stereotactic biopsies performed on 09/25/2017
demonstrate high-grade ductal carcinoma in situ with calcifications.
There is approximately 5 cm medial displacement of the X shaped clip
from the site of biopsy. Residual calcifications are present which
may be used for localization.
Pathology results were discussed with the patient by telephone. The
patient reported doing well after the biopsy with tenderness at the
site. Post biopsy instructions and care were reviewed and questions
were answered. The patient was encouraged to call The [REDACTED]
The patient has a recent diagnosis of left breast cancer and should
follow her outlined treatment plan. The patient was referred to [REDACTED] [REDACTED] at [REDACTED] on October 09, 2017.
Pathology results reported by Sunayi Lamba, RN on 10/01/2017.
CLINICAL DATA: 77-year-old female with known left breast DCIS
presenting for additional biopsy of anterior left breast
calcifications.
EXAM:
LEFT BREAST STEREOTACTIC CORE NEEDLE BIOPSY

[L (1 of 8)]
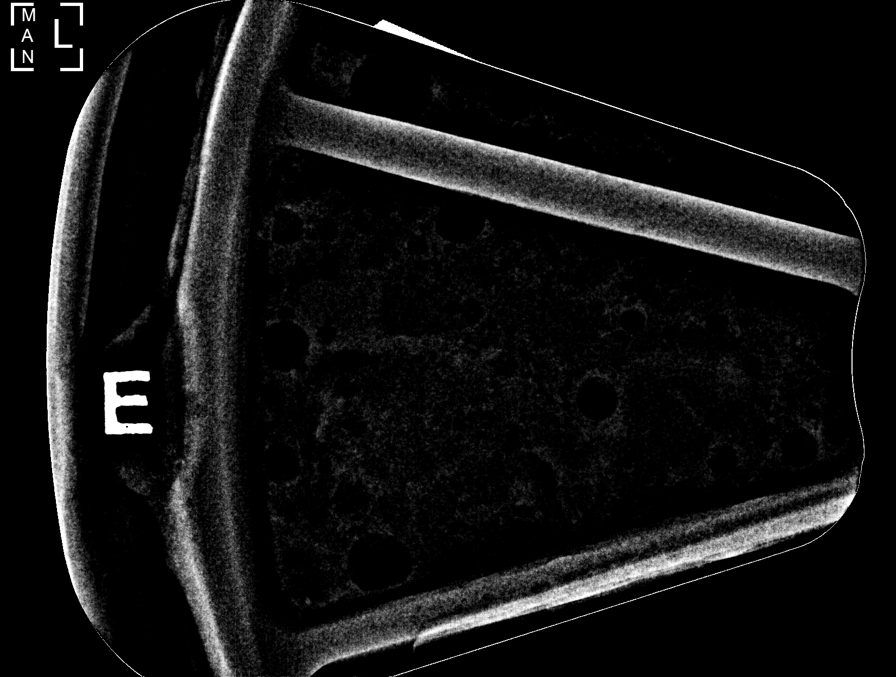

[L (2 of 8)]
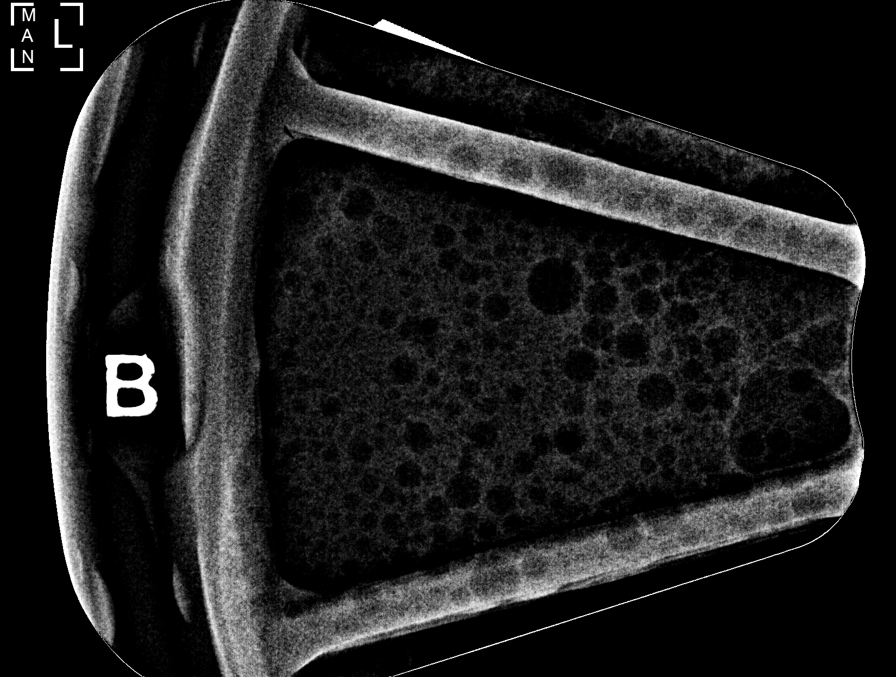

[L (3 of 8)]
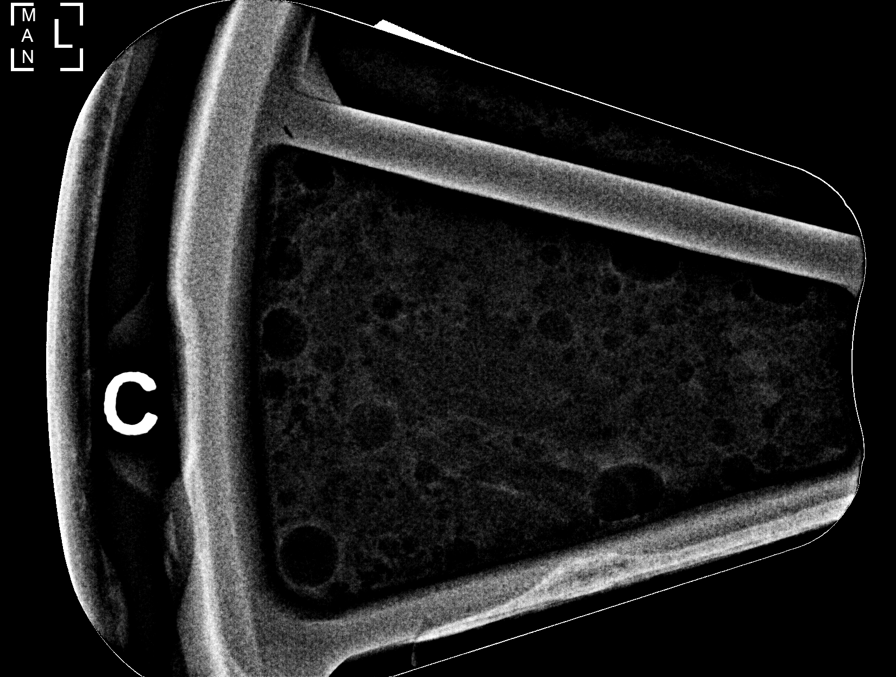

[L (4 of 8)]
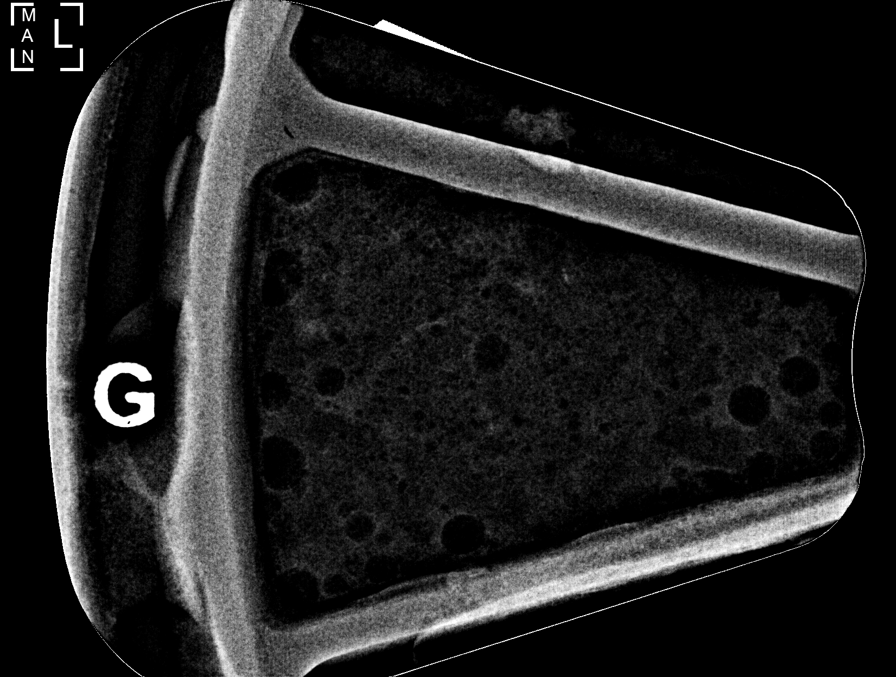

[L (5 of 8)]
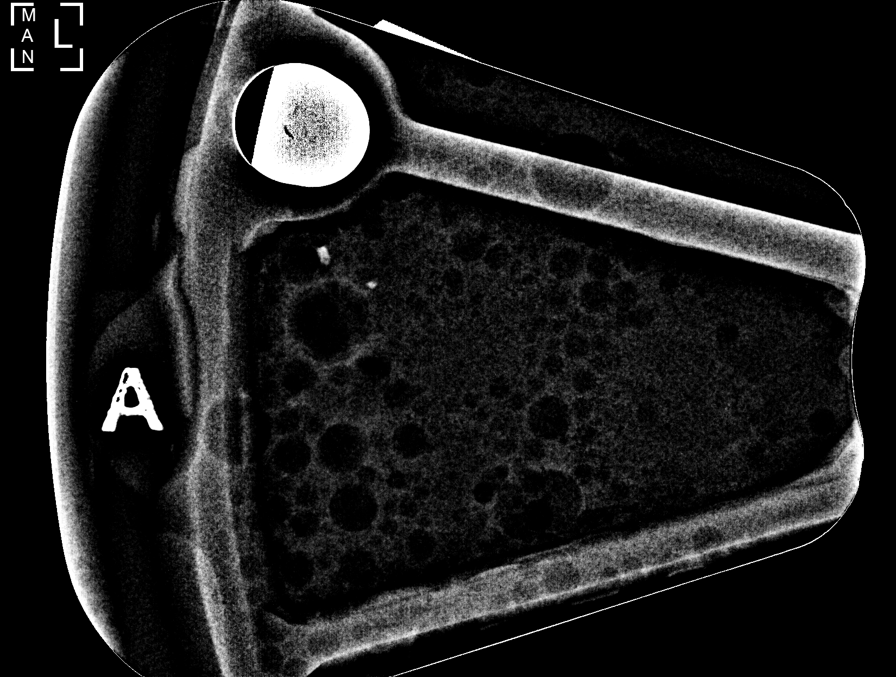

[L (6 of 8)]
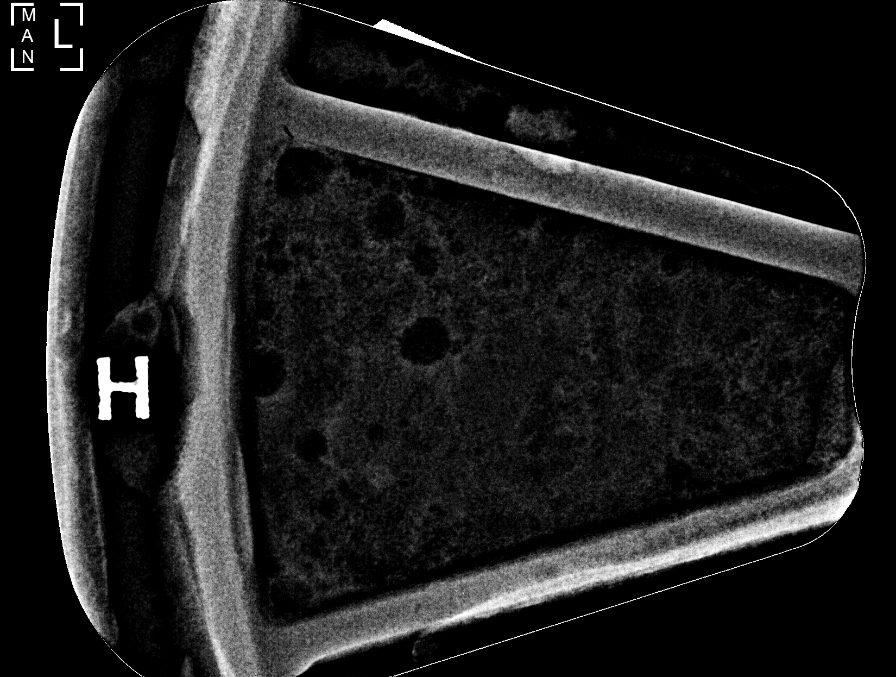

[L (7 of 8)]
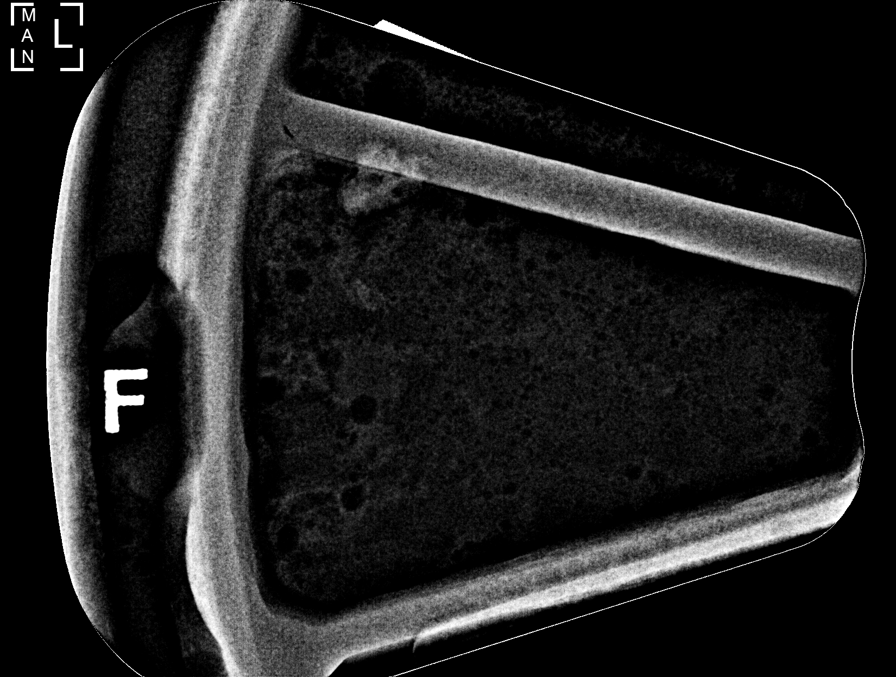

[L (8 of 8)]
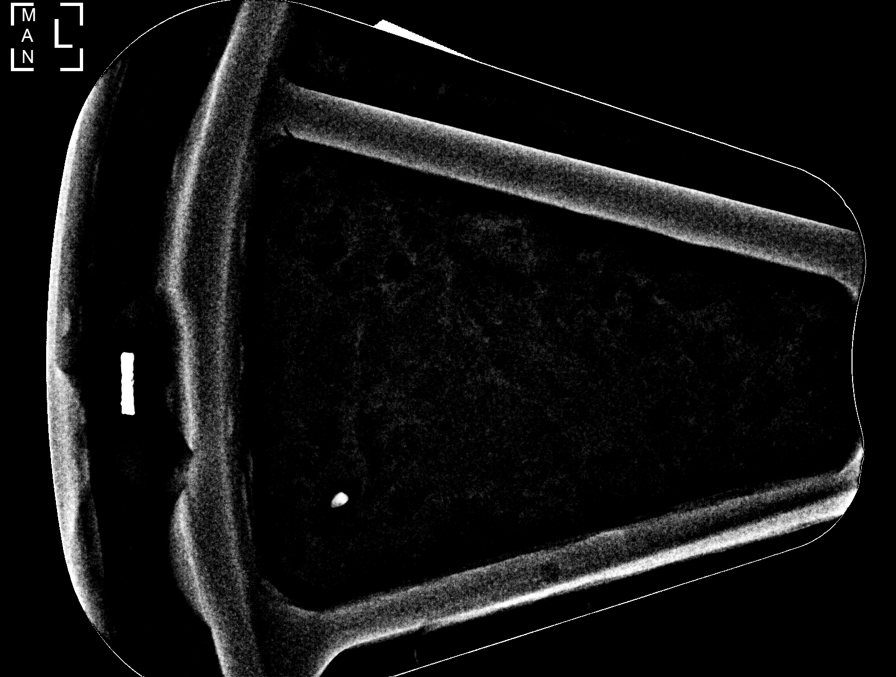

[8 of 35 positions shown; findings below may reference images not displayed]



Using sterile technique and 1% Lidocaine as local anesthetic, under
stereotactic guidance, a 9 gauge vacuum assisted device was used to
perform core needle biopsy of calcifications in the central left
breast at anterior depth using a superior approach. Specimen
radiograph was performed showing calcifications in several
specimens. Specimens with calcifications are identified for
pathology.

Lesion quadrant: Upper outer quadrant

At the conclusion of the procedure, a ribbon shaped tissue marker
clip was deployed into the biopsy cavity. Follow-up 2-view mammogram
was performed and dictated separately.
IMPRESSION: Stereotactic-guided biopsy of left breast calcifications. No
apparent complications.

## 2019-07-29 ENCOUNTER — Other Ambulatory Visit: Payer: Self-pay | Admitting: Hematology and Oncology

## 2019-07-29 DIAGNOSIS — Z853 Personal history of malignant neoplasm of breast: Secondary | ICD-10-CM

## 2019-07-30 DIAGNOSIS — R7309 Other abnormal glucose: Secondary | ICD-10-CM | POA: Diagnosis not present

## 2019-07-30 DIAGNOSIS — E785 Hyperlipidemia, unspecified: Secondary | ICD-10-CM | POA: Diagnosis not present

## 2019-07-30 DIAGNOSIS — K219 Gastro-esophageal reflux disease without esophagitis: Secondary | ICD-10-CM | POA: Diagnosis not present

## 2019-08-04 DIAGNOSIS — J309 Allergic rhinitis, unspecified: Secondary | ICD-10-CM | POA: Diagnosis not present

## 2019-08-04 DIAGNOSIS — F339 Major depressive disorder, recurrent, unspecified: Secondary | ICD-10-CM | POA: Diagnosis not present

## 2019-08-04 DIAGNOSIS — E785 Hyperlipidemia, unspecified: Secondary | ICD-10-CM | POA: Diagnosis not present

## 2019-08-04 DIAGNOSIS — Z0000 Encounter for general adult medical examination without abnormal findings: Secondary | ICD-10-CM | POA: Diagnosis not present

## 2019-08-04 DIAGNOSIS — F17211 Nicotine dependence, cigarettes, in remission: Secondary | ICD-10-CM | POA: Diagnosis not present

## 2019-08-04 DIAGNOSIS — Z85828 Personal history of other malignant neoplasm of skin: Secondary | ICD-10-CM | POA: Diagnosis not present

## 2019-08-04 DIAGNOSIS — Z923 Personal history of irradiation: Secondary | ICD-10-CM | POA: Diagnosis not present

## 2019-08-04 DIAGNOSIS — Z8709 Personal history of other diseases of the respiratory system: Secondary | ICD-10-CM | POA: Diagnosis not present

## 2019-08-04 DIAGNOSIS — K219 Gastro-esophageal reflux disease without esophagitis: Secondary | ICD-10-CM | POA: Diagnosis not present

## 2019-08-14 IMAGING — MG NEEDLE LOCALIZATION OF THE LEFT BREAST WITH MAMMO GUIDANCE
3 series · 3 of 3 positions shown · non-contrast
Comparison: Previous exam(s).

CLINICAL DATA: 77-year-old with 2 groups of calcifications in the
UPPER OUTER QUADRANT of the LEFT breast at MIDDLE depth, both
biopsy-proven high-grade DCIS. Patient also has suspicious
calcifications directly behind the LEFT nipple. Radioactive seed
localization of the 2 foci of DCIS in the UPPER OUTER QUADRANT and
needle/wire localization of the suspicious calcifications behind the
LEFT nipple is performed in anticipation of lumpectomy later today.

[L LM (1 of 2)]
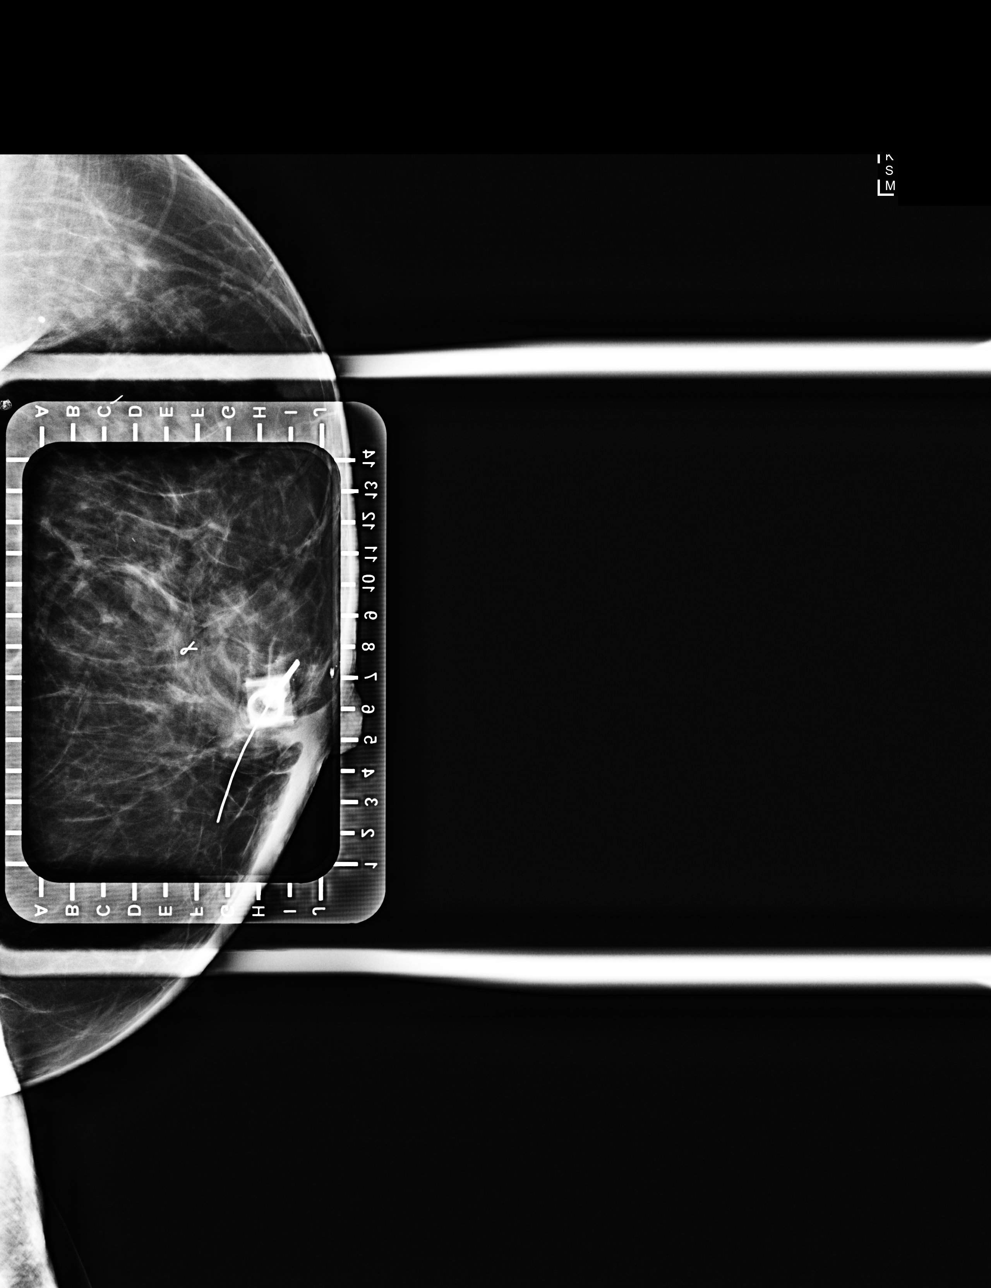

[L LM (2 of 2)]
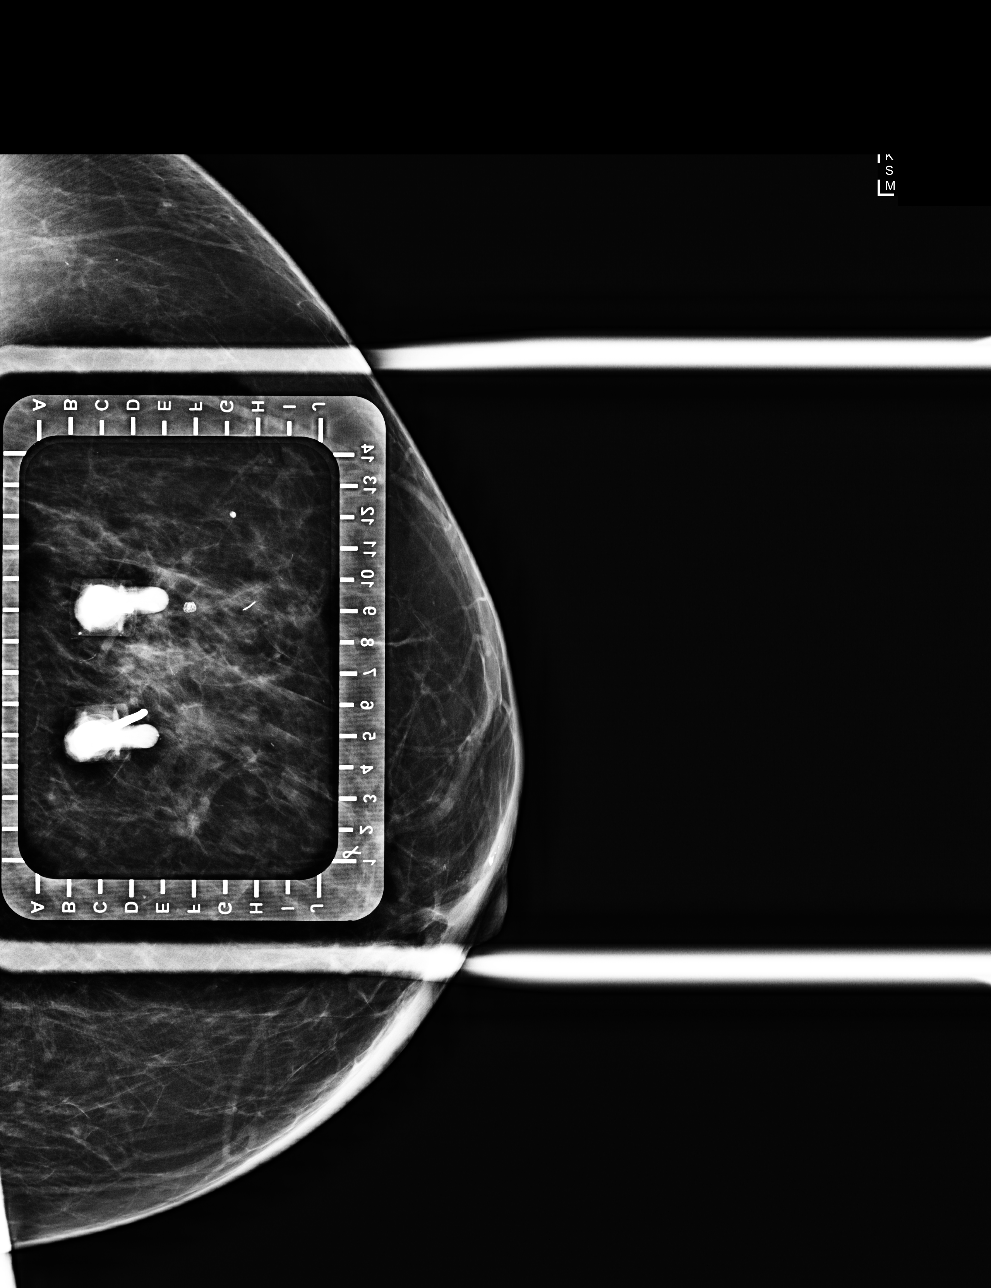

[L CC]
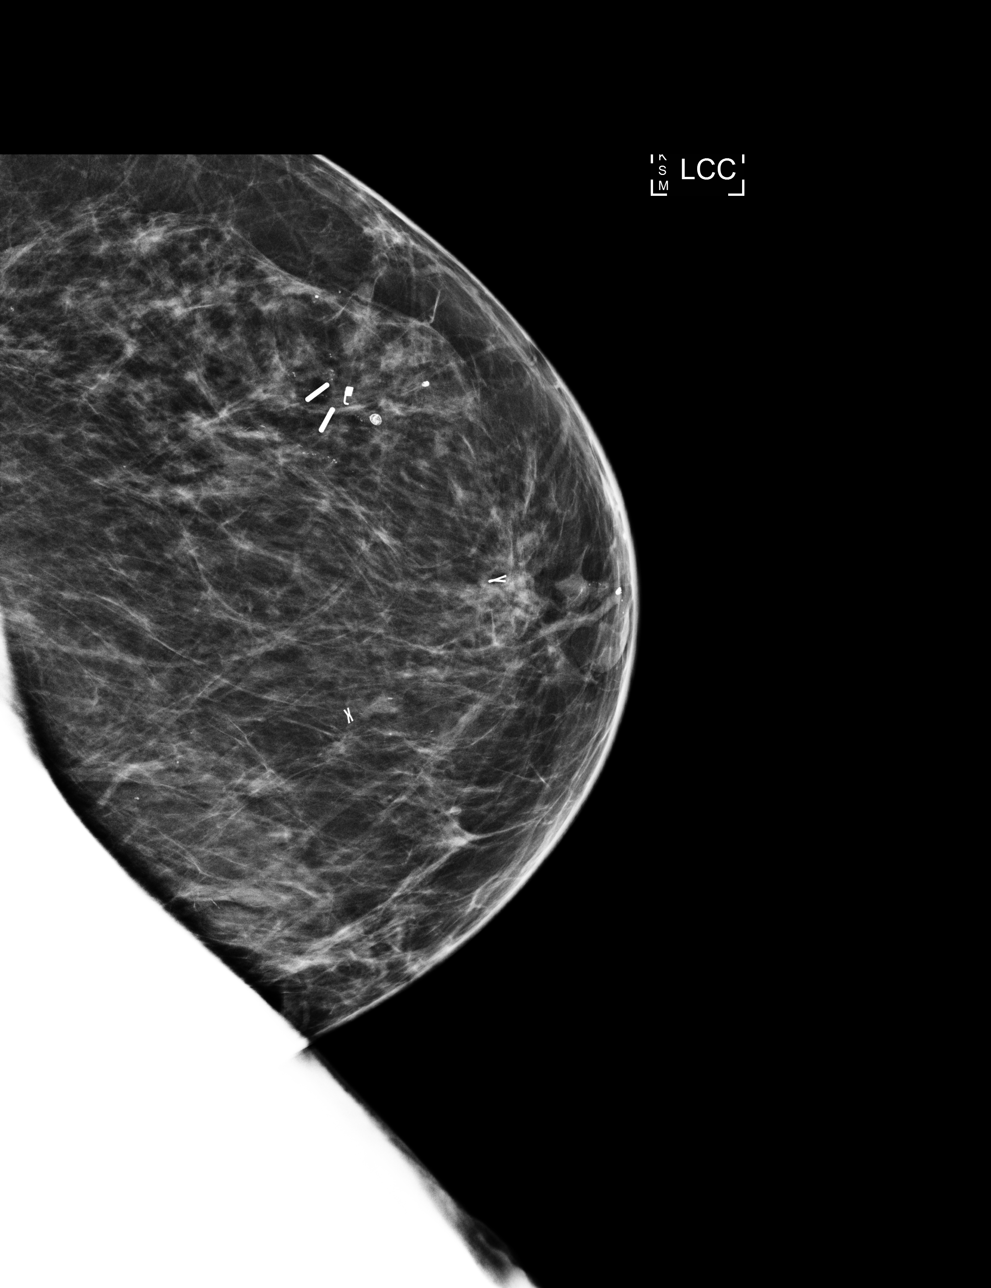

[3 of 3 positions shown; findings below may reference images not displayed]

Of note, the SUPERIOR group of calcifications in the UPPER OUTER
QUADRANT are associated with a coil shaped tissue marker clip which
is position at the site of the biopsy. The X shaped tissue marker
clip placed at the time of biopsy of the INFERIOR group of
calcifications in the UPPER OUTER QUADRANT migrated approximately 5
cm and will not be in the specimen. A ribbon shaped tissue marker
clip is present in the subareolar location at ANTERIOR depth from a
biopsy of benign calcifications.

EXAM:
MAMMOGRAPHIC GUIDED RADIOACTIVE SEED LOCALIZATION OF THE LEFT BREAST
x 2

MAMMOGRAPHIC GUIDED NEEDLE/WIRE LOCALIZATION OF THE LEFT BREAST
FINDINGS: Patient presents for radioactive seed localization prior to LEFT
breast lumpectomy. I met with the patient and we discussed the
procedure of seed localization including benefits and alternatives.
We discussed the high likelihood of a successful procedure. We
discussed the risks of the procedure including infection, bleeding,
tissue injury and further surgery. We discussed the low dose of
radioactivity involved in the procedure. Informed, written consent
was given.

The usual time-out protocol was performed immediately prior to the
procedure.

Initially, using mammographic guidance, sterile technique with
chlorhexidine as skin antisepsis, 1% lidocaine as local anesthetic,
an 6-NUM radioactive seed was used to localize the SUPERIOR group of
calcifications in the UPPER OUTER QUADRANT at MIDDLE depth using a
LATERAL approach. Subsequently, an 6-NUM radioactive seed was used
to localize the INFERIOR group of calcifications in the UPPER OUTER
QUADRANT at MIDDLE depth.

Finally, using mammographic guidance, sterile technique with
chlorhexidine as skin antisepsis, 1% lidocaine as local anesthetic,
a 5 cm modified Yiyi needle was used to localize the
calcifications directly behind the LEFT nipple using a LATERAL
approach. The wire was placed through the calcifications. The
calcifications are approximately 3 cm deep to the skin entry site.

The follow-up mammogram images confirm that both seeds and the
localization wire are in the expected location and are marked for
Dr. Gilliland. The radioactive seeds are approximately 1.7 cm apart.

Follow-up survey of the patient confirms presence of the radioactive
seeds.

Order number of 6-NUM seed (SUPERIOR group of calcifications):

Total Activity: 0.252 mCi

Reference Date: 10/23/2017

Order number of 6-NUM (INFERIOR group of calcifications): [id number]

Total Activity: 0.252 mCi

Reference Date: 10/23/2017

The patient tolerated the procedure well and was released from the
[REDACTED]. She was given instructions regarding seed removal.
The patient is scheduled for surgery at 11 o'clock a.m. today.
IMPRESSION: 1. Radioactive seed localization of 2 groups of calcifications
involving the UPPER OUTER QUADRANT of the LEFT breast. The seeds are
approximately 1.7 cm apart.
2. Wire localization of calcifications directly behind the LEFT
nipple. The calcifications are approximately 3 cm deep to the skin
entry site.
3. No apparent complications.

## 2019-08-27 DIAGNOSIS — I2510 Atherosclerotic heart disease of native coronary artery without angina pectoris: Secondary | ICD-10-CM | POA: Diagnosis not present

## 2019-09-08 DIAGNOSIS — H40013 Open angle with borderline findings, low risk, bilateral: Secondary | ICD-10-CM | POA: Diagnosis not present

## 2019-09-23 DIAGNOSIS — I2510 Atherosclerotic heart disease of native coronary artery without angina pectoris: Secondary | ICD-10-CM | POA: Diagnosis not present

## 2019-09-23 DIAGNOSIS — E785 Hyperlipidemia, unspecified: Secondary | ICD-10-CM | POA: Diagnosis not present

## 2019-09-28 ENCOUNTER — Ambulatory Visit
Admission: RE | Admit: 2019-09-28 | Discharge: 2019-09-28 | Disposition: A | Discharge status: Home / Self Care | Payer: Medicare PPO | Source: Ambulatory Visit | Attending: Hematology and Oncology | Admitting: Hematology and Oncology

## 2019-09-28 ENCOUNTER — Other Ambulatory Visit: Payer: Self-pay

## 2019-09-28 DIAGNOSIS — Z853 Personal history of malignant neoplasm of breast: Secondary | ICD-10-CM

## 2019-09-28 DIAGNOSIS — R928 Other abnormal and inconclusive findings on diagnostic imaging of breast: Secondary | ICD-10-CM | POA: Diagnosis not present

## 2019-09-28 HISTORY — DX: Personal history of irradiation: Z92.3

## 2019-10-06 ENCOUNTER — Encounter: Payer: Self-pay | Admitting: Cardiovascular Disease

## 2019-10-06 ENCOUNTER — Ambulatory Visit: Payer: Medicare PPO | Admitting: Cardiovascular Disease

## 2019-10-06 ENCOUNTER — Other Ambulatory Visit: Payer: Self-pay

## 2019-10-06 VITALS — BP 138/86 | HR 80 | Ht 64.0 in | Wt 163.4 lb

## 2019-10-06 DIAGNOSIS — E782 Mixed hyperlipidemia: Secondary | ICD-10-CM | POA: Diagnosis not present

## 2019-10-06 DIAGNOSIS — E785 Hyperlipidemia, unspecified: Secondary | ICD-10-CM | POA: Insufficient documentation

## 2019-10-06 DIAGNOSIS — R931 Abnormal findings on diagnostic imaging of heart and coronary circulation: Secondary | ICD-10-CM

## 2019-10-06 DIAGNOSIS — Z91018 Allergy to other foods: Secondary | ICD-10-CM | POA: Diagnosis not present

## 2019-10-06 NOTE — Patient Instructions (Signed)
Medication Instructions:  Your physician recommends that you continue on your current medications as directed. Please refer to the Current Medication list given to you today.  Testing/Procedures: Your physician has requested that you have a lexiscan myoview. For further information please visit [email or web address]. Please follow instruction sheet, as given.   Follow-Up: At [facility], you and your health needs are our priority.  As part of our continuing mission to provide you with exceptional heart care, we have created designated Provider Care Teams.  These Care Teams include your primary Cardiologist (physician) and Advanced Practice Providers (APPs -  Physician Assistants and Nurse Practitioners) who all work together to provide you with the care you need, when you need it.  We recommend signing up for the patient portal called "MyChart".  Sign up information is provided on this After Visit Summary.  MyChart is used to connect with patients for Virtual Visits (Telemedicine).  Patients are able to view lab/test results, encounter notes, upcoming appointments, etc.  Non-urgent messages can be sent to your provider as well.   To learn more about what you can do with MyChart, go to [email or web address].    Your next appointment:   With pharmacist (lipid clinic)  As needed with Dr. Gwenlyn Found --if you start on medication for cholesterol, Dr. Gwenlyn Found will see you back in 3 months with labs prior

## 2019-10-06 NOTE — Assessment & Plan Note (Signed)
Recent coronary calcium score performed by Dr. Shelia Media was 56 with calcium in all 3 coronary arteries although the patient is completely asymptomatic and is very active.  I am going to get a baseline pharmacologic Myoview stress test to determine physiologic significance.  Based on the elevated coronary calcium score, I do feel she needs more aggressive treatment of her hyperlipidemia however.

## 2019-10-06 NOTE — Assessment & Plan Note (Signed)
History of hyperlipidemia on statin therapy which she is intolerant to (low-dose Crestor).  Given her elevated coronary calcium score I am going refer her to our Pharm.D. to explore initiating PCSK9 therapy.

## 2019-10-06 NOTE — Progress Notes (Signed)
10/06/2019 Michele [name]   29-Aug-1940  [id number]  Primary Physician Deland Pretty, MD Primary Cardiologist: Lorretta Harp MD [name], [place]  HPI:  Michele [name] is a 79 y.o.   mildly overweight married Caucasian female mother of 2, grandmother 2 grandchildren whose husband Michele [name] is also a patient of mine.  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  She does have a family history of heart disease with a father who died of a myocardial infarction at age 33.  She has no other risk factors other than hyperlipidemia on low-dose rosuvastatin although she seems to be statin intolerant.  She is very active and does swimming, yoga and works in her yard outside without symptoms.  Recent coronary calcium score ordered by Dr. Shelia Media was 93 with calcium in all 3 coronary arteries although she is completely asymptomatic.   Current Meds  Medication Sig  . Cholecalciferol (VITAMIN D3) 1000 units CAPS Take by mouth daily.  . Citalopram Hydrobromide (CELEXA PO) Take 10 mg by mouth as needed.  . fluticasone (VERAMYST) 27.5 MCG/SPRAY nasal spray Place 2 sprays into the nose daily.  Marland Kitchen levocetirizine (XYZAL) 5 MG tablet Take 5 mg by mouth every evening.  . rosuvastatin (CRESTOR) 5 MG tablet   . triamcinolone ointment (KENALOG) 0.1 %   . TURMERIC PO Take by mouth daily.     Allergies  Allergen Reactions  . Azithromycin Rash  . Influenza Vaccines Rash    'flu like symptoms for 24 hrs'  . Other Rash    'flu like symptoms for 24 hrs'  . Reglan [Metoclopramide] Diarrhea    Social History   Socioeconomic History  . Marital status: Married    Spouse name: Not on file  . Number of children: Not on file  . Years of education: Not on file  . Highest education level: Not on file  Occupational History  . Not on file  Tobacco Use  . Smoking status: Former Smoker    Types: Cigarettes    Start date: 07/18/1956    Quit date: 07/19/1982     Years since quitting: 37.2  . Smokeless tobacco: Former Network engineer and Sexual Activity  . Alcohol use: Yes    Alcohol/week: 2.0 standard drinks    Types: 1 Glasses of wine, 1 Shots of liquor per week    Comment: 2 in the eveing before dinner  . Drug use: No  . Sexual activity: Never  Other Topics Concern  . Not on file  Social History Narrative  . Not on file   Social Determinants of Health   Financial Resource Strain:   . Difficulty of Paying Living Expenses:   Food Insecurity:   . Worried About Charity fundraiser in the Last Year:   . Arboriculturist in the Last Year:   Transportation Needs:   . Film/video editor (Medical):   Marland Kitchen Lack of Transportation (Non-Medical):   Physical Activity:   . Days of Exercise per Week:   . Minutes of Exercise per Session:   Stress:   . Feeling of Stress :   Social Connections:   . Frequency of Communication with Friends and Family:   . Frequency of Social Gatherings with Friends and Family:   . Attends Religious Services:   . Active Member of Clubs or Organizations:   . Attends Archivist Meetings:   Marland Kitchen Marital Status:  Intimate Partner Violence:   . Fear of Current or Ex-Partner:   . Emotionally Abused:   Marland Kitchen Physically Abused:   . Sexually Abused:      Review of Systems: General: negative for chills, fever, night sweats or weight changes.  Cardiovascular: negative for chest pain, dyspnea on exertion, edema, orthopnea, palpitations, paroxysmal nocturnal dyspnea or shortness of breath Dermatological: negative for rash Respiratory: negative for cough or wheezing Urologic: negative for hematuria Abdominal: negative for nausea, vomiting, diarrhea, bright red blood per rectum, melena, or hematemesis Neurologic: negative for visual changes, syncope, or dizziness All other systems reviewed and are otherwise negative except as noted above.    Blood pressure 138/86, pulse 80, height 5\' 4"  (1.626 m), weight 163 lb 6.4  oz (74.1 kg), SpO2 95 %.  General appearance: alert and no distress Neck: no adenopathy, no carotid bruit, no JVD, supple, symmetrical, trachea midline and thyroid not enlarged, symmetric, no tenderness/mass/nodules Lungs: clear to auscultation bilaterally Heart: regular rate and rhythm, S1, S2 normal, no murmur, click, rub or gallop Extremities: extremities normal, atraumatic, no cyanosis or edema Pulses: 2+ and symmetric Skin: Skin color, texture, turgor normal. No rashes or lesions Neurologic: Alert and oriented X 3, normal strength and tone. Normal symmetric reflexes. Normal coordination and gait  EKG sinus rhythm at 80 with left axis deviation.  I personally reviewed this EKG.  ASSESSMENT AND PLAN:   Hyperlipidemia History of hyperlipidemia on statin therapy which she is intolerant to (low-dose Crestor).  Given her elevated coronary calcium score I am going refer her to our Pharm.D. to explore initiating PCSK9 therapy.  Elevated coronary artery calcium score Recent coronary calcium score performed by Dr. Shelia Media was 67 with calcium in all 3 coronary arteries although the patient is completely asymptomatic and is very active.  I am going to get a baseline pharmacologic Myoview stress test to determine physiologic significance.  Based on the elevated coronary calcium score, I do feel she needs more aggressive treatment of her hyperlipidemia however.      Lorretta Harp MD FACP,FACC,FAHA, FSCAI 10/06/2019 10:00 AM

## 2019-10-08 ENCOUNTER — Other Ambulatory Visit: Payer: Self-pay

## 2019-10-08 ENCOUNTER — Ambulatory Visit: Payer: Medicare PPO | Admitting: Pharmacist

## 2019-10-08 VITALS — BP 124/54 | HR 80 | Resp 16 | Ht 64.0 in | Wt 163.8 lb

## 2019-10-08 DIAGNOSIS — T466X5A Adverse effect of antihyperlipidemic and antiarteriosclerotic drugs, initial encounter: Secondary | ICD-10-CM | POA: Diagnosis not present

## 2019-10-08 DIAGNOSIS — E782 Mixed hyperlipidemia: Secondary | ICD-10-CM | POA: Diagnosis not present

## 2019-10-08 DIAGNOSIS — R931 Abnormal findings on diagnostic imaging of heart and coronary circulation: Secondary | ICD-10-CM | POA: Diagnosis not present

## 2019-10-08 DIAGNOSIS — G720 Drug-induced myopathy: Secondary | ICD-10-CM | POA: Diagnosis not present

## 2019-10-08 NOTE — Patient Instructions (Addendum)
Your Results:             Your most recent labs Goal  Total Cholesterol 240 < 200  Triglycerides 76 < 150  HDL (good cholesterol) 58 > 40  LDL (bad cholesterol 79 < 70      Medication changes: *Decrease rosuvastatin dose to 5mg  every Monday, Wednesday and Fridays* Will decrease to 5mg  weekly once PCSK9i started*  *Will start prior-authorization for Repatha/Praleunt every 14 days*  Lab orders: *Plan repeat fasting blood work after 4th dose of new medication*  Clinic phone number: [phone]  MyChart: Username: [email or web address] Password: [email or web address]  Patient Assistance:  The [employer] foundation offers assistance to help pay for medication copays.  They will cover copays for all cholesterol lowering meds, including statins, fibrates, omega-3 oils, ezetimibe, Repatha, Praluent, Nexletol, Nexlizet.  The cards are usually good for $2,500 or 12 months, whichever comes first. 1. Go to healthwellfoundation.org 2. Click on "Apply Now" 3. Answer questions as to whom is applying (patient or representative) 4. Your disease fund will be "hypercholesterolemia - Medicare access" 5. They will ask questions about finances and which medications you are taking for cholesterol 6. When you submit, the approval is usually within minutes.  You will need to print the card information from the site 7. You will need to show this information to your pharmacy, they will bill your Medicare Part D plan first -then bill [employer] --for the copay.   You can also call them at [phone], although the hold times can be quite long.   Thank you for choosing [facility]

## 2019-10-08 NOTE — Progress Notes (Signed)
Patient ID: Michele [name]                 DOB: 1940-08-08                    MRN: [id number]     HPI: Michele [name] is a 79 y.o. female patient referred to lipid clinic by Dr. Gwenlyn Found. PMH is significant for family history of ASCVD, hyperlipidemia, calcium score of 691 (89% per [facility] calculator), and intolerant rosuvastatin and atorvastatin. Currently on low dose rosuvastatin, but continues to experience some joint pain.  Current Medications:  Rosuvastatin 5mg  every other days (joint pain, muscle weakness)  Intolerances:  Rosuvastatin 10mg  - caused pain and general malaise in the past lipitor - severe muscle pain  LDL goal: <70%  Diet: low sodium, low fat, fresh fruits and vegetables  Exercise: swim, yoga, and gardening  Family History: family hs of heart disease in father (MI at age 24)  Social History: former smoker, drink 2 standard alcoholoic drinks per day  Labs:  09/23/2019: LDL-c 79, TG 76, HDL 58 (rosuvastatin 5mg  every other day)  Past Medical History:  Diagnosis Date  . Anxiety   . Asthma    only as a child; went away by age [age]  . Depression   . GERD (gastroesophageal reflux disease)   . Personal history of radiation therapy   . Seasonal allergies     Current Outpatient Medications on File Prior to Visit  Medication Sig Dispense Refill  . Cholecalciferol (VITAMIN D3) 1000 units CAPS Take by mouth daily.    . Citalopram Hydrobromide (CELEXA PO) Take 10 mg by mouth as needed.    . fluticasone (VERAMYST) 27.5 MCG/SPRAY nasal spray Place 2 sprays into the nose daily.    Marland Kitchen levocetirizine (XYZAL) 5 MG tablet Take 5 mg by mouth every evening.    . rosuvastatin (CRESTOR) 5 MG tablet Take 5 mg by mouth. Every Monday, Wednesday and Friday    . triamcinolone ointment (KENALOG) 0.1 %     . TURMERIC PO Take by mouth daily.     No current facility-administered medications on file prior to visit.    Allergies  Allergen Reactions  . Azithromycin Rash  . Influenza  Vaccines Rash    'flu like symptoms for 24 hrs'  . Other Rash    'flu like symptoms for 24 hrs'  . Reglan [Metoclopramide] Diarrhea    Hyperlipidemia LDL remains above goal on this high risk patient with elevated calcium score ( 89 percentile per [facility] calculator), and family hx of cardiovascular disease. Patient is intolerant to moderate and high intensity statins with failed trial including atorvastatin and rosuvastatin 10mg  daily. She is currently taking rosuvastatin 5mg  every other day , but continues to experience joint pain. She exercises frequently and follows a low fat and low sodium diet.   We discussed PCSK9i therapy (Repatha/Praleunt), MOA, storage, administration, common side effects, monitoring, pre-authorization process, and patient assistance available.  Patient agrees to pursue PCSK9i recommendation. We will start PA process and follow up with patient as needed. Plan to repeat fasting blood work after 4 injections of new medication received.     Ricci Dirocco Rodriguez-Guzman PharmD, BCPS, [place] [address] ,[place] [address] 10/09/2019 9:30 AM

## 2019-10-09 ENCOUNTER — Encounter: Payer: Self-pay | Admitting: Pharmacist

## 2019-10-09 DIAGNOSIS — T466X5A Adverse effect of antihyperlipidemic and antiarteriosclerotic drugs, initial encounter: Secondary | ICD-10-CM | POA: Insufficient documentation

## 2019-10-09 DIAGNOSIS — G720 Drug-induced myopathy: Secondary | ICD-10-CM | POA: Insufficient documentation

## 2019-10-09 NOTE — Assessment & Plan Note (Signed)
LDL remains above goal on this high risk patient with elevated calcium score ( 89 percentile per [facility] calculator), and family hx of cardiovascular disease. Patient is intolerant to moderate and high intensity statins with failed trial including atorvastatin and rosuvastatin 10mg  daily. She is currently taking rosuvastatin 5mg  every other day , but continues to experience joint pain. She exercises frequently and follows a low fat and low sodium diet.   We discussed PCSK9i therapy (Repatha/Praleunt), MOA, storage, administration, common side effects, monitoring, pre-authorization process, and patient assistance available.  Patient agrees to pursue PCSK9i recommendation. We will start PA process and follow up with patient as needed. Plan to repeat fasting blood work after 4 injections of new medication received.

## 2019-10-13 ENCOUNTER — Telehealth (HOSPITAL_COMMUNITY): Payer: Self-pay

## 2019-10-13 NOTE — Telephone Encounter (Signed)
Encounter complete. 

## 2019-10-14 ENCOUNTER — Telehealth (HOSPITAL_COMMUNITY): Payer: Self-pay | Admitting: *Deleted

## 2019-10-14 NOTE — Telephone Encounter (Signed)
Close encounter 

## 2019-10-15 ENCOUNTER — Other Ambulatory Visit: Payer: Self-pay

## 2019-10-15 ENCOUNTER — Encounter (HOSPITAL_COMMUNITY): Payer: Medicare PPO

## 2019-10-15 ENCOUNTER — Ambulatory Visit (HOSPITAL_COMMUNITY)
Admission: RE | Admit: 2019-10-15 | Discharge: 2019-10-15 | Disposition: A | Discharge status: Home / Self Care | Payer: Medicare PPO | Source: Ambulatory Visit | Attending: Cardiology | Admitting: Cardiology

## 2019-10-15 DIAGNOSIS — R931 Abnormal findings on diagnostic imaging of heart and coronary circulation: Secondary | ICD-10-CM | POA: Diagnosis not present

## 2019-10-15 DIAGNOSIS — Z853 Personal history of malignant neoplasm of breast: Secondary | ICD-10-CM | POA: Diagnosis not present

## 2019-10-15 DIAGNOSIS — Z87891 Personal history of nicotine dependence: Secondary | ICD-10-CM | POA: Insufficient documentation

## 2019-10-15 DIAGNOSIS — K219 Gastro-esophageal reflux disease without esophagitis: Secondary | ICD-10-CM | POA: Insufficient documentation

## 2019-10-15 DIAGNOSIS — Z136 Encounter for screening for cardiovascular disorders: Secondary | ICD-10-CM | POA: Insufficient documentation

## 2019-10-15 DIAGNOSIS — Z8249 Family history of ischemic heart disease and other diseases of the circulatory system: Secondary | ICD-10-CM | POA: Insufficient documentation

## 2019-10-15 LAB — MYOCARDIAL PERFUSION IMAGING
LV dias vol: 68 mL (ref 46–106)
LV sys vol: 25 mL
Peak HR: 81 {beats}/min
Rest HR: 62 {beats}/min
SDS: 3
SRS: 0
SSS: 3
TID: 1.25

## 2019-10-15 MED ORDER — REGADENOSON 0.4 MG/5ML IV SOLN
0.4000 mg | Freq: Once | INTRAVENOUS | Status: AC
  Administered 2019-10-15: 0.4 mg via INTRAVENOUS

## 2019-10-15 MED ORDER — TECHNETIUM TC 99M TETROFOSMIN IV KIT
10.6000 | PACK | Freq: Once | INTRAVENOUS | Status: AC | PRN
  Administered 2019-10-15: 10.6 via INTRAVENOUS
  Filled 2019-10-15: qty 11

## 2019-10-15 MED ORDER — TECHNETIUM TC 99M TETROFOSMIN IV KIT
30.9000 | PACK | Freq: Once | INTRAVENOUS | Status: AC | PRN
  Administered 2019-10-15: 30.9 via INTRAVENOUS
  Filled 2019-10-15: qty 31

## 2019-11-12 DIAGNOSIS — D229 Melanocytic nevi, unspecified: Secondary | ICD-10-CM | POA: Diagnosis not present

## 2019-11-12 DIAGNOSIS — L578 Other skin changes due to chronic exposure to nonionizing radiation: Secondary | ICD-10-CM | POA: Diagnosis not present

## 2019-11-12 DIAGNOSIS — T148XXA Other injury of unspecified body region, initial encounter: Secondary | ICD-10-CM | POA: Diagnosis not present

## 2019-11-12 DIAGNOSIS — L821 Other seborrheic keratosis: Secondary | ICD-10-CM | POA: Diagnosis not present

## 2019-11-12 DIAGNOSIS — Z85828 Personal history of other malignant neoplasm of skin: Secondary | ICD-10-CM | POA: Diagnosis not present

## 2019-11-26 DIAGNOSIS — Z79899 Other long term (current) drug therapy: Secondary | ICD-10-CM | POA: Diagnosis not present

## 2019-12-04 DIAGNOSIS — I2510 Atherosclerotic heart disease of native coronary artery without angina pectoris: Secondary | ICD-10-CM | POA: Diagnosis not present

## 2019-12-04 DIAGNOSIS — R030 Elevated blood-pressure reading, without diagnosis of hypertension: Secondary | ICD-10-CM | POA: Diagnosis not present

## 2019-12-04 DIAGNOSIS — T466X5A Adverse effect of antihyperlipidemic and antiarteriosclerotic drugs, initial encounter: Secondary | ICD-10-CM | POA: Diagnosis not present

## 2019-12-04 DIAGNOSIS — E785 Hyperlipidemia, unspecified: Secondary | ICD-10-CM | POA: Diagnosis not present

## 2019-12-04 DIAGNOSIS — M791 Myalgia, unspecified site: Secondary | ICD-10-CM | POA: Diagnosis not present

## 2019-12-08 ENCOUNTER — Telehealth: Payer: Self-pay

## 2019-12-08 DIAGNOSIS — E782 Mixed hyperlipidemia: Secondary | ICD-10-CM

## 2019-12-08 MED ORDER — REPATHA SURECLICK 140 MG/ML ~~LOC~~ SOAJ: 140.0000 mg | SUBCUTANEOUS | 11 refills | Status: DC

## 2019-12-08 NOTE — Telephone Encounter (Signed)
Called and spoke w/pt stated that they were approved for repatha, rx sent, pt instructed to come for labs after 4th injection, orders placed, and pt voiced understanding

## 2019-12-11 DIAGNOSIS — M791 Myalgia, unspecified site: Secondary | ICD-10-CM | POA: Diagnosis not present

## 2019-12-11 DIAGNOSIS — E785 Hyperlipidemia, unspecified: Secondary | ICD-10-CM | POA: Diagnosis not present

## 2019-12-11 DIAGNOSIS — I2510 Atherosclerotic heart disease of native coronary artery without angina pectoris: Secondary | ICD-10-CM | POA: Diagnosis not present

## 2019-12-11 DIAGNOSIS — T466X5A Adverse effect of antihyperlipidemic and antiarteriosclerotic drugs, initial encounter: Secondary | ICD-10-CM | POA: Diagnosis not present

## 2019-12-16 DIAGNOSIS — K5900 Constipation, unspecified: Secondary | ICD-10-CM | POA: Diagnosis not present

## 2019-12-17 ENCOUNTER — Telehealth: Payer: Self-pay | Admitting: Gastroenterology

## 2019-12-17 NOTE — Telephone Encounter (Signed)
I haven't seen her for 11 years. That switch is OK with me if it is OK with  Dr. Carlean Purl.

## 2019-12-17 NOTE — Telephone Encounter (Signed)
Hi Dr Ardis Hughs,   Received a referral from PCP for constipation on this patient. It looks like you have had previous encounter with her. Patient is requesting Dr Carlean Purl to continue with her care.   Please advise on approval for scheduling.   Thank you!

## 2019-12-18 NOTE — Telephone Encounter (Signed)
Hi Dr Carlean Purl,   This patient is requesting to be seen by you and Dr Eugenia Pancoast has approved.  Please advise on your approval for scheduling.    Thank you!

## 2019-12-18 NOTE — Telephone Encounter (Signed)
I am happy to see her

## 2019-12-22 NOTE — Telephone Encounter (Signed)
Called to inform patient that both doctors agreed on transfer of care. LM to call us back to make appointment.

## 2019-12-24 DIAGNOSIS — Z1212 Encounter for screening for malignant neoplasm of rectum: Secondary | ICD-10-CM | POA: Diagnosis not present

## 2019-12-30 ENCOUNTER — Encounter: Payer: Self-pay | Admitting: Internal Medicine

## 2020-01-06 ENCOUNTER — Encounter: Payer: Self-pay | Admitting: Cardiovascular Disease

## 2020-01-06 ENCOUNTER — Ambulatory Visit (INDEPENDENT_AMBULATORY_CARE_PROVIDER_SITE_OTHER): Payer: Medicare PPO | Admitting: Cardiovascular Disease

## 2020-01-06 ENCOUNTER — Other Ambulatory Visit: Payer: Self-pay

## 2020-01-06 DIAGNOSIS — E782 Mixed hyperlipidemia: Secondary | ICD-10-CM

## 2020-01-06 DIAGNOSIS — R931 Abnormal findings on diagnostic imaging of heart and coronary circulation: Secondary | ICD-10-CM | POA: Diagnosis not present

## 2020-01-06 NOTE — Patient Instructions (Signed)
Medication Instructions:  Continue current medications  *If you need a refill on your cardiac medications before your next appointment, please call your pharmacy*   Lab Work: None Ordered   Testing/Procedures: None ordered   Follow-Up: At [facility], you and your health needs are our priority.  As part of our continuing mission to provide you with exceptional heart care, we have created designated Provider Care Teams.  These Care Teams include your primary Cardiologist (physician) and Advanced Practice Providers (APPs -  Physician Assistants and Nurse Practitioners) who all work together to provide you with the care you need, when you need it.  We recommend signing up for the patient portal called "MyChart".  Sign up information is provided on this After Visit Summary.  MyChart is used to connect with patients for Virtual Visits (Telemedicine).  Patients are able to view lab/test results, encounter notes, upcoming appointments, etc.  Non-urgent messages can be sent to your provider as well.   To learn more about what you can do with MyChart, go to [email or web address].    Your next appointment:   1 year(s)  The format for your next appointment:   In Person  Provider:   You may see Quay Burow, MD or one of the following Advanced Practice Providers on your designated Care Team:    Kerin Ransom, PA-C  [place], [place]  Coletta Memos, [place]

## 2020-01-06 NOTE — Assessment & Plan Note (Signed)
History of elevated coronary calcium score of 691 with a subsequent negative Myoview stress test performed 10/15/2019.  She is completely asymptomatic.

## 2020-01-06 NOTE — Progress Notes (Signed)
01/06/2020 [name]   [birth year]  [id number]  Primary Physician Deland Pretty, MD Primary Cardiologist: Lorretta Harp MD [name], [place]  HPI:  Michele [name] is a 79 y.o.  mildly overweight married Caucasian female mother of 2, grandmother 2 grandchildren whose husband [name] is also a patient of mine.  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  I last saw her in the office 10/06/2019. She does have a family history of heart disease with a father who died of a myocardial infarction at age 24.  She has no other risk factors other than hyperlipidemia on low-dose rosuvastatin although she seems to be statin intolerant.  She is very active and does swimming, yoga and works in her yard outside without symptoms.  Recent coronary calcium score ordered by Dr. Shelia Media was 32 with calcium in all 3 coronary arteries although she is completely asymptomatic.  Since I saw her 3 months ago I did get a Myoview stress test on her 10/15/2019 which was entirely normal.  Because of statin intolerance she was begun on Repatha with an excellent result.   Current Meds  Medication Sig  . Cholecalciferol (VITAMIN D3) 1000 units CAPS Take by mouth daily.  . Citalopram Hydrobromide (CELEXA PO) Take 10 mg by mouth as needed.  . Evolocumab (REPATHA SURECLICK) 010 MG/ML SOAJ Inject 140 mg into the skin every 14 (fourteen) days.  . fluticasone (VERAMYST) 27.5 MCG/SPRAY nasal spray Place 2 sprays into the nose daily.  Marland Kitchen levocetirizine (XYZAL) 5 MG tablet Take 5 mg by mouth as needed.   . triamcinolone ointment (KENALOG) 0.1 %   . TURMERIC PO Take by mouth daily.     Allergies  Allergen Reactions  . Azithromycin Rash  . Influenza Vaccines Rash    'flu like symptoms for 24 hrs'  . Other Rash    'flu like symptoms for 24 hrs'  . Reglan [Metoclopramide] Diarrhea    Social History   Socioeconomic History  . Marital status: Married    Spouse  name: Not on file  . Number of children: Not on file  . Years of education: Not on file  . Highest education level: Not on file  Occupational History  . Not on file  Tobacco Use  . Smoking status: Former Smoker    Types: Cigarettes    Start date: 07/18/1956    Quit date: 07/19/1982    Years since quitting: 37.4  . Smokeless tobacco: Former Network engineer and Sexual Activity  . Alcohol use: Yes    Alcohol/week: 2.0 standard drinks    Types: 1 Glasses of wine, 1 Shots of liquor per week    Comment: 2 in the eveing before dinner  . Drug use: No  . Sexual activity: Never  Other Topics Concern  . Not on file  Social History Narrative  . Not on file   Social Determinants of Health   Financial Resource Strain:   . Difficulty of Paying Living Expenses: Not on file  Food Insecurity:   . Worried About Charity fundraiser in the Last Year: Not on file  . Ran Out of Food in the Last Year: Not on file  Transportation Needs:   . Lack of Transportation (Medical): Not on file  . Lack of Transportation (Non-Medical): Not on file  Physical Activity:   . Days of Exercise per Week: Not on file  . Minutes of Exercise  per Session: Not on file  Stress:   . Feeling of Stress : Not on file  Social Connections:   . Frequency of Communication with Friends and Family: Not on file  . Frequency of Social Gatherings with Friends and Family: Not on file  . Attends Religious Services: Not on file  . Active Member of Clubs or Organizations: Not on file  . Attends Archivist Meetings: Not on file  . Marital Status: Not on file  Intimate Partner Violence:   . Fear of Current or Ex-Partner: Not on file  . Emotionally Abused: Not on file  . Physically Abused: Not on file  . Sexually Abused: Not on file     Review of Systems: General: negative for chills, fever, night sweats or weight changes.  Cardiovascular: negative for chest pain, dyspnea on exertion, edema, orthopnea, palpitations,  paroxysmal nocturnal dyspnea or shortness of breath Dermatological: negative for rash Respiratory: negative for cough or wheezing Urologic: negative for hematuria Abdominal: negative for nausea, vomiting, diarrhea, bright red blood per rectum, melena, or hematemesis Neurologic: negative for visual changes, syncope, or dizziness All other systems reviewed and are otherwise negative except as noted above.    Blood pressure 128/62, pulse 75, height 5\' 4"  (1.626 m), weight 163 lb 6.4 oz (74.1 kg), SpO2 96 %.  General appearance: alert and no distress Neck: no adenopathy, no carotid bruit, no JVD, supple, symmetrical, trachea midline and thyroid not enlarged, symmetric, no tenderness/mass/nodules Lungs: clear to auscultation bilaterally Heart: regular rate and rhythm, S1, S2 normal, no murmur, click, rub or gallop Extremities: extremities normal, atraumatic, no cyanosis or edema Pulses: 2+ and symmetric Skin: Skin color, texture, turgor normal. No rashes or lesions Neurologic: Alert and oriented X 3, normal strength and tone. Normal symmetric reflexes. Normal coordination and gait  EKG not performed today  ASSESSMENT AND PLAN:   Hyperlipidemia History of hyperlipidemia intolerant to statin therapy recently started on Repatha with an LDL of 79 on 09/15/2019.  Elevated coronary artery calcium score History of elevated coronary calcium score of 691 with a subsequent negative Myoview stress test performed 10/15/2019.  She is completely asymptomatic.      Lorretta Harp MD FACP,FACC,FAHA, [facility] 01/06/2020 11:33 AM

## 2020-01-06 NOTE — Assessment & Plan Note (Signed)
History of hyperlipidemia intolerant to statin therapy recently started on Repatha with an LDL of 79 on 09/15/2019.

## 2020-02-15 ENCOUNTER — Encounter: Payer: Self-pay | Admitting: Internal Medicine

## 2020-02-15 ENCOUNTER — Ambulatory Visit (INDEPENDENT_AMBULATORY_CARE_PROVIDER_SITE_OTHER): Payer: Medicare PPO | Admitting: Internal Medicine

## 2020-02-15 VITALS — BP 140/70 | HR 84 | Ht 67.75 in | Wt 165.0 lb

## 2020-02-15 DIAGNOSIS — K5900 Constipation, unspecified: Secondary | ICD-10-CM | POA: Diagnosis not present

## 2020-02-15 DIAGNOSIS — Q438 Other specified congenital malformations of intestine: Secondary | ICD-10-CM | POA: Diagnosis not present

## 2020-02-15 NOTE — Patient Instructions (Signed)
Please try one tablespoon of Benefiber daily instead of the Miralax. Handout provided.   We are giving you information to read on High fiber diets.   Follow up with Dr Carlean Purl as needed.   I appreciate the opportunity to care for you. Silvano Rusk, MD, [facility]

## 2020-02-15 NOTE — Progress Notes (Signed)
Michele [name] 79 y.o. 1940/09/27 [id number]  Assessment & Plan:   Encounter Diagnoses  Name Primary?  . Acute constipation Yes  . Redundant colon      She is improved.  I do not think any investigation is necessary based upon what we know.  She will follow-up as needed   consider taking fiber supplement with Benefiber on a regular basis.  I appreciate the opportunity to care for this patient.  CC: Deland Pretty, MD   Subjective:   Chief Complaint: Constipation  HPI  Patient is an 79 year old woman with a history of recent constipation problems fairly severe in the setting of some situational stress related to a friend dying and she has another one sick.  She saw Dr. Shelia Media and started [facility] and she is really back to normal now.  Using MiraLAX as needed.  She had some pebble-like stools but no change in stool caliber that was progressive or bleeding.  She would go up to 3 days without defecation at that time.  She does think she eats a lot of fresh fruits and vegetables salads.  She does not take a fiber supplement.  She has had some difficulty with constipation over the years but this was more severe.  I have reviewed the office note from 12/16/2019 where she saw Dr. Shelia Media.     She was previously followed by Dr. Lyla Son and then had a colonoscopy by Dr. Oretha Caprice in 2010.  Findings as below:   Very tortuous sigmoid colon (this was also documented by Dr. Velora Heckler in 2004). Unable to pass this region of colon so the examination was incomplete.  RECOMMENDATIONS:  1) My office will arrange to you to have a barium enema performed. This is a radiology test to further examine your colon.   Barium enema double contrast The sigmoid colon is redundant. The colon is evaluated from the  level of the rectum to the cecum, and there is filling of the  terminal ileum and the appendix. No polyps, masses, obstruction,  or stricture is identified. There are scattered small   diverticulae.    IMPRESSION:    1.  Diverticulosis.  2.  No colonic polyps, mass, or obstruction identified.    We reviewed the barium enema images and can see how redundant her colon is.  She had a Cologuard done in 2019 that was negative and fecal occult blood testing was negative this year.  She is 79 but vigorous.    Wt Readings from Last 3 Encounters:  02/15/20 165 lb (74.8 kg)  01/06/20 163 lb 6.4 oz (74.1 kg)  10/15/19 163 lb (73.9 kg)     GI review of systems otherwise negative except for very occasional heartburn belching and flatulence issues of bloating. Allergies  Allergen Reactions  . Azithromycin Rash  . Alpha-Gal Other (See Comments)    Mammalian meat allergy from a [place] tick bite, 02/15/2020 states she has been eating meat for the last year and alpha gal seems to have gone away  . Influenza Vaccines Rash    'flu like symptoms for 24 hrs'  . Reglan [Metoclopramide] Diarrhea   Current Meds  Medication Sig  . aspirin EC 81 MG tablet Take 81 mg by mouth daily. Swallow whole.  . Cholecalciferol (VITAMIN D3) 1000 units CAPS Take by mouth daily.  . Citalopram Hydrobromide (CELEXA PO) Take 10 mg by mouth as needed.  . Evolocumab (REPATHA SURECLICK) 825 MG/ML SOAJ Inject 140 mg into the skin  every 14 (fourteen) days.  . fluticasone (VERAMYST) 27.5 MCG/SPRAY nasal spray Place 2 sprays into the nose as needed.  Marland Kitchen levocetirizine (XYZAL) 5 MG tablet Take 5 mg by mouth as needed.   . polyethylene glycol powder (GLYCOLAX/MIRALAX) 17 GM/SCOOP powder Take 17 g by mouth as needed.  . triamcinolone ointment (KENALOG) 0.1 %    Past Medical History:  Diagnosis Date  . Allergic rhinitis   . Anal fissure   . Anxiety   . Asthma    only as a child; went away by age [age]  . Breast cancer ([place])   . Calcified granuloma of lung ([place])   . Cancer of the skin, basal cell   . Coronary artery disease   . Depression   . GERD (gastroesophageal reflux disease)   .  Hyperglycemia   . Hyperlipidemia   . IBS (irritable bowel syndrome)   . Nicotine dependence   . OA (osteoarthritis) of knee   . Osteopenia   . Personal history of radiation therapy   . Seasonal affective disorder ([place])   . Seasonal allergies   . Status post dilation of esophageal narrowing    Past Surgical History:  Procedure Laterality Date  . BREAST BIOPSY Left 08/2017  . BREAST LUMPECTOMY Left 10/2017  . BREAST LUMPECTOMY WITH NEEDLE LOCALIZATION Left 11/08/2017   Procedure: LEFT BREAST SUBAREOLAR BIOPSY WITH NEEDLE LOCALIZATION;  Surgeon: [name], MD;  Location: [place];  Service: General;  Laterality: Left;  . BREAST LUMPECTOMY WITH RADIOACTIVE SEED LOCALIZATION Left 11/08/2017   Procedure: LEFT BREAST LUMPECTOMY WITH RADIOACTIVE SEED LOCALIZATION X 2;  Surgeon: [name], MD;  Location: [place];  Service: General;  Laterality: Left;  . BROW LIFT    . CATARACT EXTRACTION Bilateral   . COLONOSCOPY  2010   Incomplete PE negative redundant colon  . TONSILLECTOMY     age 17   Social History   Social History Narrative   Married 1 son 1 daughter   Retired Nurse, children's   2 alcoholic beverages daily former smoker no caffeine no drug use no tobacco now   family history includes Asthma in her mother; Breast cancer in her sister; COPD in her mother and sister; Cancer in her sister; Heart attack in her father; Thyroid disease in her daughter.   Review of Systems  See HPI.  Some allergy and sinus issues all other review of systems negative. Objective:   Physical Exam BP 140/70 (BP Location: Left Arm, Patient Position: Sitting, Cuff Size: Normal)   Pulse 84   Ht 5' 7.75" (1.721 m) Comment: height measured without shoes  Wt 165 lb (74.8 kg)   BMI 25.27 kg/m  NAD elderly ww  Abdomen is soft and nontender w/o HSM/mass/hernia  Patti Martinique, CMA present.   Anoderm inspection revealed small amount of stool  stain Anal wink was + Digital exam revealed normal resting tone and voluntary squeeze. No mass or rectocele present. Simulated defecation with valsalva revealed appropriate abdominal contraction and descent.

## 2020-02-16 ENCOUNTER — Encounter: Payer: Self-pay | Admitting: Internal Medicine

## 2020-02-24 DIAGNOSIS — J3081 Allergic rhinitis due to animal (cat) (dog) hair and dander: Secondary | ICD-10-CM | POA: Diagnosis not present

## 2020-02-24 DIAGNOSIS — Z91018 Allergy to other foods: Secondary | ICD-10-CM | POA: Diagnosis not present

## 2020-02-24 DIAGNOSIS — J3089 Other allergic rhinitis: Secondary | ICD-10-CM | POA: Diagnosis not present

## 2020-03-22 NOTE — Assessment & Plan Note (Signed)
11/08/2017:Twoleft lumpectomies: DCIS high-grade 0.9 cm focally less than 0.1 cm to medial margin; second lumpectomy left subareolar: DCIS with calcifications high-grade 1.2 cm focally less than 0.1 cm superior margin and focally, 0.1 cm to anterior margin ER 0%, PR 0% for both Tis NX stage 0 Adjuvant radiation therapy completed 01/13/2018 No role of antiestrogen therapy because she is ER PR negative  Breast cancer surveillance: 1.Mammograms  09/28/2019: No evidence of recurrent breast cancer. Breast density category B 2.Breast exam  03/23/2020: Benign  Return to clinic in 1 year for follow-up and surveillance

## 2020-03-22 NOTE — Progress Notes (Signed)
Patient Care Team: Deland Pretty, MD as PCP - General (Internal Medicine) [name], MD as Consulting Physician (General Surgery) Nicholas Lose, MD as Consulting Physician (Hematology and Oncology) Kyung Rudd, MD as Consulting Physician (Radiation Oncology) Delice Bison Charlestine Massed, NP as Nurse Practitioner (Hematology and Oncology)  DIAGNOSIS:    ICD-10-CM   1. Ductal carcinoma in situ (DCIS) of left breast  D05.12     SUMMARY OF ONCOLOGIC HISTORY: Oncology History  Ductal carcinoma in situ (DCIS) of left breast  09/30/2017 Initial Diagnosis   Screening detected left breast calcifications 2 groups upper outer quadrant 1.5 cm and 1.2 cm both of which are biopsy-proven high-grade DCIS with calcifications ER PR negative.  Underneath the nipple 2 groups of calcifications.  Posterior group was biopsy-proven fibrocystic change anterior group could not be biopsied.  Tis NX stage 0   11/08/2017 Surgery   2 left lumpectomies: DCIS high-grade 0.9 cm focally less than 0.1 cm to medial margin; second lumpectomy left subareolar: DCIS with calcifications high-grade 1.2 cm focally less than 0.1 cm superior margin and focally,  0.1 cm to anterior margin ER 0%, PR 0% for both Tis NX stage 0   12/18/2017 - 01/13/2018 Radiation Therapy   Adjuvant radiation therapy     CHIEF COMPLIANT: Surveillance of left breast DCIS  INTERVAL HISTORY: Michele [name] is a 80 y.o. with above-mentioned history of left breastDCIS treatedwith two lumpectomies, radiation, and is currently on surveillance.Mammogram on 09/28/19 showed no evidence of malignancy bilaterally. She presents to the clinictoday for follow-up.    She reports no pain or discomfort in the breast.  She is exercising regularly with yoga and walking.  She hopes to get back into the pool spring or summer and start swimming.  ALLERGIES:  is allergic to azithromycin, alpha-gal, influenza vaccines, and reglan [metoclopramide].  MEDICATIONS:   Current Outpatient Medications  Medication Sig Dispense Refill  . Cholecalciferol (VITAMIN D3) 1000 units CAPS Take by mouth daily.    . Citalopram Hydrobromide (CELEXA PO) Take 10 mg by mouth as needed.    . Evolocumab (REPATHA SURECLICK) 932 MG/ML SOAJ Inject 140 mg into the skin every 14 (fourteen) days. 2 mL 11  . fluticasone (VERAMYST) 27.5 MCG/SPRAY nasal spray Place 2 sprays into the nose as needed.    Marland Kitchen levocetirizine (XYZAL) 5 MG tablet Take 5 mg by mouth as needed.     . polyethylene glycol powder (GLYCOLAX/MIRALAX) 17 GM/SCOOP powder Take 17 g by mouth as needed.    . triamcinolone ointment (KENALOG) 0.1 %      No current facility-administered medications for this visit.    PHYSICAL EXAMINATION: ECOG PERFORMANCE STATUS: 1 - Symptomatic but completely ambulatory  Vitals:   03/23/20 1041  BP: 132/64  Pulse: 91  Resp: 17  Temp: (!) 97.2 F (36.2 C)  SpO2: 98%   Filed Weights   03/23/20 1041  Weight: 164 lb 9.6 oz (74.7 kg)    BREAST: No palpable masses or nodules in either right or left breasts. No palpable axillary supraclavicular or infraclavicular adenopathy no breast tenderness or nipple discharge. (exam performed in the presence of a chaperone)  LABORATORY DATA:  I have reviewed the data as listed CMP Latest Ref Rng & Units 10/09/2017 11/17/2014  Glucose 70 - 99 mg/dL 113(H) 94  BUN 8 - 23 mg/dL 15 13  Creatinine 0.44 - 1.00 mg/dL 0.88 1.00  Sodium 135 - 145 mmol/L 141 139  Potassium 3.5 - 5.1 mmol/L 4.7 4.9  Chloride 98 -  111 mmol/L 102 97  CO2 22 - 32 mmol/L 28 -  Calcium 8.9 - 10.3 mg/dL 9.2 9.6  Total Protein 6.5 - 8.1 g/dL 7.4 6.9  Total Bilirubin 0.3 - 1.2 mg/dL 0.6 0.5  Alkaline Phos 38 - 126 U/L 55 51  AST 15 - 41 U/L 16 14  ALT 0 - 44 U/L 10 -    Lab Results  Component Value Date   WBC 5.9 10/09/2017   HGB 14.3 10/09/2017   HCT 43.4 10/09/2017   MCV 94.9 10/09/2017   PLT 259 10/09/2017   NEUTROABS 3.7 10/09/2017    ASSESSMENT & PLAN:   Ductal carcinoma in situ (DCIS) of left breast 11/08/2017:Twoleft lumpectomies: DCIS high-grade 0.9 cm focally less than 0.1 cm to medial margin; second lumpectomy left subareolar: DCIS with calcifications high-grade 1.2 cm focally less than 0.1 cm superior margin and focally, 0.1 cm to anterior margin ER 0%, PR 0% for both Tis NX stage 0 Adjuvant radiation therapy completed 01/13/2018 No role of antiestrogen therapy because she is ER PR negative  Breast cancer surveillance: 1.Mammograms  09/28/2019: No evidence of recurrent breast cancer. Breast density category B 2.Breast exam  03/23/2020: Benign  Return to clinic in 1 year for follow-up and surveillance    No orders of the defined types were placed in this encounter.  The patient has a good understanding of the overall plan. she agrees with it. she will call with any problems that may develop before the next visit here.  Total time spent: 20 mins including face to face time and time spent for planning, charting and coordination of care  Nicholas Lose, MD 03/23/2020  I, Cloyde Reams Dorshimer, am acting as scribe for Dr. Nicholas Lose.  I have reviewed the above documentation for accuracy and completeness, and I agree with the above.

## 2020-03-23 ENCOUNTER — Other Ambulatory Visit: Payer: Self-pay

## 2020-03-23 ENCOUNTER — Telehealth: Payer: Self-pay | Admitting: Hematology and Oncology

## 2020-03-23 ENCOUNTER — Inpatient Hospital Stay: Payer: Medicare PPO | Attending: Hematology and Oncology | Admitting: Hematology and Oncology

## 2020-03-23 DIAGNOSIS — D0512 Intraductal carcinoma in situ of left breast: Secondary | ICD-10-CM | POA: Diagnosis not present

## 2020-03-23 DIAGNOSIS — Z86000 Personal history of in-situ neoplasm of breast: Secondary | ICD-10-CM | POA: Diagnosis not present

## 2020-03-23 DIAGNOSIS — Z923 Personal history of irradiation: Secondary | ICD-10-CM | POA: Insufficient documentation

## 2020-03-23 NOTE — Telephone Encounter (Signed)
Scheduled appointment per 1/26 los. Spoke to patient who is aware of appointment date and time. Gave patient calendar print out.

## 2020-05-18 DIAGNOSIS — H9202 Otalgia, left ear: Secondary | ICD-10-CM | POA: Diagnosis not present

## 2020-06-09 DIAGNOSIS — L821 Other seborrheic keratosis: Secondary | ICD-10-CM | POA: Diagnosis not present

## 2020-06-09 DIAGNOSIS — L281 Prurigo nodularis: Secondary | ICD-10-CM | POA: Diagnosis not present

## 2020-06-09 DIAGNOSIS — Z85828 Personal history of other malignant neoplasm of skin: Secondary | ICD-10-CM | POA: Diagnosis not present

## 2020-06-09 DIAGNOSIS — L814 Other melanin hyperpigmentation: Secondary | ICD-10-CM | POA: Diagnosis not present

## 2020-06-09 DIAGNOSIS — L578 Other skin changes due to chronic exposure to nonionizing radiation: Secondary | ICD-10-CM | POA: Diagnosis not present

## 2020-06-09 DIAGNOSIS — L570 Actinic keratosis: Secondary | ICD-10-CM | POA: Diagnosis not present

## 2020-06-09 DIAGNOSIS — L820 Inflamed seborrheic keratosis: Secondary | ICD-10-CM | POA: Diagnosis not present

## 2020-08-02 DIAGNOSIS — K219 Gastro-esophageal reflux disease without esophagitis: Secondary | ICD-10-CM | POA: Diagnosis not present

## 2020-08-02 DIAGNOSIS — E785 Hyperlipidemia, unspecified: Secondary | ICD-10-CM | POA: Diagnosis not present

## 2020-08-02 DIAGNOSIS — R7309 Other abnormal glucose: Secondary | ICD-10-CM | POA: Diagnosis not present

## 2020-08-09 DIAGNOSIS — E785 Hyperlipidemia, unspecified: Secondary | ICD-10-CM | POA: Diagnosis not present

## 2020-08-09 DIAGNOSIS — K5909 Other constipation: Secondary | ICD-10-CM | POA: Diagnosis not present

## 2020-08-09 DIAGNOSIS — J309 Allergic rhinitis, unspecified: Secondary | ICD-10-CM | POA: Diagnosis not present

## 2020-08-09 DIAGNOSIS — M810 Age-related osteoporosis without current pathological fracture: Secondary | ICD-10-CM | POA: Diagnosis not present

## 2020-08-09 DIAGNOSIS — I2510 Atherosclerotic heart disease of native coronary artery without angina pectoris: Secondary | ICD-10-CM | POA: Diagnosis not present

## 2020-08-09 DIAGNOSIS — Z0000 Encounter for general adult medical examination without abnormal findings: Secondary | ICD-10-CM | POA: Diagnosis not present

## 2020-08-09 DIAGNOSIS — K219 Gastro-esophageal reflux disease without esophagitis: Secondary | ICD-10-CM | POA: Diagnosis not present

## 2020-08-30 ENCOUNTER — Other Ambulatory Visit: Payer: Self-pay | Admitting: Hematology and Oncology

## 2020-08-30 DIAGNOSIS — Z1231 Encounter for screening mammogram for malignant neoplasm of breast: Secondary | ICD-10-CM

## 2020-09-20 DIAGNOSIS — E785 Hyperlipidemia, unspecified: Secondary | ICD-10-CM | POA: Diagnosis not present

## 2020-09-20 DIAGNOSIS — I2510 Atherosclerotic heart disease of native coronary artery without angina pectoris: Secondary | ICD-10-CM | POA: Diagnosis not present

## 2020-09-20 DIAGNOSIS — Z23 Encounter for immunization: Secondary | ICD-10-CM | POA: Diagnosis not present

## 2020-09-20 DIAGNOSIS — M810 Age-related osteoporosis without current pathological fracture: Secondary | ICD-10-CM | POA: Diagnosis not present

## 2020-09-20 DIAGNOSIS — M8589 Other specified disorders of bone density and structure, multiple sites: Secondary | ICD-10-CM | POA: Diagnosis not present

## 2020-09-28 ENCOUNTER — Encounter (HOSPITAL_COMMUNITY): Payer: Self-pay | Admitting: Emergency Medicine

## 2020-09-28 ENCOUNTER — Emergency Department (HOSPITAL_COMMUNITY)
Admission: EM | Admit: 2020-09-28 | Discharge: 2020-09-29 | Disposition: A | Discharge status: Home / Self Care | Payer: Medicare PPO | Attending: Emergency Medicine | Admitting: Emergency Medicine

## 2020-09-28 DIAGNOSIS — Z87891 Personal history of nicotine dependence: Secondary | ICD-10-CM | POA: Diagnosis not present

## 2020-09-28 DIAGNOSIS — J45909 Unspecified asthma, uncomplicated: Secondary | ICD-10-CM | POA: Insufficient documentation

## 2020-09-28 DIAGNOSIS — R4701 Aphasia: Secondary | ICD-10-CM | POA: Diagnosis not present

## 2020-09-28 DIAGNOSIS — T7840XA Allergy, unspecified, initial encounter: Secondary | ICD-10-CM | POA: Diagnosis not present

## 2020-09-28 DIAGNOSIS — I2510 Atherosclerotic heart disease of native coronary artery without angina pectoris: Secondary | ICD-10-CM | POA: Diagnosis not present

## 2020-09-28 DIAGNOSIS — R061 Stridor: Secondary | ICD-10-CM | POA: Diagnosis not present

## 2020-09-28 DIAGNOSIS — Z85118 Personal history of other malignant neoplasm of bronchus and lung: Secondary | ICD-10-CM | POA: Insufficient documentation

## 2020-09-28 DIAGNOSIS — T781XXA Other adverse food reactions, not elsewhere classified, initial encounter: Secondary | ICD-10-CM

## 2020-09-28 DIAGNOSIS — Z853 Personal history of malignant neoplasm of breast: Secondary | ICD-10-CM | POA: Insufficient documentation

## 2020-09-28 DIAGNOSIS — T782XXA Anaphylactic shock, unspecified, initial encounter: Secondary | ICD-10-CM | POA: Insufficient documentation

## 2020-09-28 DIAGNOSIS — Z79899 Other long term (current) drug therapy: Secondary | ICD-10-CM | POA: Insufficient documentation

## 2020-09-28 DIAGNOSIS — Z1212 Encounter for screening for malignant neoplasm of rectum: Secondary | ICD-10-CM | POA: Diagnosis not present

## 2020-09-28 DIAGNOSIS — Z85828 Personal history of other malignant neoplasm of skin: Secondary | ICD-10-CM | POA: Diagnosis not present

## 2020-09-28 DIAGNOSIS — Z923 Personal history of irradiation: Secondary | ICD-10-CM | POA: Insufficient documentation

## 2020-09-28 DIAGNOSIS — R0902 Hypoxemia: Secondary | ICD-10-CM | POA: Diagnosis not present

## 2020-09-28 DIAGNOSIS — X58XXXA Exposure to other specified factors, initial encounter: Secondary | ICD-10-CM | POA: Diagnosis not present

## 2020-09-28 DIAGNOSIS — Z1211 Encounter for screening for malignant neoplasm of colon: Secondary | ICD-10-CM | POA: Diagnosis not present

## 2020-09-28 DIAGNOSIS — R1310 Dysphagia, unspecified: Secondary | ICD-10-CM | POA: Diagnosis not present

## 2020-09-28 DIAGNOSIS — R4781 Slurred speech: Secondary | ICD-10-CM | POA: Diagnosis not present

## 2020-09-28 MED ORDER — FAMOTIDINE IN NACL 20-0.9 MG/50ML-% IV SOLN
20.0000 mg | Freq: Once | INTRAVENOUS | Status: AC
  Administered 2020-09-28: 20 mg via INTRAVENOUS
  Filled 2020-09-28: qty 50

## 2020-09-28 MED ORDER — DIPHENHYDRAMINE HCL 50 MG/ML IJ SOLN
12.5000 mg | Freq: Once | INTRAMUSCULAR | Status: AC
  Administered 2020-09-28: 12.5 mg via INTRAVENOUS
  Filled 2020-09-28: qty 1

## 2020-09-28 MED ORDER — EPINEPHRINE 0.3 MG/0.3ML IJ SOAJ
0.3000 mg | Freq: Once | INTRAMUSCULAR | Status: AC
  Administered 2020-09-28: 0.3 mg via INTRAMUSCULAR
  Filled 2020-09-28: qty 0.3

## 2020-09-28 NOTE — ED Provider Notes (Signed)
[facility] DEPT Provider Note   CSN: [id number] Arrival date & time: 09/28/20  2213     History Chief Complaint  Patient presents with   Allergic Reaction    Michele [name] is a 80 y.o. female presents to the emergency department with complaints of allergic reaction onset around 8:30 PM.  Patient reports a history of alpha-gal.  Patient reports eating ham around 3 PM.  She reports onset of her symptoms is usually into the 3 to 4-hour range but she has been able to eat ham and other pork products without difficulty in the past.  Reports she started with itching of her feet which spread within 10 to 15 minutes to her entire body.  She developed associated nausea, difficulty swallowing, swelling of her throat and change in voice.  Patient called EMS and was given Solu-Medrol IV.  Reports she is feeling some better.  Denies vomiting, lightheadedness, syncope.  Patient reports when this started she took 2 tablets of Benadryl but that did not resolve the reaction.  The history is provided by the patient and medical records. No language interpreter was used.      Past Medical History:  Diagnosis Date   Allergic rhinitis    Allergy to alpha-gal    Anal fissure    Anxiety    Asthma    only as a child; went away by age [age]   Breast cancer ([place])    Calcified granuloma of lung ([place])    Cancer of the skin, basal cell    Coronary artery disease    Depression    GERD (gastroesophageal reflux disease)    Hyperglycemia    Hyperlipidemia    IBS (irritable bowel syndrome)    Nicotine dependence    OA (osteoarthritis) of knee    Osteopenia    Personal history of radiation therapy    Seasonal affective disorder ([facility])    Seasonal allergies    Status post dilation of esophageal narrowing     Patient Active Problem List   Diagnosis Date Noted   Statin myopathy 10/09/2019   Hyperlipidemia 10/06/2019   Elevated coronary artery calcium score 10/06/2019   Ductal  carcinoma in situ (DCIS) of left breast 09/30/2017   Alpha gal hypersensitivity 11/22/2015    Past Surgical History:  Procedure Laterality Date   BREAST BIOPSY Left 08/2017   BREAST LUMPECTOMY Left 10/2017   BREAST LUMPECTOMY WITH NEEDLE LOCALIZATION Left 11/08/2017   Procedure: LEFT BREAST SUBAREOLAR BIOPSY WITH NEEDLE LOCALIZATION;  Surgeon: [name], MD;  Location: [place];  Service: General;  Laterality: Left;   BREAST LUMPECTOMY WITH RADIOACTIVE SEED LOCALIZATION Left 11/08/2017   Procedure: LEFT BREAST LUMPECTOMY WITH RADIOACTIVE SEED LOCALIZATION X 2;  Surgeon: [name], MD;  Location: [facility];  Service: General;  Laterality: Left;   BROW LIFT     CATARACT EXTRACTION Bilateral    COLONOSCOPY  2010   Incomplete PE negative redundant colon   TONSILLECTOMY     age 1     OB History   No obstetric history on file.     Family History  Problem Relation Age of Onset   Asthma Mother    COPD Mother    Cancer Sister        type unknown   Heart attack Father    Breast cancer Sister        half sister   COPD Sister    Thyroid disease Daughter  Social History   Tobacco Use   Smoking status: Former    Types: Cigarettes    Start date: 07/18/1956    Quit date: 07/19/1982    Years since quitting: 38.2   Smokeless tobacco: Never  Vaping Use   Vaping Use: Never used  Substance Use Topics   Alcohol use: Yes    Alcohol/week: 2.0 standard drinks    Types: 1 Glasses of wine, 1 Shots of liquor per week    Comment: 2 in the eveing before dinner   Drug use: No    Home Medications Prior to Admission medications   Medication Sig Start Date End Date Taking? Authorizing Provider  cetirizine (ZYRTEC ALLERGY) 10 MG tablet Take 1 tablet (10 mg total) by mouth 2 (two) times daily. 09/29/20  Yes Brandley Aldrete, Jarrett Soho, PA-C  EPINEPHrine (EPIPEN 2-PAK) 0.3 mg/0.3 mL IJ SOAJ injection Inject 0.3 mg into the muscle once as needed (for severe  allergic reaction). CAll 911 immediately if you have to use this medicine 09/29/20  Yes Reianna Batdorf, Jarrett Soho, PA-C  famotidine (PEPCID) 20 MG tablet Take 1 tablet (20 mg total) by mouth 2 (two) times daily. 09/29/20  Yes Adonus Uselman, Jarrett Soho, PA-C  Cholecalciferol (VITAMIN D3) 1000 units CAPS Take by mouth daily.    [provider]  Citalopram Hydrobromide (CELEXA PO) Take 10 mg by mouth as needed.    [provider]  Evolocumab (REPATHA SURECLICK) [id number] MG/ML SOAJ Inject 140 mg into the skin every 14 (fourteen) days. 12/08/19   Lorretta Harp, MD  fluticasone (VERAMYST) 27.5 MCG/SPRAY nasal spray Place 2 sprays into the nose as needed.    [provider]  levocetirizine (XYZAL) 5 MG tablet Take 5 mg by mouth as needed.     [provider]  polyethylene glycol powder (GLYCOLAX/MIRALAX) 17 GM/SCOOP powder Take 17 g by mouth as needed.    [provider]  triamcinolone ointment (KENALOG) 0.1 %  07/24/19   [provider]    Allergies    Azithromycin, Alpha-gal, Influenza vaccines, and Reglan [metoclopramide]  Review of Systems   Review of Systems  Constitutional:  Negative for appetite change, diaphoresis, fatigue, fever and unexpected weight change.  HENT:  Positive for facial swelling, sore throat, trouble swallowing and voice change. Negative for mouth sores.   Eyes:  Negative for visual disturbance.  Respiratory:  Positive for chest tightness. Negative for cough, shortness of breath and wheezing.   Cardiovascular:  Negative for chest pain.  Gastrointestinal:  Positive for nausea. Negative for abdominal pain, constipation, diarrhea and vomiting.  Endocrine: Negative for polydipsia, polyphagia and polyuria.  Genitourinary:  Negative for dysuria, frequency, hematuria and urgency.  Musculoskeletal:  Negative for back pain and neck stiffness.  Skin:  Positive for rash.  Allergic/Immunologic: Negative for immunocompromised state.  Neurological:   Negative for syncope, light-headedness and headaches.  Hematological:  Does not bruise/bleed easily.  Psychiatric/Behavioral:  Negative for sleep disturbance. The patient is not nervous/anxious.    Physical Exam Updated Vital Signs BP (!) 123/95 (BP Location: Left Arm)   Pulse 77   Temp 98.4 F (36.9 C) (Oral)   Resp 16   SpO2 97%   Physical Exam Vitals and nursing note reviewed.  Constitutional:      General: She is not in acute distress.    Appearance: She is not diaphoretic.  HENT:     Head: Normocephalic.     Comments: Hoarse voice Mild swelling of the posterior oropharynx and uvula.  Uvula is midline.  Eyes:     General: No scleral icterus.    Conjunctiva/sclera: Conjunctivae normal.  Cardiovascular:     Rate and Rhythm: Normal rate and regular rhythm.     Pulses: Normal pulses.          Radial pulses are 2+ on the right side and 2+ on the left side.  Pulmonary:     Effort: No tachypnea, accessory muscle usage, prolonged expiration, respiratory distress or retractions.     Breath sounds: No stridor.     Comments: Equal chest rise. No increased work of breathing. Abdominal:     General: There is no distension.     Palpations: Abdomen is soft.     Tenderness: There is no abdominal tenderness. There is no guarding or rebound.  Musculoskeletal:     Cervical back: Normal range of motion.     Comments: Moves all extremities equally and without difficulty. Swelling of the bilateral hands  Skin:    General: Skin is warm and dry.     Capillary Refill: Capillary refill takes less than 2 seconds.     Findings: Rash (urticaria over the entire body) present.  Neurological:     Mental Status: She is alert.     GCS: GCS eye subscore is 4. GCS verbal subscore is 5. GCS motor subscore is 6.     Comments: Speech is clear and goal oriented.  Psychiatric:        Mood and Affect: Mood normal.    ED Results / Procedures / Treatments     Procedures .Critical  Care  Date/Time: 09/29/2020 1:32 AM Performed by: Abigail Butts, PA-C Authorized by: Abigail Butts, PA-C   Critical care provider statement:    Critical care time (minutes):  45   Critical care time was exclusive of:  Separately billable procedures and treating other patients and teaching time   Critical care was necessary to treat or prevent imminent or life-threatening deterioration of the following conditions:  Circulatory failure   Critical care was time spent personally by me on the following activities:  Discussions with consultants, evaluation of patient's response to treatment, examination of patient, ordering and performing treatments and interventions, ordering and review of laboratory studies, ordering and review of radiographic studies, pulse oximetry, re-evaluation of patient's condition, obtaining history from patient or surrogate and review of old charts   I assumed direction of critical care for this patient from another provider in my specialty: no     Medications Ordered in ED Medications  famotidine (PEPCID) IVPB 20 mg premix (0 mg Intravenous Stopped 09/28/20 2354)  diphenhydrAMINE (BENADRYL) injection 12.5 mg (12.5 mg Intravenous Given 09/28/20 2258)  EPINEPHrine (EPI-PEN) injection 0.3 mg (0.3 mg Intramuscular Given 09/28/20 2258)    ED Course  I have reviewed the triage vital signs and the nursing notes.  Pertinent labs & imaging results that were available during my care of the patient were reviewed by me and considered in my medical decision making (see chart for details).    MDM Rules/Calculators/A&P                           Patient presents with anaphylaxis secondary to alpha gal and pork exposure.  Reports she is feeling much better however on exam still has a hoarse voice with obvious swelling of the oropharynx.  Will give additional Benadryl, Pepcid and EpiPen.  1:31 AM Patient with complete resolution of symptoms.  Appears well.  Reports that she  is ready to go home.  Patient is allergic to prednisone which causes rash and itching.  Will give Zyrtec, Pepcid and EpiPen.  Suggested patient avoid pork products in the future.  Discussed use of EpiPen and reasons to return to the emergency department.  Patient states understanding and is in agreement with the plan.  BP 136/67   Pulse 81   Temp 98.4 F (36.9 C) (Oral)   Resp 19   SpO2 96%   Final Clinical Impression(s) / ED Diagnoses Final diagnoses:  Anaphylaxis, initial encounter  Allergic reaction to alpha-gal    Rx / DC Orders ED Discharge Orders          Ordered    famotidine (PEPCID) 20 MG tablet  2 times daily        09/29/20 0130    cetirizine (ZYRTEC ALLERGY) 10 MG tablet  2 times daily        09/29/20 0130    EPINEPHrine (EPIPEN 2-PAK) 0.3 mg/0.3 mL IJ SOAJ injection  Once PRN        09/29/20 0130             Vicktoria Muckey, Gwenlyn Perking 09/29/20 0132    Carmin Muskrat, MD 09/30/20 1124

## 2020-09-28 NOTE — ED Triage Notes (Signed)
Patient presents from home complaints of anaphylaxis. Today she ate a ham sandwich and had a reaction at 2030 tonight. Reaction consisted of hives, itching, upper airway swelling and shortness of breath. Her Epipen expired, so she was only able to take 50 of Benadryl at home. By the time EMS arrived there was some improvement in symptoms. She is still hoarse and hive are present though less. EMS administered 125 mg of solumedrol.    HX: Alpha gal  EMS vitals: 160/90 BP 88 HR 95% O2 sat on room air 18 Resp Rate 124 CBG

## 2020-09-29 MED ORDER — CETIRIZINE HCL 10 MG PO TABS: 10.0000 mg | ORAL_TABLET | Freq: Two times a day (BID) | ORAL | 0 refills | Status: DC

## 2020-09-29 MED ORDER — EPINEPHRINE 0.3 MG/0.3ML IJ SOAJ: 0.3000 mg | Freq: Once | INTRAMUSCULAR | 0 refills | Status: AC | PRN

## 2020-09-29 MED ORDER — FAMOTIDINE 20 MG PO TABS: 20.0000 mg | ORAL_TABLET | Freq: Two times a day (BID) | ORAL | 0 refills | Status: DC

## 2020-09-29 NOTE — Discharge Instructions (Addendum)
1. Medications: Zyrtec, Pepcid, usual home medications 2. Treatment: rest, drink plenty of fluids, take medications as prescribed 3. Follow Up: Please followup with your primary doctor in 3 days for discussion of your diagnoses and further evaluation after today's visit; if you do not have a primary care doctor use the resource guide provided to find one; followup with dermatology as needed; Return to the ER for difficulty breathing, return of allergic reaction or other concerning symptoms

## 2020-10-05 DIAGNOSIS — T782XXD Anaphylactic shock, unspecified, subsequent encounter: Secondary | ICD-10-CM | POA: Diagnosis not present

## 2020-10-05 DIAGNOSIS — Z09 Encounter for follow-up examination after completed treatment for conditions other than malignant neoplasm: Secondary | ICD-10-CM | POA: Diagnosis not present

## 2020-10-05 DIAGNOSIS — R5383 Other fatigue: Secondary | ICD-10-CM | POA: Diagnosis not present

## 2020-10-05 LAB — COLOGUARD: COLOGUARD: NEGATIVE

## 2020-10-10 ENCOUNTER — Telehealth: Payer: Self-pay | Admitting: Cardiovascular Disease

## 2020-10-10 DIAGNOSIS — H938X1 Other specified disorders of right ear: Secondary | ICD-10-CM | POA: Diagnosis not present

## 2020-10-10 DIAGNOSIS — H6121 Impacted cerumen, right ear: Secondary | ICD-10-CM | POA: Diagnosis not present

## 2020-10-10 MED ORDER — REPATHA SURECLICK 140 MG/ML ~~LOC~~ SOAJ: 140.0000 mg | SUBCUTANEOUS | 3 refills | Status: DC

## 2020-10-10 MED ORDER — REPATHA SURECLICK 140 MG/ML ~~LOC~~ SOAJ: 140.0000 mg | SUBCUTANEOUS | 11 refills | Status: DC

## 2020-10-10 NOTE — Telephone Encounter (Signed)
*  STAT* If patient is at the pharmacy, call can be transferred to refill team.   1. Which medications need to be refilled? (please list name of each medication and dose if known) Evolocumab (REPATHA SURECLICK) [id number] MG/ML SOAJ  2. Which pharmacy/location (including street and city if local pharmacy) is medication to be sent to? [facility], [address]  3. Do they need a 30 day or 90 day supply? [place]

## 2020-10-10 NOTE — Telephone Encounter (Signed)
REFILL SENT FOR A 90 DAY SUPPLY AS REQUESTED

## 2020-10-19 DIAGNOSIS — M810 Age-related osteoporosis without current pathological fracture: Secondary | ICD-10-CM | POA: Diagnosis not present

## 2020-10-20 ENCOUNTER — Other Ambulatory Visit: Payer: Self-pay

## 2020-10-20 ENCOUNTER — Ambulatory Visit
Admission: RE | Admit: 2020-10-20 | Discharge: 2020-10-20 | Disposition: A | Discharge status: Home / Self Care | Payer: Medicare PPO | Source: Ambulatory Visit | Attending: Hematology and Oncology | Admitting: Hematology and Oncology

## 2020-10-20 DIAGNOSIS — Z1231 Encounter for screening mammogram for malignant neoplasm of breast: Secondary | ICD-10-CM

## 2020-10-26 ENCOUNTER — Other Ambulatory Visit: Payer: Self-pay | Admitting: Hematology and Oncology

## 2020-10-26 DIAGNOSIS — R928 Other abnormal and inconclusive findings on diagnostic imaging of breast: Secondary | ICD-10-CM

## 2020-10-27 HISTORY — PX: BREAST LUMPECTOMY: SHX2

## 2020-11-10 ENCOUNTER — Other Ambulatory Visit: Payer: Medicare PPO

## 2020-11-10 ENCOUNTER — Inpatient Hospital Stay: Admission: RE | Admit: 2020-11-10 | Payer: Medicare PPO | Source: Ambulatory Visit

## 2020-11-14 ENCOUNTER — Other Ambulatory Visit: Payer: Self-pay

## 2020-11-14 ENCOUNTER — Ambulatory Visit
Admission: RE | Admit: 2020-11-14 | Discharge: 2020-11-14 | Disposition: A | Discharge status: Home / Self Care | Payer: Medicare PPO | Source: Ambulatory Visit | Attending: Hematology and Oncology | Admitting: Hematology and Oncology

## 2020-11-14 ENCOUNTER — Other Ambulatory Visit: Payer: Self-pay | Admitting: Hematology and Oncology

## 2020-11-14 DIAGNOSIS — R928 Other abnormal and inconclusive findings on diagnostic imaging of breast: Secondary | ICD-10-CM

## 2020-11-14 DIAGNOSIS — R922 Inconclusive mammogram: Secondary | ICD-10-CM | POA: Diagnosis not present

## 2020-11-17 ENCOUNTER — Ambulatory Visit
Admission: RE | Admit: 2020-11-17 | Discharge: 2020-11-17 | Disposition: A | Discharge status: Home / Self Care | Payer: Medicare PPO | Source: Ambulatory Visit | Attending: Hematology and Oncology | Admitting: Hematology and Oncology

## 2020-11-17 ENCOUNTER — Other Ambulatory Visit: Payer: Self-pay | Admitting: Hematology and Oncology

## 2020-11-17 ENCOUNTER — Other Ambulatory Visit: Payer: Self-pay

## 2020-11-17 DIAGNOSIS — R928 Other abnormal and inconclusive findings on diagnostic imaging of breast: Secondary | ICD-10-CM

## 2020-11-17 DIAGNOSIS — N6311 Unspecified lump in the right breast, upper outer quadrant: Secondary | ICD-10-CM | POA: Diagnosis not present

## 2020-11-17 DIAGNOSIS — C50811 Malignant neoplasm of overlapping sites of right female breast: Secondary | ICD-10-CM | POA: Diagnosis not present

## 2020-11-22 ENCOUNTER — Other Ambulatory Visit: Payer: Medicare PPO

## 2020-11-23 DIAGNOSIS — H40013 Open angle with borderline findings, low risk, bilateral: Secondary | ICD-10-CM | POA: Diagnosis not present

## 2020-11-25 ENCOUNTER — Ambulatory Visit: Payer: Self-pay | Admitting: Surgery

## 2020-11-25 DIAGNOSIS — C50411 Malignant neoplasm of upper-outer quadrant of right female breast: Secondary | ICD-10-CM | POA: Diagnosis not present

## 2020-11-25 DIAGNOSIS — C50911 Malignant neoplasm of unspecified site of right female breast: Secondary | ICD-10-CM

## 2020-11-29 ENCOUNTER — Other Ambulatory Visit: Payer: Self-pay | Admitting: Surgery

## 2020-11-29 DIAGNOSIS — C50911 Malignant neoplasm of unspecified site of right female breast: Secondary | ICD-10-CM

## 2020-12-01 ENCOUNTER — Telehealth: Payer: Self-pay | Admitting: Radiation Oncology

## 2020-12-08 ENCOUNTER — Ambulatory Visit
Admission: RE | Admit: 2020-12-08 | Discharge: 2020-12-08 | Disposition: A | Discharge status: Home / Self Care | Payer: Medicare PPO | Source: Ambulatory Visit | Attending: Radiation Oncology | Admitting: Radiation Oncology

## 2020-12-08 ENCOUNTER — Encounter: Payer: Self-pay | Admitting: Radiation Oncology

## 2020-12-08 ENCOUNTER — Other Ambulatory Visit: Payer: Self-pay

## 2020-12-08 VITALS — BP 121/58 | HR 92 | Temp 98.0°F | Resp 18 | Wt 162.0 lb

## 2020-12-08 DIAGNOSIS — K589 Irritable bowel syndrome without diarrhea: Secondary | ICD-10-CM | POA: Diagnosis not present

## 2020-12-08 DIAGNOSIS — N6489 Other specified disorders of breast: Secondary | ICD-10-CM | POA: Diagnosis not present

## 2020-12-08 DIAGNOSIS — Z170 Estrogen receptor positive status [ER+]: Secondary | ICD-10-CM

## 2020-12-08 DIAGNOSIS — D0512 Intraductal carcinoma in situ of left breast: Secondary | ICD-10-CM

## 2020-12-08 DIAGNOSIS — Z803 Family history of malignant neoplasm of breast: Secondary | ICD-10-CM | POA: Insufficient documentation

## 2020-12-08 DIAGNOSIS — Z79899 Other long term (current) drug therapy: Secondary | ICD-10-CM | POA: Diagnosis not present

## 2020-12-08 DIAGNOSIS — E785 Hyperlipidemia, unspecified: Secondary | ICD-10-CM | POA: Insufficient documentation

## 2020-12-08 DIAGNOSIS — C50811 Malignant neoplasm of overlapping sites of right female breast: Secondary | ICD-10-CM

## 2020-12-08 DIAGNOSIS — K219 Gastro-esophageal reflux disease without esophagitis: Secondary | ICD-10-CM | POA: Insufficient documentation

## 2020-12-08 DIAGNOSIS — Z171 Estrogen receptor negative status [ER-]: Secondary | ICD-10-CM | POA: Diagnosis not present

## 2020-12-08 DIAGNOSIS — Z87891 Personal history of nicotine dependence: Secondary | ICD-10-CM | POA: Insufficient documentation

## 2020-12-08 DIAGNOSIS — I2510 Atherosclerotic heart disease of native coronary artery without angina pectoris: Secondary | ICD-10-CM | POA: Insufficient documentation

## 2020-12-08 DIAGNOSIS — Z809 Family history of malignant neoplasm, unspecified: Secondary | ICD-10-CM | POA: Diagnosis not present

## 2020-12-08 DIAGNOSIS — C50411 Malignant neoplasm of upper-outer quadrant of right female breast: Secondary | ICD-10-CM | POA: Diagnosis not present

## 2020-12-08 NOTE — Progress Notes (Addendum)
Radiation Oncology         [phone]) [phone] ________________________________  Name: ELLA GOLOMB        MRN: [id number]  Date of Service: 12/08/2020 DOB: 1941/01/11  WU:[id number], Thayer Jew, MD  Erroll Luna, MD     REFERRING PHYSICIAN: Erroll Luna, MD   DIAGNOSIS: The encounter diagnosis was Ductal carcinoma in situ (DCIS) of left breast.   HISTORY OF PRESENT ILLNESS: TERISSA HAFFEY is a 80 y.o. female with a  high grade., ER/PR negative DCIS with calcifications of the left breast in 2019. She subsequently underwent two lumpectomies on 11/08/17. The lateral specimen revealed DCIS with calcifications, high-grade spanning 9 mm, focally less than 1 mm to the medial margin, and of the subareolar specimen, DCIS with calcifications was also noted spanning 1.2 cm and was also high-grade.  The DCIS was focally less than 1 mm to the superior margin and focally 1 mm to the anterior margin.  She received adjuvant radiotherapy to the left breast which she completed in November 2019.   Recent mammogram in August 2022 showed concerns for a new finding in the right breast.  Postsurgical changes were seen in the left, diagnostic imaging measured a 5 mm abnormality in the right breast at 9:00, and biopsy on 11/17/2020 showed a grade 1-2 invasive ductal carcinoma that was ER/PR positive, HER2 was negative with a Ki-67 of 5%.  She has met with Dr. Brantley Stage since Dr. Dalbert Batman who had performed her last surgery has since retired.  They discussed the options of lumpectomy without sentinel lymph node sampling and she is scheduled for just on 01/03/2021.  She is seen to discuss the rationale of adjuvant radiotherapy.  PREVIOUS RADIATION THERAPY:   12/17/17 - 01/13/18 The patient initially received a dose of 42.56 Gy in 16 fractions to the breast using whole-breast tangent fields. This was delivered using a 3-D conformal technique. The patient then received a boost to the seroma. This delivered an additional 8 Gy in 4  fractions using a 3 field photon technique due to the depth of the seroma. The total dose was 50.56 Gy.     PAST MEDICAL HISTORY:  Past Medical History:  Diagnosis Date   Allergic rhinitis    Allergy to alpha-gal    Anal fissure    Anxiety    Asthma    only as a child; went away by age [age]   Breast cancer ([place])    Calcified granuloma of lung ([place])    Cancer of the skin, basal cell    Coronary artery disease    Depression    GERD (gastroesophageal reflux disease)    Hyperglycemia    Hyperlipidemia    IBS (irritable bowel syndrome)    Nicotine dependence    OA (osteoarthritis) of knee    Osteopenia    Personal history of radiation therapy    Seasonal affective disorder (HCC)    Seasonal allergies    Status post dilation of esophageal narrowing        PAST SURGICAL HISTORY: Past Surgical History:  Procedure Laterality Date   BREAST BIOPSY Left 08/2017   BREAST LUMPECTOMY Left 10/2017   BREAST LUMPECTOMY WITH NEEDLE LOCALIZATION Left 11/08/2017   Procedure: LEFT BREAST SUBAREOLAR BIOPSY WITH NEEDLE LOCALIZATION;  Surgeon: [name], MD;  Location: [facility];  Service: General;  Laterality: Left;   BREAST LUMPECTOMY WITH RADIOACTIVE SEED LOCALIZATION Left 11/08/2017   Procedure: LEFT BREAST LUMPECTOMY WITH RADIOACTIVE SEED LOCALIZATION X 2;  Surgeon: Dalbert Batman,  Renelda Loma, MD;  Location: [facility];  Service: General;  Laterality: Left;   BROW LIFT     CATARACT EXTRACTION Bilateral    COLONOSCOPY  2010   Incomplete PE negative redundant colon   TONSILLECTOMY     age 65     FAMILY HISTORY:  Family History  Problem Relation Age of Onset   Asthma Mother    COPD Mother    Cancer Sister        type unknown   Heart attack Father    Breast cancer Sister        half sister   COPD Sister    Thyroid disease Daughter      SOCIAL HISTORY:  reports that she quit smoking about 38 years ago. Her smoking use included cigarettes. She started  smoking about 64 years ago. She has never used smokeless tobacco. She reports current alcohol use of about 2.0 standard drinks per week. She reports that she does not use drugs. The patient is married and lives in [place]. She is a Licensed conveyancer.  She also is actively involved in a local university as well.   ALLERGIES: Azithromycin, Alpha-gal, Influenza vaccines, and Reglan [metoclopramide]   MEDICATIONS:  Current Outpatient Medications  Medication Sig Dispense Refill   cetirizine (ZYRTEC ALLERGY) 10 MG tablet Take 1 tablet (10 mg total) by mouth 2 (two) times daily. 10 tablet 0   Cholecalciferol (VITAMIN D3) 1000 units CAPS Take by mouth daily.     Citalopram Hydrobromide (CELEXA PO) Take 10 mg by mouth as needed.     EPINEPHrine (EPIPEN 2-PAK) 0.3 mg/0.3 mL IJ SOAJ injection Inject 0.3 mg into the muscle once as needed (for severe allergic reaction). CAll 911 immediately if you have to use this medicine 2 each 0   Evolocumab (REPATHA SURECLICK) 161 MG/ML SOAJ Inject 140 mg into the skin every 14 (fourteen) days. 6 mL 3   famotidine (PEPCID) 20 MG tablet Take 1 tablet (20 mg total) by mouth 2 (two) times daily. 10 tablet 0   fluticasone (VERAMYST) 27.5 MCG/SPRAY nasal spray Place 2 sprays into the nose as needed.     levocetirizine (XYZAL) 5 MG tablet Take 5 mg by mouth as needed.      polyethylene glycol powder (GLYCOLAX/MIRALAX) 17 GM/SCOOP powder Take 17 g by mouth as needed.     triamcinolone ointment (KENALOG) 0.1 %      No current facility-administered medications for this encounter.     REVIEW OF SYSTEMS: On review of systems, the patient reports that she is doing very well. She is not having any concerns of pain since her biopsy. She did have more pain in her left breast for up to a week after her mammogram. No other complaints are verbalized.      PHYSICAL EXAM:  Wt Readings from Last 3 Encounters:  03/23/20 164 lb 9.6 oz (74.7 kg)  02/15/20 165 lb (74.8 kg)  01/06/20  163 lb 6.4 oz (74.1 kg)   Temp Readings from Last 3 Encounters:  09/29/20 98.7 F (37.1 C) (Oral)  03/23/20 (!) 97.2 F (36.2 C) (Temporal)  03/23/19 98.3 F (36.8 C) (Temporal)   BP Readings from Last 3 Encounters:  09/29/20 118/67  03/23/20 132/64  02/15/20 140/70   Pulse Readings from Last 3 Encounters:  09/29/20 78  03/23/20 91  02/15/20 84     In general this is a well appearing Caucasian female in no acute distress.  She's alert and oriented x4  and appropriate throughout the examination. Cardiopulmonary assessment is negative for acute distress and she exhibits normal effort. Bilateral breast exam is deferred.    ECOG = 0  0 - Asymptomatic (Fully active, able to carry on all predisease activities without restriction)  1 - Symptomatic but completely ambulatory (Restricted in physically strenuous activity but ambulatory and able to carry out work of a light or sedentary nature. For example, light housework, office work)  2 - Symptomatic, <50% in bed during the day (Ambulatory and capable of all self care but unable to carry out any work activities. Up and about more than 50% of waking hours)  3 - Symptomatic, >50% in bed, but not bedbound (Capable of only limited self-care, confined to bed or chair 50% or more of waking hours)  4 - Bedbound (Completely disabled. Cannot carry on any self-care. Totally confined to bed or chair)  5 - Death   Eustace Pen MM, Creech RH, Tormey DC, et al. [phone]). "Toxicity and response criteria of the [facility] Group". [place] Oncol. 5 (6): 649-55    LABORATORY DATA:  Lab Results  Component Value Date   WBC 5.9 10/09/2017   HGB 14.3 10/09/2017   HCT 43.4 10/09/2017   MCV 94.9 10/09/2017   PLT 259 10/09/2017   Lab Results  Component Value Date   NA 141 10/09/2017   K 4.7 10/09/2017   CL 102 10/09/2017   CO2 28 10/09/2017   Lab Results  Component Value Date   ALT 10 10/09/2017   AST 16 10/09/2017   ALKPHOS  55 10/09/2017   BILITOT 0.6 10/09/2017      RADIOGRAPHY: US BREAST LTD UNI RIGHT INC AXILLA  Result Date: 11/14/2020 CLINICAL DATA:  The patient was called back for a right breast mass. EXAM: DIGITAL DIAGNOSTIC UNILATERAL RIGHT MAMMOGRAM WITH TOMOSYNTHESIS AND CAD; ULTRASOUND RIGHT BREAST LIMITED TECHNIQUE: Right digital diagnostic mammography and breast tomosynthesis was performed. The images were evaluated with computer-aided detection.; Targeted ultrasound examination of the right breast was performed COMPARISON:  Previous exam(s). ACR Breast Density Category b: There are scattered areas of fibroglandular density. FINDINGS: The irregular right breast mass at 9 o'clock in the right breast persists on today's imaging. On physical exam, no suspicious lumps are identified. Targeted ultrasound is performed, showing an irregular right breast mass at 9 o'clock, 7 cm from the nipple measuring 5 x 4 x 5 mm. No axillary adenopathy. IMPRESSION: Suspicious right breast mass at 9 o'clock. RECOMMENDATION: Ultrasound-guided biopsy of the right breast mass. I have discussed the findings and recommendations with the patient. If applicable, a reminder letter will be sent to the patient regarding the next appointment. BI-RADS CATEGORY  4: Suspicious. Electronically Signed   By: Dorise Bullion III M.D.   On: 11/14/2020 15:40  MM DIAG BREAST TOMO UNI RIGHT  Result Date: 11/14/2020 CLINICAL DATA:  The patient was called back for a right breast mass. EXAM: DIGITAL DIAGNOSTIC UNILATERAL RIGHT MAMMOGRAM WITH TOMOSYNTHESIS AND CAD; ULTRASOUND RIGHT BREAST LIMITED TECHNIQUE: Right digital diagnostic mammography and breast tomosynthesis was performed. The images were evaluated with computer-aided detection.; Targeted ultrasound examination of the right breast was performed COMPARISON:  Previous exam(s). ACR Breast Density Category b: There are scattered areas of fibroglandular density. FINDINGS: The irregular right breast mass  at 9 o'clock in the right breast persists on today's imaging. On physical exam, no suspicious lumps are identified. Targeted ultrasound is performed, showing an irregular right breast mass at 9 o'clock, 7 cm from  the nipple measuring 5 x 4 x 5 mm. No axillary adenopathy. IMPRESSION: Suspicious right breast mass at 9 o'clock. RECOMMENDATION: Ultrasound-guided biopsy of the right breast mass. I have discussed the findings and recommendations with the patient. If applicable, a reminder letter will be sent to the patient regarding the next appointment. BI-RADS CATEGORY  4: Suspicious. Electronically Signed   By: Dorise Bullion III M.D.   On: 11/14/2020 15:40  MM CLIP PLACEMENT RIGHT  Result Date: 11/17/2020 CLINICAL DATA:  Post procedure mammogram for clip placement. EXAM: 3D DIAGNOSTIC RIGHT MAMMOGRAM POST ULTRASOUND BIOPSY COMPARISON:  Previous exam(s). FINDINGS: 3D Mammographic images were obtained following ultrasound guided biopsy of a mass in the right breast at 9 o'clock. The biopsy marking clip is in expected position at the site of biopsy. IMPRESSION: Appropriate positioning of the ribbon shaped biopsy marking clip at the site of biopsy in the right breast at 9 o'clock. Final Assessment: Post Procedure Mammograms for Marker Placement Electronically Signed   By: Audie Pinto M.D.   On: 11/17/2020 15:13  [place] RT BREAST BX W LOC DEV 1ST LESION IMG BX SPEC US GUIDE  Addendum Date: 11/18/2020   ADDENDUM REPORT: 11/18/2020 13:45 ADDENDUM: PATHOLOGY revealed: Breast, right, needle core biopsy, right breast 9:00- INVASIVE DUCTAL CARCINOMA, GRADE 1/2. Microscopic Comment: The greatest tumor dimension is 0.6 cm. A breast prognostic profile will be performed. Pathology results are CONCORDANT with imaging findings, per Dr. Audie Pinto. Pathology results and recommendations were discussed with patient via telephone on 11/18/2020. Patient reported doing well after the biopsy with no adverse symptoms, and only  slight tenderness at the site. Post biopsy care instructions were reviewed, questions were answered and my direct phone number was provided. Patient was instructed to call [facility] for any additional questions or concerns related to biopsy site. RECOMMENDATION: Surgical consultation. Catalina Lunger (scheduler for [facility]) spoke with patient's husband (patient unavailable) to arrange surgical consultation appointment on 11/18/2020; husband will have patient return phone call to [place] today. Pathology results reported by Electa Sniff RN on 11/18/2020. Electronically Signed   By: Audie Pinto M.D.   On: 11/18/2020 13:45   Result Date: 11/18/2020 CLINICAL DATA:  80 year old female presenting for biopsy of a right breast mass. EXAM: ULTRASOUND GUIDED RIGHT BREAST CORE NEEDLE BIOPSY COMPARISON:  Previous exam(s). PROCEDURE: I met with the patient and we discussed the procedure of ultrasound-guided biopsy, including benefits and alternatives. We discussed the high likelihood of a successful procedure. We discussed the risks of the procedure, including infection, bleeding, tissue injury, clip migration, and inadequate sampling. Informed written consent was given. The usual time-out protocol was performed immediately prior to the procedure. Lesion quadrant: Upper outer quadrant Using sterile technique and 1% Lidocaine as local anesthetic, under direct ultrasound visualization, a 14 gauge spring-loaded device was used to perform biopsy of a mass in the right breast at 9 o'clock using a inferior approach. At the conclusion of the procedure a ribbon tissue marker clip was deployed into the biopsy cavity. Follow up 2 view mammogram was performed and dictated separately. IMPRESSION: Ultrasound guided biopsy of a mass in the right breast at 9 o'clock. No apparent complications. Electronically Signed: By: Audie Pinto M.D. On: 11/17/2020 15:12       IMPRESSION/PLAN: 1. Stage IA,  [id number], grade 2 ER/PR positive invasive ductal carcinoma of the right breast. Dr. Lisbeth Renshaw discusses the pathology findings and reviews the nature of early stage right breast disease. She is going to  proceed with  lumpectomy. Dr. Lisbeth Renshaw discusses the consideration of external radiotherapy to the breast  to reduce risks of local recurrence followed by antiestrogen therapy. We also discussed that for favorable cases we could consider forgoing radiation if her margins are clear. However she is still leaning toward being aggressive with her course of treatment and wants to make a final decision after surgery. We discussed the risks, benefits, short, and long term effects of radiotherapy, as well as the curative intent. Dr. Lisbeth Renshaw discusses the delivery and logistics of radiotherapy and anticipates a course of 4 weeks of radiotherapy to the right breast. We contact her by phone after surgery to discuss the simulation process and anticipate we starting radiotherapy about 4-6 weeks after surgery if she wishes to proceed.   2. High grade ER/PR negative DCIS of the left breast. She continues to be without disease since surgery and adjuvant radiotherapy. She will be followed closely in surveillance as well after treatment of #1. 3. Possible genetic predisposition to malignancy. The patient is a candidate for genetic testing given her personal  history. She was offered referral and is interested in initial conversation with genetics.   In a visit lasting 45 minutes, greater than 50% of the time was spent face to face discussing her case, and coordinating the patient's care.   The above documentation reflects my direct findings during this shared patient visit. Please see the separate note by Dr. Lisbeth Renshaw on this date for the remainder of the patient's plan of care.    Carola Rhine, PAC

## 2020-12-08 NOTE — Progress Notes (Signed)
Patient states doing well and reports no symptoms at this time.  Meaningful use complete.  BP (!) 121/58   Pulse 92   Temp 98 F (36.7 C)   Resp 18   Wt 162 lb (73.5 kg)   SpO2 98%   BMI 24.81 kg/m

## 2020-12-19 ENCOUNTER — Encounter: Payer: Self-pay | Admitting: Genetic Counselor

## 2020-12-19 ENCOUNTER — Other Ambulatory Visit: Payer: Self-pay

## 2020-12-19 ENCOUNTER — Inpatient Hospital Stay: Payer: Medicare PPO | Attending: Genetic Counselor | Admitting: Genetic Counselor

## 2020-12-19 ENCOUNTER — Inpatient Hospital Stay: Payer: Medicare PPO

## 2020-12-19 DIAGNOSIS — D0512 Intraductal carcinoma in situ of left breast: Secondary | ICD-10-CM

## 2020-12-19 DIAGNOSIS — C50811 Malignant neoplasm of overlapping sites of right female breast: Secondary | ICD-10-CM | POA: Diagnosis not present

## 2020-12-19 DIAGNOSIS — Z803 Family history of malignant neoplasm of breast: Secondary | ICD-10-CM | POA: Diagnosis not present

## 2020-12-19 DIAGNOSIS — Z170 Estrogen receptor positive status [ER+]: Secondary | ICD-10-CM

## 2020-12-19 HISTORY — DX: Family history of malignant neoplasm of breast: Z80.3

## 2020-12-19 LAB — GENETIC SCREENING ORDER

## 2020-12-19 NOTE — Progress Notes (Signed)
REFERRING PROVIDER: Ronny Bacon, PA-C [address] [place],  [place] [address]  PRIMARY PROVIDER:  Merri Brunette, MD  PRIMARY REASON FOR VISIT:  1. Malignant neoplasm of overlapping sites of right breast in female, estrogen receptor positive (HCC)   2. Ductal carcinoma in situ (DCIS) of left breast   3. Family history of breast cancer     HISTORY OF PRESENT ILLNESS:   Michele [name], a 80 y.o. female, was seen for a [place] cancer genetics consultation at the request of Laurence Aly, PA-C due to a personal and family history of cancer.  Michele [name] presents to clinic today to discuss the possibility of a hereditary predisposition to cancer, to discuss genetic testing, and to further clarify her future cancer risks, as well as potential cancer risks for family members.   In 2019, at the age of 66, Michele [name] was diagnosed with ductal carcinoma in situ of the left breast (ER-/PR-). The treatment plan included lumpectomy and adjuvant radiation.  In 2022, at the age of 13, Michele [name] was diagnosed with invasive ductal carcinoma of the right breast (ER+/PR+/HER2-).  The treatment plan is pending. She also has a history of basal cell carcinoma on her nose (approximately 3 and 5 years ago).     CANCER HISTORY:  Oncology History  Ductal carcinoma in situ (DCIS) of left breast  09/30/2017 Initial Diagnosis   Screening detected left breast calcifications 2 groups upper outer quadrant 1.5 cm and 1.2 cm both of which are biopsy-proven high-grade DCIS with calcifications ER PR negative.  Underneath the nipple 2 groups of calcifications.  Posterior group was biopsy-proven fibrocystic change anterior group could not be biopsied.  Tis NX stage 0   11/08/2017 Surgery   2 left lumpectomies: DCIS high-grade 0.9 cm focally less than 0.1 cm to medial margin; second lumpectomy left subareolar: DCIS with calcifications high-grade 1.2 cm focally less than 0.1 cm superior margin and focally,  0.1 cm to anterior  margin ER 0%, PR 0% for both Tis NX stage 0   12/18/2017 - 01/13/2018 Radiation Therapy   Adjuvant radiation therapy     RISK FACTORS:  Menarche was at age 77.  First live birth at age 51.  OCP use for approximately 0 years. Ovaries intact: yes.  Hysterectomy: no.  Menopausal status: postmenopausal.  HRT use: 3 years; stopped more than 10 years ago Colonoscopy: most recent in 2010; normal Cologuard this year Mammogram within the last year: yes. Any excessive radiation exposure in the past: yes  Past Medical History:  Diagnosis Date   Allergic rhinitis    Allergy to alpha-gal    Anal fissure    Anxiety    Asthma    only as a child; went away by age 26/19   Breast cancer (HCC)    Calcified granuloma of lung (HCC)    Cancer of the skin, basal cell    Coronary artery disease    Depression    Family history of breast cancer 12/19/2020   GERD (gastroesophageal reflux disease)    Hyperglycemia    Hyperlipidemia    IBS (irritable bowel syndrome)    Nicotine dependence    OA (osteoarthritis) of knee    Osteopenia    Personal history of radiation therapy    Seasonal affective disorder (HCC)    Seasonal allergies    Status post dilation of esophageal narrowing     Past Surgical History:  Procedure Laterality Date   BREAST BIOPSY Left 08/2017  BREAST LUMPECTOMY Left 10/2017   BREAST LUMPECTOMY WITH NEEDLE LOCALIZATION Left 11/08/2017   Procedure: LEFT BREAST SUBAREOLAR BIOPSY WITH NEEDLE LOCALIZATION;  Surgeon: [name], MD;  Location: [place];  Service: General;  Laterality: Left;   BREAST LUMPECTOMY WITH RADIOACTIVE SEED LOCALIZATION Left 11/08/2017   Procedure: LEFT BREAST LUMPECTOMY WITH RADIOACTIVE SEED LOCALIZATION X 2;  Surgeon: [name], MD;  Location: [place] [facility];  Service: General;  Laterality: Left;   BROW LIFT     CATARACT EXTRACTION Bilateral    COLONOSCOPY  2010   Incomplete PE negative redundant colon    TONSILLECTOMY     age 29    Social History   Socioeconomic History   Marital status: Married    Spouse name: Not on file   Number of children: 2   Years of education: Not on file   Highest education level: Not on file  Occupational History   Occupation: retired Music therapist  at [employer]   Smoking status: Former    Types: Cigarettes    Start date: 07/18/1956    Quit date: 07/19/1982    Years since quitting: 38.4   Smokeless tobacco: Never  Vaping Use   Vaping Use: Never used  Substance and Sexual Activity   Alcohol use: Yes    Alcohol/week: 2.0 standard drinks    Types: 1 Glasses of wine, 1 Shots of liquor per week    Comment: 2 in the eveing before dinner   Drug use: No   Sexual activity: Never  Other Topics Concern   Not on file  Social History Narrative   Married 1 son 1 daughter   Retired Nurse, children's   2 alcoholic beverages daily former smoker no caffeine no drug use no tobacco now   Social Determinants of Radio broadcast assistant Strain: Not on file  Food Insecurity: Not on file  Transportation Needs: Not on file  Physical Activity: Not on file  Stress: Not on file  Social Connections: Not on file     FAMILY HISTORY:  We obtained a detailed, 4-generation family history.  Significant diagnoses are listed below: Family History  Problem Relation Age of Onset   Cancer Sister        maternal half sister; type unknown; thigh?   Breast cancer Sister        maternal half sister; two primaries; mid-late 33s   Breast cancer Niece        dx before 65    Michele [name]. [place] is unaware of previous family history of genetic testing for hereditary cancer risks. There is no reported Ashkenazi Jewish ancestry. There is no known consanguinity.  GENETIC COUNSELING ASSESSMENT: Michele [name] is a 80 y.o. female with a personal and family history of cancer which is somewhat suggestive of a hereditary cancer syndrome  and predisposition to cancer given her history of two breast cancer primaries and the presence of related cancers in the family. We, therefore, discussed and recommended the following at today's visit.   DISCUSSION: We discussed that 5 - 10% of cancer is hereditary, with most cases of hereditary breast cancer associated with mutations in BRCA1/2.  There are other genes that can be associated with hereditary breast cancer syndromes.  Type  of cancer risk and level of risk are gene-specific.  We discussed that testing is beneficial for several reasons including knowing how to follow individuals after completing their treatment and understanding if other family members could be at risk for cancer and allowing them to undergo genetic testing.   We reviewed the characteristics, features and inheritance patterns of hereditary cancer syndromes. We also discussed genetic testing, including the appropriate family members to test, the process of testing, insurance coverage and turn-around-time for results. We discussed the implications of a negative, positive, carrier and/or variant of uncertain significant result. We recommended Michele [name] pursue genetic testing for a panel that includes genes associated with breast cancer.   Michele [name]  was offered a common hereditary cancer panel (47 genes) and an expanded pan-cancer panel (77 genes). Michele [name] was informed of the benefits and limitations of each panel, including that expanded pan-cancer panels contain genes that do not have clear management guidelines at this point in time.  We also discussed that as the number of genes included on a panel increases, the chances of variants of uncertain significance increases.  After considering the benefits and limitations of each gene panel, Michele [name]  elected to have a common hereditary cancers panel through [place].  The CustomNext-Cancer+RNAinsight panel offered by [facility] includes sequencing and rearrangement analysis for  the following 47 genes:  APC, ATM, AXIN2, BARD1, BMPR1A, BRCA1, BRCA2, BRIP1, CDH1, CDK4, CDKN2A, CHEK2, DICER1, EPCAM, GREM1, HOXB13, MEN1, MLH1, MSH2, MSH3, MSH6, MUTYH, NBN, NF1, NF2, NTHL1, PALB2, PMS2, POLD1, POLE, PTEN, RAD51C, RAD51D, RECQL, RET, SDHA, SDHAF2, SDHB, SDHC, SDHD, SMAD4, SMARCA4, STK11, TP53, TSC1, TSC2, and VHL.  RNA data is routinely analyzed for use in variant interpretation for all genes.  Based on Michele [name]'s personal and family history of cancer, she meets medical criteria for genetic testing. Despite that she meets criteria, she may still have an out of pocket cost. We discussed that if her out of pocket cost for testing is over $100, the laboratory should contact her to discuss self pay options and/or patient pay assistance programs.   PLAN: After considering the risks, benefits, and limitations, Michele [name] provided informed consent to pursue genetic testing and the blood sample was sent to [facility] for analysis of the CustomNext-Cancer +RNAinsight Panel. Results should be available within approximately 3 weeks' time, at which point they will be disclosed by telephone to Michele [name], as will any additional recommendations warranted by these results. Michele [name] will receive a summary of her genetic counseling visit and a copy of her results once available. This information will also be available in Epic.    Lastly, we encouraged Michele [name] to remain in contact with cancer genetics annually so that we can continuously update the family history and inform her of any changes in cancer genetics and testing that may be of benefit for this family.   Michele [name] questions were answered to her satisfaction today. Our contact information was provided should additional questions or concerns arise. Thank you for the referral and allowing [place] to share in the care of your patient.   Jacquees Gongora M. Joette Catching, [place], [facility] Genetic Counselor [email or web address] (P) [phone]  The  patient was seen for a total of 30 minutes in face-to-face genetic counseling.  The patient was seen alone.  Drs. Magrinat, Lindi Adie and/or Burr Medico were available to discuss this case as needed.    _______________________________________________________________________ For Office Staff:  Number of people involved in session: 1 Was an Intern/ student involved  with case: no

## 2020-12-22 DIAGNOSIS — L821 Other seborrheic keratosis: Secondary | ICD-10-CM | POA: Diagnosis not present

## 2020-12-22 DIAGNOSIS — L578 Other skin changes due to chronic exposure to nonionizing radiation: Secondary | ICD-10-CM | POA: Diagnosis not present

## 2020-12-22 DIAGNOSIS — Z85828 Personal history of other malignant neoplasm of skin: Secondary | ICD-10-CM | POA: Diagnosis not present

## 2020-12-26 ENCOUNTER — Encounter (HOSPITAL_BASED_OUTPATIENT_CLINIC_OR_DEPARTMENT_OTHER): Payer: Self-pay | Admitting: Surgery

## 2021-01-02 ENCOUNTER — Ambulatory Visit
Admission: RE | Admit: 2021-01-02 | Discharge: 2021-01-02 | Disposition: A | Discharge status: Home / Self Care | Payer: Medicare PPO | Source: Ambulatory Visit | Attending: Surgery | Admitting: Surgery

## 2021-01-02 ENCOUNTER — Other Ambulatory Visit: Payer: Self-pay

## 2021-01-02 DIAGNOSIS — C50911 Malignant neoplasm of unspecified site of right female breast: Secondary | ICD-10-CM | POA: Diagnosis not present

## 2021-01-02 NOTE — Anesthesia Preprocedure Evaluation (Addendum)
Anesthesia Evaluation  Patient identified by MRN, date of birth, ID band Patient awake    Reviewed: Allergy & Precautions, NPO status , Patient's Chart, lab work & pertinent test results  Airway Mallampati: II  TM Distance: >3 FB Neck ROM: Full    Dental no notable dental hx. (+) Teeth Intact, Dental Advisory Given   Pulmonary asthma , former smoker,    Pulmonary exam normal breath sounds clear to auscultation       Cardiovascular Exercise Tolerance: Good + CAD  Normal cardiovascular exam Rhythm:Regular Rate:Normal     Neuro/Psych  Neuromuscular disease negative neurological ROS     GI/Hepatic negative GI ROS, Neg liver ROS,   Endo/Other  negative endocrine ROS  Renal/GU negative Renal ROS  negative genitourinary   Musculoskeletal negative musculoskeletal ROS (+)   Abdominal   Peds  Hematology   Anesthesia Other Findings R Breast CA  Reproductive/Obstetrics                            Anesthesia Physical Anesthesia Plan  ASA: 3  Anesthesia Plan: General   Post-op Pain Management:    Induction: Intravenous  PONV Risk Score and Plan: 4 or greater and Treatment may vary due to age or medical condition, Ondansetron and Dexamethasone  Airway Management Planned: LMA  Additional Equipment: None  Intra-op Plan:   Post-operative Plan:   Informed Consent:     Dental advisory given  Plan Discussed with:   Anesthesia Plan Comments:         Anesthesia Quick Evaluation

## 2021-01-03 ENCOUNTER — Encounter (HOSPITAL_BASED_OUTPATIENT_CLINIC_OR_DEPARTMENT_OTHER)
Admission: RE | Disposition: A | Discharge status: Home / Self Care | Payer: Self-pay | Source: Home / Self Care | Attending: Surgery

## 2021-01-03 ENCOUNTER — Ambulatory Visit (HOSPITAL_BASED_OUTPATIENT_CLINIC_OR_DEPARTMENT_OTHER): Payer: Medicare PPO | Admitting: Anesthesiology

## 2021-01-03 ENCOUNTER — Ambulatory Visit
Admission: RE | Admit: 2021-01-03 | Discharge: 2021-01-03 | Disposition: A | Discharge status: Home / Self Care | Payer: Medicare PPO | Source: Ambulatory Visit | Attending: Surgery | Admitting: Surgery

## 2021-01-03 ENCOUNTER — Encounter (HOSPITAL_BASED_OUTPATIENT_CLINIC_OR_DEPARTMENT_OTHER): Payer: Self-pay | Admitting: Surgery

## 2021-01-03 ENCOUNTER — Other Ambulatory Visit: Payer: Self-pay

## 2021-01-03 ENCOUNTER — Ambulatory Visit (HOSPITAL_BASED_OUTPATIENT_CLINIC_OR_DEPARTMENT_OTHER)
Admission: RE | Admit: 2021-01-03 | Discharge: 2021-01-03 | Disposition: A | Discharge status: Home / Self Care | Payer: Medicare PPO | Attending: Surgery | Admitting: Surgery

## 2021-01-03 DIAGNOSIS — C50411 Malignant neoplasm of upper-outer quadrant of right female breast: Secondary | ICD-10-CM | POA: Insufficient documentation

## 2021-01-03 DIAGNOSIS — J45909 Unspecified asthma, uncomplicated: Secondary | ICD-10-CM | POA: Insufficient documentation

## 2021-01-03 DIAGNOSIS — R928 Other abnormal and inconclusive findings on diagnostic imaging of breast: Secondary | ICD-10-CM | POA: Diagnosis not present

## 2021-01-03 DIAGNOSIS — C50911 Malignant neoplasm of unspecified site of right female breast: Secondary | ICD-10-CM

## 2021-01-03 DIAGNOSIS — Z87891 Personal history of nicotine dependence: Secondary | ICD-10-CM | POA: Insufficient documentation

## 2021-01-03 DIAGNOSIS — J302 Other seasonal allergic rhinitis: Secondary | ICD-10-CM | POA: Diagnosis not present

## 2021-01-03 DIAGNOSIS — Z170 Estrogen receptor positive status [ER+]: Secondary | ICD-10-CM | POA: Diagnosis not present

## 2021-01-03 DIAGNOSIS — I2510 Atherosclerotic heart disease of native coronary artery without angina pectoris: Secondary | ICD-10-CM | POA: Insufficient documentation

## 2021-01-03 DIAGNOSIS — G709 Myoneural disorder, unspecified: Secondary | ICD-10-CM | POA: Diagnosis not present

## 2021-01-03 DIAGNOSIS — N6011 Diffuse cystic mastopathy of right breast: Secondary | ICD-10-CM | POA: Diagnosis not present

## 2021-01-03 DIAGNOSIS — E785 Hyperlipidemia, unspecified: Secondary | ICD-10-CM | POA: Diagnosis not present

## 2021-01-03 DIAGNOSIS — C50811 Malignant neoplasm of overlapping sites of right female breast: Secondary | ICD-10-CM | POA: Diagnosis not present

## 2021-01-03 HISTORY — DX: Hyperlipidemia, unspecified: E78.5

## 2021-01-03 HISTORY — PX: BREAST LUMPECTOMY WITH RADIOACTIVE SEED LOCALIZATION: SHX6424

## 2021-01-03 LAB — CBC WITH DIFFERENTIAL/PLATELET
Abs Immature Granulocytes: 0.02 10*3/uL (ref 0.00–0.07)
Basophils Absolute: 0 10*3/uL (ref 0.0–0.1)
Basophils Relative: 1 %
Eosinophils Absolute: 0.2 10*3/uL (ref 0.0–0.5)
Eosinophils Relative: 4 %
HCT: 46.5 % — ABNORMAL HIGH (ref 36.0–46.0)
Hemoglobin: 15.2 g/dL — ABNORMAL HIGH (ref 12.0–15.0)
Immature Granulocytes: 0 %
Lymphocytes Relative: 32 %
Lymphs Abs: 1.5 10*3/uL (ref 0.7–4.0)
MCH: 31.7 pg (ref 26.0–34.0)
MCHC: 32.7 g/dL (ref 30.0–36.0)
MCV: 96.9 fL (ref 80.0–100.0)
Monocytes Absolute: 0.4 10*3/uL (ref 0.1–1.0)
Monocytes Relative: 9 %
Neutro Abs: 2.6 10*3/uL (ref 1.7–7.7)
Neutrophils Relative %: 54 %
Platelets: 224 10*3/uL (ref 150–400)
RBC: 4.8 MIL/uL (ref 3.87–5.11)
RDW: 13.4 % (ref 11.5–15.5)
WBC: 4.8 10*3/uL (ref 4.0–10.5)
nRBC: 0 % (ref 0.0–0.2)

## 2021-01-03 LAB — COMPREHENSIVE METABOLIC PANEL
ALT: 12 U/L (ref 0–44)
AST: 18 U/L (ref 15–41)
Albumin: 4.1 g/dL (ref 3.5–5.0)
Alkaline Phosphatase: 44 U/L (ref 38–126)
Anion gap: 8 (ref 5–15)
BUN: 11 mg/dL (ref 8–23)
CO2: 28 mmol/L (ref 22–32)
Calcium: 9.2 mg/dL (ref 8.9–10.3)
Chloride: 104 mmol/L (ref 98–111)
Creatinine, Ser: 0.83 mg/dL (ref 0.44–1.00)
GFR, Estimated: >60 mL/min (ref >60)
Glucose, Bld: 113 mg/dL — ABNORMAL HIGH (ref 70–99)
Potassium: 3.9 mmol/L (ref 3.5–5.1)
Sodium: 140 mmol/L (ref 135–145)
Total Bilirubin: 0.9 mg/dL (ref 0.3–1.2)
Total Protein: 7.1 g/dL (ref 6.5–8.1)

## 2021-01-03 SURGERY — BREAST LUMPECTOMY WITH RADIOACTIVE SEED LOCALIZATION
Anesthesia: General | Site: Breast | Laterality: Right

## 2021-01-03 MED ORDER — DEXAMETHASONE SODIUM PHOSPHATE 4 MG/ML IJ SOLN
INTRAMUSCULAR | Status: DC | PRN
  Administered 2021-01-03: 4 mg via INTRAVENOUS

## 2021-01-03 MED ORDER — ACETAMINOPHEN 10 MG/ML IV SOLN: 1000.0000 mg | Freq: Once | INTRAVENOUS | Status: DC | PRN

## 2021-01-03 MED ORDER — LIDOCAINE 2% (20 MG/ML) 5 ML SYRINGE
INTRAMUSCULAR | Status: AC
  Filled 2021-01-03: qty 5

## 2021-01-03 MED ORDER — CEFAZOLIN SODIUM-DEXTROSE 2-4 GM/100ML-% IV SOLN
2.0000 g | INTRAVENOUS | Status: AC
  Administered 2021-01-03: 2 g via INTRAVENOUS

## 2021-01-03 MED ORDER — EPHEDRINE SULFATE 50 MG/ML IJ SOLN
INTRAMUSCULAR | Status: DC | PRN
  Administered 2021-01-03: 10 mg via INTRAVENOUS

## 2021-01-03 MED ORDER — FENTANYL CITRATE (PF) 100 MCG/2ML IJ SOLN
INTRAMUSCULAR | Status: AC
  Filled 2021-01-03: qty 2

## 2021-01-03 MED ORDER — LACTATED RINGERS IV SOLN: INTRAVENOUS | Status: DC

## 2021-01-03 MED ORDER — CHLORHEXIDINE GLUCONATE CLOTH 2 % EX PADS: 6.0000 | MEDICATED_PAD | Freq: Once | CUTANEOUS | Status: DC

## 2021-01-03 MED ORDER — VANCOMYCIN HCL 500 MG IV SOLR
INTRAVENOUS | Status: AC
  Filled 2021-01-03: qty 10

## 2021-01-03 MED ORDER — FENTANYL CITRATE (PF) 100 MCG/2ML IJ SOLN: 25.0000 ug | INTRAMUSCULAR | Status: DC | PRN

## 2021-01-03 MED ORDER — OXYCODONE HCL 5 MG PO TABS
ORAL_TABLET | ORAL | Status: AC
  Filled 2021-01-03: qty 1

## 2021-01-03 MED ORDER — CEFAZOLIN SODIUM-DEXTROSE 2-4 GM/100ML-% IV SOLN
INTRAVENOUS | Status: AC
  Filled 2021-01-03: qty 100

## 2021-01-03 MED ORDER — SODIUM CHLORIDE 0.9 % IV SOLN
INTRAVENOUS | Status: DC | PRN
  Administered 2021-01-03: 500 mL

## 2021-01-03 MED ORDER — DEXAMETHASONE SODIUM PHOSPHATE 10 MG/ML IJ SOLN
INTRAMUSCULAR | Status: AC
  Filled 2021-01-03: qty 1

## 2021-01-03 MED ORDER — ONDANSETRON HCL 4 MG/2ML IJ SOLN: 4.0000 mg | Freq: Once | INTRAMUSCULAR | Status: DC | PRN

## 2021-01-03 MED ORDER — VANCOMYCIN HCL 500 MG IV SOLR
INTRAVENOUS | Status: DC | PRN
  Administered 2021-01-03: 500 mg via TOPICAL

## 2021-01-03 MED ORDER — ONDANSETRON HCL 4 MG/2ML IJ SOLN
INTRAMUSCULAR | Status: AC
  Filled 2021-01-03: qty 2

## 2021-01-03 MED ORDER — ACETAMINOPHEN 500 MG PO TABS
1000.0000 mg | ORAL_TABLET | ORAL | Status: AC
  Administered 2021-01-03: 1000 mg via ORAL

## 2021-01-03 MED ORDER — FENTANYL CITRATE (PF) 100 MCG/2ML IJ SOLN
INTRAMUSCULAR | Status: DC | PRN
  Administered 2021-01-03: 50 ug via INTRAVENOUS
  Administered 2021-01-03: 25 ug via INTRAVENOUS

## 2021-01-03 MED ORDER — HYDROCODONE-ACETAMINOPHEN 5-325 MG PO TABS: 1.0000 | ORAL_TABLET | Freq: Four times a day (QID) | ORAL | 0 refills | Status: DC | PRN

## 2021-01-03 MED ORDER — PROPOFOL 500 MG/50ML IV EMUL
INTRAVENOUS | Status: AC
  Filled 2021-01-03: qty 50

## 2021-01-03 MED ORDER — ACETAMINOPHEN 500 MG PO TABS
ORAL_TABLET | ORAL | Status: AC
  Filled 2021-01-03: qty 2

## 2021-01-03 MED ORDER — OXYCODONE HCL 5 MG PO TABS
5.0000 mg | ORAL_TABLET | Freq: Once | ORAL | Status: AC
  Administered 2021-01-03: 5 mg via ORAL

## 2021-01-03 MED ORDER — PROPOFOL 10 MG/ML IV BOLUS
INTRAVENOUS | Status: DC | PRN
  Administered 2021-01-03: 20 mg via INTRAVENOUS
  Administered 2021-01-03: 100 mg via INTRAVENOUS

## 2021-01-03 MED ORDER — SODIUM CHLORIDE 0.9 % IV SOLN
INTRAVENOUS | Status: AC
  Filled 2021-01-03: qty 10

## 2021-01-03 MED ORDER — BUPIVACAINE-EPINEPHRINE (PF) 0.25% -1:200000 IJ SOLN
INTRAMUSCULAR | Status: DC | PRN
  Administered 2021-01-03: 20 mL

## 2021-01-03 MED ORDER — ONDANSETRON HCL 4 MG/2ML IJ SOLN
INTRAMUSCULAR | Status: DC | PRN
  Administered 2021-01-03: 4 mg via INTRAVENOUS

## 2021-01-03 SURGICAL SUPPLY — 50 items
ADH SKN CLS APL DERMABOND .7 (GAUZE/BANDAGES/DRESSINGS) ×1
APL PRP STRL LF DISP 70% ISPRP (MISCELLANEOUS) ×1
APPLIER CLIP 9.375 MED OPEN (MISCELLANEOUS) ×2
APR CLP MED 9.3 20 MLT OPN (MISCELLANEOUS) ×1
BINDER BREAST LRG (GAUZE/BANDAGES/DRESSINGS) IMPLANT
BINDER BREAST MEDIUM (GAUZE/BANDAGES/DRESSINGS) IMPLANT
BINDER BREAST XLRG (GAUZE/BANDAGES/DRESSINGS) ×2 IMPLANT
BINDER BREAST XXLRG (GAUZE/BANDAGES/DRESSINGS) IMPLANT
BLADE SURG 15 STRL LF DISP TIS (BLADE) ×1 IMPLANT
BLADE SURG 15 STRL SS (BLADE) ×2
CANISTER SUC SOCK COL 7IN (MISCELLANEOUS) IMPLANT
CANISTER SUCT 1200ML W/VALVE (MISCELLANEOUS) ×2 IMPLANT
CHLORAPREP W/TINT 26 (MISCELLANEOUS) ×2 IMPLANT
CLIP APPLIE 9.375 MED OPEN (MISCELLANEOUS) ×1 IMPLANT
COVER BACK TABLE 60X90IN (DRAPES) ×2 IMPLANT
COVER MAYO STAND STRL (DRAPES) ×2 IMPLANT
COVER PROBE W GEL 5X96 (DRAPES) ×2 IMPLANT
DECANTER SPIKE VIAL GLASS SM (MISCELLANEOUS) IMPLANT
DERMABOND ADVANCED (GAUZE/BANDAGES/DRESSINGS) ×1
DERMABOND ADVANCED .7 DNX12 (GAUZE/BANDAGES/DRESSINGS) ×1 IMPLANT
DRAPE LAPAROTOMY 100X72 PEDS (DRAPES) ×2 IMPLANT
DRAPE UTILITY XL STRL (DRAPES) ×2 IMPLANT
ELECT COATED BLADE 2.86 ST (ELECTRODE) ×2 IMPLANT
ELECT REM PT RETURN 9FT ADLT (ELECTROSURGICAL) ×2
ELECTRODE REM PT RTRN 9FT ADLT (ELECTROSURGICAL) ×1 IMPLANT
GLOVE SRG 8 PF TXTR STRL LF DI (GLOVE) ×1 IMPLANT
GLOVE SURG LTX SZ8 (GLOVE) ×2 IMPLANT
GLOVE SURG UNDER POLY LF SZ7 (GLOVE) ×2 IMPLANT
GLOVE SURG UNDER POLY LF SZ8 (GLOVE) ×2
GOWN STRL REUS W/ TWL LRG LVL3 (GOWN DISPOSABLE) ×2 IMPLANT
GOWN STRL REUS W/ TWL XL LVL3 (GOWN DISPOSABLE) ×1 IMPLANT
GOWN STRL REUS W/TWL LRG LVL3 (GOWN DISPOSABLE) ×4
GOWN STRL REUS W/TWL XL LVL3 (GOWN DISPOSABLE) ×2
HEMOSTAT ARISTA ABSORB 3G PWDR (HEMOSTASIS) IMPLANT
HEMOSTAT SNOW SURGICEL 2X4 (HEMOSTASIS) IMPLANT
KIT MARKER MARGIN INK (KITS) ×2 IMPLANT
NEEDLE HYPO 25X1 1.5 SAFETY (NEEDLE) ×2 IMPLANT
NS IRRIG 1000ML POUR BTL (IV SOLUTION) ×2 IMPLANT
PACK BASIN DAY SURGERY FS (CUSTOM PROCEDURE TRAY) ×2 IMPLANT
PENCIL SMOKE EVACUATOR (MISCELLANEOUS) ×2 IMPLANT
SLEEVE SCD COMPRESS KNEE MED (STOCKING) ×2 IMPLANT
SPONGE T-LAP 4X18 ~~LOC~~+RFID (SPONGE) ×2 IMPLANT
SUT MNCRL AB 4-0 PS2 18 (SUTURE) ×2 IMPLANT
SUT SILK 2 0 SH (SUTURE) IMPLANT
SUT VICRYL 3-0 CR8 SH (SUTURE) ×2 IMPLANT
SYR CONTROL 10ML LL (SYRINGE) ×2 IMPLANT
TOWEL GREEN STERILE FF (TOWEL DISPOSABLE) ×2 IMPLANT
TRAY FAXITRON CT DISP (TRAY / TRAY PROCEDURE) ×2 IMPLANT
TUBE CONNECTING 20X1/4 (TUBING) ×2 IMPLANT
YANKAUER SUCT BULB TIP NO VENT (SUCTIONS) ×2 IMPLANT

## 2021-01-03 NOTE — Interval H&P Note (Signed)
History and Physical Interval Note:  01/03/2021 7:20 AM  Michele [name]  has presented today for surgery, with the diagnosis of RIGHT BREAST CANCER.  The various methods of treatment have been discussed with the patient and family. After consideration of risks, benefits and other options for treatment, the patient has consented to  Procedure(s): RIGHT BREAST LUMPECTOMY WITH RADIOACTIVE SEED LOCALIZATION (Right) as a surgical intervention.  The patient's history has been reviewed, patient examined, no change in status, stable for surgery.  I have reviewed the patient's chart and labs.  Questions were answered to the patient's satisfaction.   The procedure has been discussed with the patient. Alternatives to surgery have been discussed with the patient.  Risks of surgery include bleeding,  Infection,  Seroma formation, death,  and the need for further surgery.   The patient understands and wishes to proceed.   [place]

## 2021-01-03 NOTE — Anesthesia Procedure Notes (Signed)
Procedure Name: LMA Insertion Date/Time: 01/03/2021 7:40 AM Performed by: Maryella Shivers, CRNA Pre-anesthesia Checklist: Patient identified, Emergency Drugs available, Suction available and Patient being monitored Patient Re-evaluated:Patient Re-evaluated prior to induction Oxygen Delivery Method: Circle system utilized Preoxygenation: Pre-oxygenation with 100% oxygen Induction Type: IV induction Ventilation: Mask ventilation without difficulty LMA: LMA inserted LMA Size: 4.0 Number of attempts: 1 Airway Equipment and Method: Bite block Placement Confirmation: positive ETCO2 Tube secured with: Tape Dental Injury: Teeth and Oropharynx as per pre-operative assessment

## 2021-01-03 NOTE — Anesthesia Postprocedure Evaluation (Signed)
Anesthesia Post Note  Patient: Michele [name]  Procedure(s) Performed: RIGHT BREAST LUMPECTOMY WITH RADIOACTIVE SEED LOCALIZATION (Right: Breast)     Patient location during evaluation: PACU Anesthesia Type: General Level of consciousness: awake and alert Pain management: pain level controlled Vital Signs Assessment: post-procedure vital signs reviewed and stable Respiratory status: spontaneous breathing, nonlabored ventilation, respiratory function stable and patient connected to nasal cannula oxygen Cardiovascular status: blood pressure returned to baseline and stable Postop Assessment: no apparent nausea or vomiting Anesthetic complications: no   No notable events documented.  Last Vitals:  Vitals:   01/03/21 0900 01/03/21 0925  BP: (!) 120/49 128/63  Pulse: 70 70  Resp: (!) 8 16  Temp:  (!) 36.3 C  SpO2: 96% 95%    Last Pain:  Vitals:   01/03/21 0913  TempSrc:   PainSc: 6                  [place]et [place]

## 2021-01-03 NOTE — Discharge Instructions (Addendum)
[place] Office Phone Number [phone]  BREAST BIOPSY/ PARTIAL MASTECTOMY: POST OP INSTRUCTIONS  Always review your discharge instruction sheet given to you by the facility where your surgery was performed.  IF YOU HAVE DISABILITY OR FAMILY LEAVE FORMS, YOU MUST BRING THEM TO THE OFFICE FOR PROCESSING.  DO NOT GIVE THEM TO YOUR DOCTOR.  A prescription for pain medication may be given to you upon discharge.  Take your pain medication as prescribed, if needed.  If narcotic pain medicine is not needed, then you may take acetaminophen (Tylenol) or ibuprofen (Advil) as needed. Take your usually prescribed medications unless otherwise directed If you need a refill on your pain medication, please contact your pharmacy.  They will contact our office to request authorization.  Prescriptions will not be filled after 5pm or on week-ends. You should eat very light the first 24 hours after surgery, such as soup, crackers, pudding, etc.  Resume your normal diet the day after surgery. Most patients will experience some swelling and bruising in the breast.  Ice packs and a good support bra will help.  Swelling and bruising can take several days to resolve.  It is common to experience some constipation if taking pain medication after surgery.  Increasing fluid intake and taking a stool softener will usually help or prevent this problem from occurring.  A mild laxative (Milk of Magnesia or Miralax) should be taken according to package directions if there are no bowel movements after 48 hours. Unless discharge instructions indicate otherwise, you may remove your bandages 24-48 hours after surgery, and you may shower at that time.  You may have steri-strips (small skin tapes) in place directly over the incision.  These strips should be left on the skin for 7-10 days.  If your surgeon used skin glue on the incision, you may shower in 24 hours.  The glue will flake off over the next 2-3 weeks.  Any  sutures or staples will be removed at the office during your follow-up visit. ACTIVITIES:  You may resume regular daily activities (gradually increasing) beginning the next day.  Wearing a good support bra or sports bra minimizes pain and swelling.  You may have sexual intercourse when it is comfortable. You may drive when you no longer are taking prescription pain medication, you can comfortably wear a seatbelt, and you can safely maneuver your car and apply brakes. RETURN TO WORK:  ______________________________________________________________________________________ Dennis Bast should see your doctor in the office for a follow-up appointment approximately two weeks after your surgery.  Your doctor's nurse will typically make your follow-up appointment when she calls you with your pathology report.  Expect your pathology report 2-3 business days after your surgery.  You may call to check if you do not hear from [place] after three days. OTHER INSTRUCTIONS: _______________________________________________________________________________________________ _____________________________________________________________________________________________________________________________________ _____________________________________________________________________________________________________________________________________ _____________________________________________________________________________________________________________________________________  WHEN TO CALL YOUR DOCTOR: Fever over 101.0 Nausea and/or vomiting. Extreme swelling or bruising. Continued bleeding from incision. Increased pain, redness, or drainage from the incision.  The clinic staff is available to answer your questions during regular business hours.  Please don't hesitate to call and ask to speak to one of the nurses for clinical concerns.  If you have a medical emergency, go to the nearest emergency room or call 911.  A surgeon from [facility] is always on call at the hospital.  For further questions, please visit [email or web address]   Post Anesthesia Home Care Instructions  Activity: Get plenty of rest for the remainder of the  day. A responsible individual must stay with you for 24 hours following the procedure.  For the next 24 hours, DO NOT: -Drive a car -Paediatric nurse -Drink alcoholic beverages -Take any medication unless instructed by your physician -Make any legal decisions or sign important papers.  Meals: Start with liquid foods such as gelatin or soup. Progress to regular foods as tolerated. Avoid greasy, spicy, heavy foods. If nausea and/or vomiting occur, drink only clear liquids until the nausea and/or vomiting subsides. Call your physician if vomiting continues.  Special Instructions/Symptoms: Your throat may feel dry or sore from the anesthesia or the breathing tube placed in your throat during surgery. If this causes discomfort, gargle with warm salt water. The discomfort should disappear within 24 hours.  If you had a scopolamine patch placed behind your ear for the management of post- operative nausea and/or vomiting:  1. The medication in the patch is effective for 72 hours, after which it should be removed.  Wrap patch in a tissue and discard in the trash. Wash hands thoroughly with soap and water. 2. You may remove the patch earlier than 72 hours if you experience unpleasant side effects which may include dry mouth, dizziness or visual disturbances. 3. Avoid touching the patch. Wash your hands with soap and water after contact with the patch.        Next dose of Tylenol may be given at 1p

## 2021-01-03 NOTE — Op Note (Signed)
Preoperative diagnosis: Stage I right breast cancer upper outer quadrant  Postoperative diagnosis: Same  Procedure: Right breast seed localized lumpectomy  Surgeon: Erroll Luna, MD  Anesthesia: LMA with 0.25% Marcaine plain  EBL: Minimal  Specimen: Right breast tissue with seed and clip verified by Faxitron.  Gross margins negative the deep margin being the pectoralis fascia.  Drains: None  IV fluids: Per anesthesia record  Indications for procedure: The patient is a 80 year old female with stage I right breast cancer.  She had a small lesion was low-grade.  We discussed the pros and cons of the sentinel lymph node mapping and she opted out of that.  Risks and benefits of surgery discussed.  Long-term expectations and treatments were discussed.  All surgical options were discussed as well as complications of surgery.  She opted for breast conserving surgery after being seen in a multidisciplinary clinic platform.The procedure has been discussed with the patient. Alternatives to surgery have been discussed with the patient.  Risks of surgery include bleeding,  Infection,  Seroma formation, death,  and the need for further surgery.   The patient understands and wishes to proceed.     Description of procedure: The patient was met in the holding area and questions were answered.  Films were available for review.  All questions were answered the right breast was marked as the correct site.  She was then taken back to the operating.  She is placed supine upon the OR table.  After induction of general anesthesia, the right breast was prepped and draped in sterile fashion timeout performed.  Proper patient, site and procedure verified.  Appropriate preoperative antibiotics were administered.  Neoprobe used to identify the seed right breast upper outer quadrant.  A curvilinear incision was made over the signal.  Dissection was carried down and all tissue and the seed and clip were excised with a  grossly negative margin.  Of note the deep margin was the pectoralis muscle and the fascia was taken with.  The Faxitron image revealed the seed and clip to be presents with what appeared to be wide margins.  The cavity is irrigated.  Is made hemostatic with cautery.  Clips were placed.  Vancomycin powder placed.  Wound closed with deep layer 3-0 Vicryl the skin closed with 4 Monocryl.  Dermabond applied.  All counts found to be correct.  Breast binder placed.  Patient awoke extubated taken to recovery in satisfactory condition.

## 2021-01-03 NOTE — H&P (Signed)
History of Present Illness: Michele [name] is a 80 y.o. female who is seen today as an office consultation at the request of Dr. Lindi Adie for evaluation of Breast Cancer .   Patient presents for evaluation of right breast cancer upper outer quadrant hormone status unknown. History of left breast DCIS treated in 2019 by Dr. Dalbert Batman with lumpectomy and radiation therapy. She was noted on follow-up mammogram to have a 5 mm focus right breast upper outer quadrant. Core biopsy showed grade 1/grade 2 invasive ductal carcinoma. Receptor status pending. She presents for discussion of treatment options. Otherwise, she is done well without complaint.  Review of Systems: A complete review of systems was obtained from the patient. I have reviewed this information and discussed as appropriate with the patient. See HPI as well for other ROS.    Medical History: Past Medical History:  Diagnosis Date   GERD (gastroesophageal reflux disease)   Hyperlipidemia   There is no problem list on file for this patient.  Past Surgical History:  Procedure Laterality Date   breast surgery    Allergies  Allergen Reactions   Azithromycin Itching and Rash   Penicillins Other (See Comments)  REACTION: Does not work   Reglan [Metoclopramide] Other (See Comments)   Current Outpatient Medications on File Prior to Visit  Medication Sig Dispense Refill   aspirin 81 mg Cap 1 tablet   calcium carb-vitamin D3-vit K2 600 mg-1,000 unit-90 mcg Tab 1 capsule   citalopram (CELEXA) 10 MG tablet 1 tablet   diphenhydrAMINE (BENADRYL) 25 mg tablet 1 tablet as needed   No current facility-administered medications on file prior to visit.   Family History  Problem Relation Age of Onset   High blood pressure (Hypertension) Father   Coronary Artery Disease (Blocked arteries around heart) Father   Breast cancer Sister    Social History   Tobacco Use  Smoking Status Former Smoker   Quit date: 1984   Years since quitting:  38.7  Smokeless Tobacco Never Used    Social History   Socioeconomic History   Marital status: Married  Tobacco Use   Smoking status: Former Smoker  Quit date: 1984  Years since quitting: 38.7   Smokeless tobacco: Never Used  Substance and Sexual Activity   Alcohol use: Never   Drug use: Never   Objective:   Vitals:  11/25/20 1026  BP: 136/86  Pulse: 84  Temp: 36.8 C (98.2 F)  SpO2: 98%  Weight: 73.9 kg (163 lb)  Height: 162.6 cm (5\' 4" )   Body mass index is 27.98 kg/m.  Physical Exam Constitutional:  Appearance: Normal appearance.  HENT:  Head: Normocephalic.  Eyes:  Pupils: Pupils are equal, round, and reactive to light.  Cardiovascular:  Rate and Rhythm: Normal rate.  Chest:  Breasts:  Right: Normal.   Musculoskeletal:  General: Normal range of motion.  Cervical back: Normal range of motion and neck supple.  Lymphadenopathy:  Upper Body:  Right upper body: No supraclavicular or axillary adenopathy.  Left upper body: No supraclavicular or axillary adenopathy.  Neurological:  General: No focal deficit present.  Mental Status: She is alert.  Psychiatric:  Mood and Affect: Mood normal.  Behavior: Behavior normal.     Labs, Imaging and Diagnostic Testing: Diagnosis Breast, right, needle core biopsy, right breast 9:00 - INVASIVE DUCTAL CARCINOMA, GRADE 1/2. - SEE MICROSCOPIC DESCRIPTION Microscopic Comment The greatest tumor dimension is 0.6 cm. A breast prognostic profile will be performed. Dr. Martinique agrees. Called to the  [facility] on 11/18/20) Claudette Laws MD Pathologist, Electronic Signature (Case signed 11/18/2020) Specimen Gross and Clinical Information Specimen Comment CIT: <74min, TIF: 3:00pm, ribbon clip Specimen(s) Obtained: Breast, right, needle core biopsy, right breast 9:00 Specimen Clinical  Assessment and Plan:  Diagnoses and all orders for this visit:  Malignant neoplasm of upper-outer quadrant of right  female breast, unspecified estrogen receptor status (CMS-HCC)    Stage I right breast cancer upper outer quadrant. 5 mm in maximal diameter. Patient desires lumpectomy alone which I think is appropriate given her small size and low-grade. Discussed sentinel lymph node mapping with complications of lymphedema and pain. Likelihood of finding anything significant is 8% or less and most studies and clinical impact would be minimal in her case. She is agreeable to forego sentinel lymph node mapping. Discussed lumpectomy with risk of bleeding, infection, cosmetic deformity, pain, seroma, need for other treatments and/or reexcision of surgery and she agrees to proceed. Refer to medical and radiation oncology. Receptors still pending. This could change her plan and will readjust depending on when the receptor status report is released by pathology otherwise hopefully proceed with lumpectomy No follow-ups on file.  Kennieth Francois, MD

## 2021-01-03 NOTE — Transfer of Care (Signed)
Immediate Anesthesia Transfer of Care Note  Patient: Michele [name]  Procedure(s) Performed: RIGHT BREAST LUMPECTOMY WITH RADIOACTIVE SEED LOCALIZATION (Right: Breast)  Patient Location: PACU  Anesthesia Type:General  Level of Consciousness: sedated  Airway & Oxygen Therapy: Patient Spontanous Breathing and Patient connected to face mask oxygen  Post-op Assessment: Report given to RN and Post -op Vital signs reviewed and stable  Post vital signs: Reviewed and stable  Last Vitals:  Vitals Value Taken Time  BP 146/61 01/03/21 0830  Temp    Pulse 72 01/03/21 0830  Resp 15 01/03/21 0830  SpO2 100 % 01/03/21 0830  Vitals shown include unvalidated device data.  Last Pain:  Vitals:   01/03/21 0642  TempSrc: Oral  PainSc: 0-No pain      Patients Stated Pain Goal: 3 (06/34/94 9447)  Complications: No notable events documented.

## 2021-01-04 ENCOUNTER — Ambulatory Visit: Payer: Medicare PPO | Admitting: Cardiovascular Disease

## 2021-01-04 LAB — SURGICAL PATHOLOGY

## 2021-01-05 ENCOUNTER — Encounter (HOSPITAL_BASED_OUTPATIENT_CLINIC_OR_DEPARTMENT_OTHER): Payer: Self-pay | Admitting: Surgery

## 2021-01-09 ENCOUNTER — Ambulatory Visit: Payer: Self-pay | Admitting: Genetic Counselor

## 2021-01-09 ENCOUNTER — Telehealth: Payer: Self-pay | Admitting: Genetic Counselor

## 2021-01-09 ENCOUNTER — Encounter: Payer: Self-pay | Admitting: Genetic Counselor

## 2021-01-09 DIAGNOSIS — Z1379 Encounter for other screening for genetic and chromosomal anomalies: Secondary | ICD-10-CM

## 2021-01-09 DIAGNOSIS — D0512 Intraductal carcinoma in situ of left breast: Secondary | ICD-10-CM

## 2021-01-09 DIAGNOSIS — C50811 Malignant neoplasm of overlapping sites of right female breast: Secondary | ICD-10-CM

## 2021-01-09 DIAGNOSIS — Z803 Family history of malignant neoplasm of breast: Secondary | ICD-10-CM

## 2021-01-09 NOTE — Progress Notes (Signed)
HPI:   Ms. Such was previously seen in the [facility] clinic due to a personal and family history of breast cancer and concerns regarding a hereditary predisposition to cancer. Please refer to our prior cancer genetics clinic note for more information regarding our discussion, assessment and recommendations, at the time. Ms. Donati recent genetic test results were disclosed to her, as were recommendations warranted by these results. These results and recommendations are discussed in more detail below.  CANCER HISTORY:  In 2019, at the age of 80, Ms. Cliett was diagnosed with ductal carcinoma in situ of the left breast (ER-/PR-). The treatment plan included lumpectomy and adjuvant radiation.  In 2022, at the age of 65, Ms. Dupree was diagnosed with invasive ductal carcinoma of the right breast (ER+/PR+/HER2-).  The treatment plan is pending. She also has a history of basal cell carcinoma on her nose (approximately 3 and 5 years ago).     Oncology History  Ductal carcinoma in situ (DCIS) of left breast  09/30/2017 Initial Diagnosis   Screening detected left breast calcifications 2 groups upper outer quadrant 1.5 cm and 1.2 cm both of which are biopsy-proven high-grade DCIS with calcifications ER PR negative.  Underneath the nipple 2 groups of calcifications.  Posterior group was biopsy-proven fibrocystic change anterior group could not be biopsied.  Tis NX stage 0   11/08/2017 Surgery   2 left lumpectomies: DCIS high-grade 0.9 cm focally less than 0.1 cm to medial margin; second lumpectomy left subareolar: DCIS with calcifications high-grade 1.2 cm focally less than 0.1 cm superior margin and focally,  0.1 cm to anterior margin ER 0%, PR 0% for both Tis NX stage 0   12/18/2017 - 01/13/2018 Radiation Therapy   Adjuvant radiation therapy   01/03/2021 Genetic Testing   Negative hereditary cancer genetic testing: no pathogenic variants detected in [facility]cer [facility]RNAinsight Panel.   The report date is January 03, 2021.   The CustomNext-Cancer+RNAinsight panel offered by [facility] includes sequencing and rearrangement analysis for the following 47 genes:  APC, ATM, AXIN2, BARD1, BMPR1A, BRCA1, BRCA2, BRIP1, CDH1, CDK4, CDKN2A, CHEK2, DICER1, EPCAM, GREM1, HOXB13, MEN1, MLH1, MSH2, MSH3, MSH6, MUTYH, NBN, NF1, NF2, NTHL1, PALB2, PMS2, POLD1, POLE, PTEN, RAD51C, RAD51D, RECQL, RET, SDHA, SDHAF2, SDHB, SDHC, SDHD, SMAD4, SMARCA4, STK11, TP53, TSC1, TSC2, and VHL.  RNA data is routinely analyzed for use in variant interpretation for all genes.   Malignant neoplasm of overlapping sites of right female breast ([place])  12/08/2020 Initial Diagnosis   Malignant neoplasm of overlapping sites of right female breast ([place])   01/03/2021 Genetic Testing   Negative hereditary cancer genetic testing: no pathogenic variants detected in [facility]Cancer +RNAinsight Panel.  The report date is January 03, 2021.   The CustomNext-Cancer+RNAinsight panel offered by [facility] includes sequencing and rearrangement analysis for the following 47 genes:  APC, ATM, AXIN2, BARD1, BMPR1A, BRCA1, BRCA2, BRIP1, CDH1, CDK4, CDKN2A, CHEK2, DICER1, EPCAM, GREM1, HOXB13, MEN1, MLH1, MSH2, MSH3, MSH6, MUTYH, NBN, NF1, NF2, NTHL1, PALB2, PMS2, POLD1, POLE, PTEN, RAD51C, RAD51D, RECQL, RET, SDHA, SDHAF2, SDHB, SDHC, SDHD, SMAD4, SMARCA4, STK11, TP53, TSC1, TSC2, and VHL.  RNA data is routinely analyzed for use in variant interpretation for all genes.     FAMILY HISTORY:  We obtained a detailed, 4-generation family history.  Significant diagnoses are listed below:      Family History  Problem Relation Age of Onset   Cancer Sister          maternal half  sister; type unknown; thigh?   Breast cancer Sister          maternal half sister; two primaries; mid-late 34s   Breast cancer Niece          dx before 108     Ms. Rahn is unaware of previous family history of genetic testing for hereditary cancer  risks. There is no reported Ashkenazi Jewish ancestry. There is no known consanguinity.  GENETIC TEST RESULTS:  The [facility]-Cancer +RNAinsight Panel found no pathogenic mutations. The CustomNext-Cancer+RNAinsight panel offered by [facility] includes sequencing and rearrangement analysis for the following 47 genes:  APC, ATM, AXIN2, BARD1, BMPR1A, BRCA1, BRCA2, BRIP1, CDH1, CDK4, CDKN2A, CHEK2, DICER1, EPCAM, GREM1, HOXB13, MEN1, MLH1, MSH2, MSH3, MSH6, MUTYH, NBN, NF1, NF2, NTHL1, PALB2, PMS2, POLD1, POLE, PTEN, RAD51C, RAD51D, RECQL, RET, SDHA, SDHAF2, SDHB, SDHC, SDHD, SMAD4, SMARCA4, STK11, TP53, TSC1, TSC2, and VHL.  RNA data is routinely analyzed for use in variant interpretation for all genes.  The test report has been scanned into EPIC and is located under the Molecular Pathology section of the Results Review tab.  A portion of the result report is included below for reference. Genetic testing reported out on January 03, 2021.     Even though a pathogenic variant was not identified, possible explanations for the cancer in the family may include: There may be no hereditary risk for cancer in the family. The cancers in Ms. Dippolito and/or her family may be sporadic/familial or due to other genetic and environmental factors. There may be a gene mutation in one of these genes that current testing methods cannot detect but that chance is small. There could be another gene that has not yet been discovered, or that we have not yet tested, that is responsible for the cancer diagnoses in the family.  It is also possible there is a hereditary cause for the cancer in the family that Ms. Sawatzky did not inherit.  Therefore, it is important to remain in touch with cancer genetics in the future so that we can continue to offer Ms. Blacksher the most up to date genetic testing.    ADDITIONAL GENETIC TESTING:  We discussed with Ms. Gattuso that her genetic testing was fairly extensive.  If there are  other relevant genes identified to increase cancer risk that can be analyzed in the future, we would be happy to discuss and coordinate this testing at that time.    CANCER SCREENING RECOMMENDATIONS:  Ms. Rainwater test result is considered negative (normal).  This means that we have not identified a hereditary cause for her personal history of breast cancer at this time.   An individual's cancer risk and medical management are not determined by genetic test results alone. Overall cancer risk assessment incorporates additional factors, including personal medical history, family history, and any available genetic information that may result in a personalized plan for cancer prevention and surveillance. Therefore, it is recommended she continue to follow the cancer management and screening guidelines provided by her oncology and primary healthcare provider.  RECOMMENDATIONS FOR FAMILY MEMBERS:   Since she did not inherit a identifiable mutation in a cancer predisposition gene included on this panel, her children could not have inherited a known mutation from her in one of these genes. Individuals in this family might be at some increased risk of developing cancer, over the general population risk, due to the family history of cancer.  Individuals in the family should notify their providers of the family history  of cancer. We recommend women in this family have a yearly mammogram beginning at age 44, or 67 years younger than the earliest onset of cancer, an annual clinical breast exam, and perform monthly breast self-exams.   Other members of the family may still carry a pathogenic variant in one of these genes that Ms. Hach did not inherit. Based on the family history, we recommend her niece, who was diagnosed with breast cancer before age 29, have genetic counseling and testing. Ms. Thane will let us know if we can be of any assistance in coordinating genetic counseling and/or testing for this family member.     FOLLOW-UP:  Lastly, we discussed with Ms. Budzinski that cancer genetics is a rapidly advancing field and it is possible that new genetic tests will be appropriate for her and/or her family members in the future. We encouraged her to remain in contact with cancer genetics on an annual basis so we can update her personal and family histories and let her know of advances in cancer genetics that may benefit this family.   Our contact number was provided. Ms. Molitor questions were answered to her satisfaction, and she knows she is welcome to call us at anytime with additional questions or concerns.   Shanieka Blea M. Joette Catching, [place], [facility] Genetic Counselor [email or web address] (P) [phone]

## 2021-01-09 NOTE — Telephone Encounter (Signed)
Revealed negative genetic testing.  Discussed that we do not know why she has breast cancer or why there is cancer in the family. It could be familial, due to a different gene that we are not testing, or maybe our current technology may not be able to pick something up.  It will be important for her to keep in contact with genetics to keep up with whether additional testing may be needed.  Recommended genetic testing for her niece with breast cancer before age 47.

## 2021-01-11 NOTE — Progress Notes (Incomplete)
Patient Care Team: Deland Pretty, MD as PCP - General (Internal Medicine) [name], MD as Consulting Physician (General Surgery) Nicholas Lose, MD as Consulting Physician (Hematology and Oncology) Kyung Rudd, MD as Consulting Physician (Radiation Oncology) Delice Bison Charlestine Massed, NP as Nurse Practitioner (Hematology and Oncology)  DIAGNOSIS: No diagnosis found.  SUMMARY OF ONCOLOGIC HISTORY: Oncology History  Ductal carcinoma in situ (DCIS) of left breast  09/30/2017 Initial Diagnosis   Screening detected left breast calcifications 2 groups upper outer quadrant 1.5 cm and 1.2 cm both of which are biopsy-proven high-grade DCIS with calcifications ER PR negative.  Underneath the nipple 2 groups of calcifications.  Posterior group was biopsy-proven fibrocystic change anterior group could not be biopsied.  Tis NX stage 0   11/08/2017 Surgery   2 left lumpectomies: DCIS high-grade 0.9 cm focally less than 0.1 cm to medial margin; second lumpectomy left subareolar: DCIS with calcifications high-grade 1.2 cm focally less than 0.1 cm superior margin and focally,  0.1 cm to anterior margin ER 0%, PR 0% for both Tis NX stage 0   12/18/2017 - 01/13/2018 Radiation Therapy   Adjuvant radiation therapy   01/03/2021 Genetic Testing   Negative hereditary cancer genetic testing: no pathogenic variants detected in [facility]Cancer +RNAinsight Panel.  The report date is January 03, 2021.   The CustomNext-Cancer+RNAinsight panel offered by [facility] includes sequencing and rearrangement analysis for the following 47 genes:  APC, ATM, AXIN2, BARD1, BMPR1A, BRCA1, BRCA2, BRIP1, CDH1, CDK4, CDKN2A, CHEK2, DICER1, EPCAM, GREM1, HOXB13, MEN1, MLH1, MSH2, MSH3, MSH6, MUTYH, NBN, NF1, NF2, NTHL1, PALB2, PMS2, POLD1, POLE, PTEN, RAD51C, RAD51D, RECQL, RET, SDHA, SDHAF2, SDHB, SDHC, SDHD, SMAD4, SMARCA4, STK11, TP53, TSC1, TSC2, and VHL.  RNA data is routinely analyzed for use in variant  interpretation for all genes.   Malignant neoplasm of overlapping sites of right female breast ([place])  12/08/2020 Initial Diagnosis   Malignant neoplasm of overlapping sites of right female breast ([place])   01/03/2021 Genetic Testing   Negative hereditary cancer genetic testing: no pathogenic variants detected in [facility]Cancer +RNAinsight Panel.  The report date is January 03, 2021.   The CustomNext-Cancer+RNAinsight panel offered by [facility] includes sequencing and rearrangement analysis for the following 47 genes:  APC, ATM, AXIN2, BARD1, BMPR1A, BRCA1, BRCA2, BRIP1, CDH1, CDK4, CDKN2A, CHEK2, DICER1, EPCAM, GREM1, HOXB13, MEN1, MLH1, MSH2, MSH3, MSH6, MUTYH, NBN, NF1, NF2, NTHL1, PALB2, PMS2, POLD1, POLE, PTEN, RAD51C, RAD51D, RECQL, RET, SDHA, SDHAF2, SDHB, SDHC, SDHD, SMAD4, SMARCA4, STK11, TP53, TSC1, TSC2, and VHL.  RNA data is routinely analyzed for use in variant interpretation for all genes.     CHIEF COMPLIANT: Surveillance of left breast DCIS  INTERVAL HISTORY: Michele [name] is a 80 y.o. with above-mentioned history of left breast DCIS treated with two lumpectomies, radiation, and is currently on surveillance. Mammogram on 11/14/2020 showed an irregular right breast mass at 9 o'clock in the right breast. Right breast lumpectomy on grade 2 invasive ductal carcinoma and intermediate grade DCIS. She presents to the clinic today for follow-up.   ALLERGIES:  is allergic to azithromycin, alpha-gal, influenza vaccines, and reglan [metoclopramide].  MEDICATIONS:  Current Outpatient Medications  Medication Sig Dispense Refill   aspirin EC 81 MG tablet Take 81 mg by mouth daily. Swallow whole.     cetirizine (ZYRTEC ALLERGY) 10 MG tablet Take 1 tablet (10 mg total) by mouth 2 (two) times daily. 10 tablet 0   Cholecalciferol (VITAMIN D3) 1000 units CAPS Take by mouth daily.  Citalopram Hydrobromide (CELEXA PO) Take 10 mg by mouth as needed.     EPINEPHrine (EPIPEN 2-PAK)  0.3 mg/0.3 mL IJ SOAJ injection Inject 0.3 mg into the muscle once as needed (for severe allergic reaction). CAll 911 immediately if you have to use this medicine 2 each 0   Evolocumab (REPATHA SURECLICK) 974 MG/ML SOAJ Inject 140 mg into the skin every 14 (fourteen) days. 6 mL 3   fluticasone (VERAMYST) 27.5 MCG/SPRAY nasal spray Place 2 sprays into the nose as needed.     HYDROcodone-acetaminophen (NORCO/VICODIN) 5-325 MG tablet Take 1 tablet by mouth every 6 (six) hours as needed for moderate pain. 15 tablet 0   levocetirizine (XYZAL) 5 MG tablet Take 5 mg by mouth as needed.      polyethylene glycol powder (GLYCOLAX/MIRALAX) 17 GM/SCOOP powder Take 17 g by mouth as needed.     triamcinolone ointment (KENALOG) 0.1 %      No current facility-administered medications for this visit.    PHYSICAL EXAMINATION: ECOG PERFORMANCE STATUS: {CHL ONC ECOG [phone]}  There were no vitals filed for this visit. There were no vitals filed for this visit.  BREAST:*** No palpable masses or nodules in either right or left breasts. No palpable axillary supraclavicular or infraclavicular adenopathy no breast tenderness or nipple discharge. (exam performed in the presence of a chaperone)  LABORATORY DATA:  I have reviewed the data as listed CMP Latest Ref Rng & Units 01/03/2021 10/09/2017 11/17/2014  Glucose 70 - 99 mg/dL 113(H) 113(H) 94  BUN 8 - 23 mg/dL _0 Creatinine 0.44 - 1.00 mg/dL 0.83 0.88 1.00  Sodium 135 - 145 mmol/L 140 141 139  Potassium 3.5 - 5.1 mmol/L 3.9 4.7 4.9  Chloride 98 - 111 mmol/L 104 102 97  CO2 22 - 32 mmol/L 28 28 -  Calcium 8.9 - 10.3 mg/dL 9.2 9.2 9.6  Total Protein 6.5 - 8.1 g/dL 7.1 7.4 6.9  Total Bilirubin 0.3 - 1.2 mg/dL 0.9 0.6 0.5  Alkaline Phos 38 - 126 U/L 44 55 51  AST 15 - 41 U/L _1 ALT 0 - 44 U/L 12 10 -    Lab Results  Component Value Date   WBC 4.8 01/03/2021   HGB 15.2 (H) 01/03/2021   HCT 46.5 (H) 01/03/2021   MCV 96.9 01/03/2021    PLT 224 01/03/2021   NEUTROABS 2.6 01/03/2021    ASSESSMENT & PLAN:  No problem-specific Assessment & Plan notes found for this encounter.    No orders of the defined types were placed in this encounter.  The patient has a good understanding of the overall plan. she agrees with it. she will call with any problems that may develop before the next visit here.  Total time spent: *** mins including face to face time and time spent for planning, charting and coordination of care  Rulon Eisenmenger, MD, MPH 01/11/2021  I, Thana Ates, am acting as scribe for Dr. Nicholas Lose.  {insert scribe attestation}

## 2021-01-12 ENCOUNTER — Telehealth: Payer: Self-pay | Admitting: Radiation Oncology

## 2021-01-12 ENCOUNTER — Inpatient Hospital Stay: Payer: Medicare PPO | Admitting: Hematology and Oncology

## 2021-01-12 NOTE — Assessment & Plan Note (Deleted)
11/08/2017:Twoleft lumpectomies: DCIS high-grade 0.9 cm focally less than 0.1 cm to medial margin; second lumpectomy left subareolar: DCIS with calcifications high-grade 1.2 cm focally less than 0.1 cm superior margin and focally, 0.1 cm to anterior margin ER 0%, PR 0% for both Tis NX stage 0 Adjuvant radiation therapy completed 01/13/2018 No role of antiestrogen therapy because she is ER PR negative  Breast cancer surveillance: 1.Mammograms : 10/25/2020: Right breast mass 2.Breast exam  01/12/2021: Benign

## 2021-01-12 NOTE — Assessment & Plan Note (Deleted)
Screening mammogram detected right breast mass 5 mm, no lymph nodes Biopsy revealed grade 1-2 IDC ER/PR positive HER2 negative with a Ki-67 of 5% 01/03/2021: Right lumpectomy: Grade 2 IDC 1.1 cm, intermediate grade DCIS, LCIS, margins negative, ER 100%, PR 95%, HER2 negative, Ki-67 5%  Pathology counseling: I discussed the final pathology report of the patient provided  a copy of this report. I discussed the margins as well as lymph node surgeries. We also discussed the final staging along with previously performed ER/PR and HER-2/neu testing.  Treatment plan: Adjuvant antiestrogen therapy with letrozole 2.5 mg daily x5 years Given her age and favorable prognostic features, radiation was felt to be optional.  Return to clinic in 3 months for survivorship care plan visit

## 2021-01-12 NOTE — Telephone Encounter (Signed)
I called and left a voicemail for the patient to let her know that we had reviewed her pathology results and that Dr. Lisbeth Renshaw would like to off her a course of radiotherapy to the right breast as during our last visit she was still very interested in aggressive measures to avoid recurrence. I asked her to call me back and would anticipate we would start treatment mid December.

## 2021-01-12 NOTE — Progress Notes (Signed)
Patient Care Team: Deland Pretty, MD as PCP - General (Internal Medicine) [name], MD as Consulting Physician (General Surgery) Nicholas Lose, MD as Consulting Physician (Hematology and Oncology) Kyung Rudd, MD as Consulting Physician (Radiation Oncology) Gardenia Phlegm, NP as Nurse Practitioner (Hematology and Oncology)  DIAGNOSIS:    ICD-10-CM   1. Malignant neoplasm of upper-outer quadrant of right breast in female, estrogen receptor positive (Michele [name])  C50.411    Z17.0       SUMMARY OF ONCOLOGIC HISTORY: Oncology History  Ductal carcinoma in situ (DCIS) of left breast  09/30/2017 Initial Diagnosis   Screening detected left breast calcifications 2 groups upper outer quadrant 1.5 cm and 1.2 cm both of which are biopsy-proven high-grade DCIS with calcifications ER PR negative.  Underneath the nipple 2 groups of calcifications.  Posterior group was biopsy-proven fibrocystic change anterior group could not be biopsied.  Tis NX stage 0   11/08/2017 Surgery   2 left lumpectomies: DCIS high-grade 0.9 cm focally less than 0.1 cm to medial margin; second lumpectomy left subareolar: DCIS with calcifications high-grade 1.2 cm focally less than 0.1 cm superior margin and focally,  0.1 cm to anterior margin ER 0%, PR 0% for both Tis NX stage 0   12/18/2017 - 01/13/2018 Radiation Therapy   Adjuvant radiation therapy   01/03/2021 Genetic Testing   Negative hereditary cancer genetic testing: no pathogenic variants detected in [facility]Can[facility] +RNAinsight Panel.  The report date is January 03, 2021.   The CustomNext-Cancer+RNAinsight panel offered by [facility] includes sequencing and rearrangement analysis for the following 47 genes:  APC, ATM, AXIN2, BARD1, BMPR1A, BRCA1, BRCA2, BRIP1, CDH1, CDK4, CDKN2A, CHEK2, DICER1, EPCAM, GREM1, HOXB13, MEN1, MLH1, MSH2, MSH3, MSH6, MUTYH, NBN, NF1, NF2, NTHL1, PALB2, PMS2, POLD1, POLE, PTEN, RAD51C, RAD51D, RECQL, RET, SDHA, SDHAF2,  SDHB, SDHC, SDHD, SMAD4, SMARCA4, STK11, TP53, TSC1, TSC2, and VHL.  RNA data is routinely analyzed for use in variant interpretation for all genes.   Malignant neoplasm of upper-outer quadrant of right breast in female, estrogen receptor positive (Michele [name])  11/07/2020 Pathology Results   Screening mammogram detected right breast abnormality: Right breast biopsy 9 o'clock position: Grade 1 through 2 IDC ER 100%, PR 95%, Ki-67 5%, HER2 2+ IHC, FISH negative ratio 1.5   12/08/2020 Cancer Staging   Staging form: Breast, AJCC 8th Edition - Clinical stage from 12/08/2020: Stage IA (cT1a, cN0, cM0, G2, ER+, PR+, HER2-) - Signed by Nicholas Lose, MD on 01/12/2021 Stage prefix: Initial diagnosis Method of lymph node assessment: Clinical Histologic grading system: 3 grade system    01/03/2021 Genetic Testing   Negative hereditary cancer genetic testing: no pathogenic variants detected in [facility]Can[facility] +RNAinsight Panel.  The report date is January 03, 2021.   The CustomNext-Cancer+RNAinsight panel offered by [facility] includes sequencing and rearrangement analysis for the following 47 genes:  APC, ATM, AXIN2, BARD1, BMPR1A, BRCA1, BRCA2, BRIP1, CDH1, CDK4, CDKN2A, CHEK2, DICER1, EPCAM, GREM1, HOXB13, MEN1, MLH1, MSH2, MSH3, MSH6, MUTYH, NBN, NF1, NF2, NTHL1, PALB2, PMS2, POLD1, POLE, PTEN, RAD51C, RAD51D, RECQL, RET, SDHA, SDHAF2, SDHB, SDHC, SDHD, SMAD4, SMARCA4, STK11, TP53, TSC1, TSC2, and VHL.  RNA data is routinely analyzed for use in variant interpretation for all genes.   01/03/2021 Surgery   Right lumpectomy: Grade 2 IDC 1.1 cm, intermediate grade DCIS, LCIS, margins negative, ER 100%, PR 95%, HER2 negative, Ki-67 5%     CHIEF COMPLIANT: Surveillance of left breast DCIS  INTERVAL HISTORY: Michele [name] is a 80 y.o.  with above-mentioned history of breast DCIS treated with two lumpectomies, radiation, and is currently on surveillance. Mammogram on 11/14/2020 showed irregular right  breast mass at 9 o'clock in the right breast. Right lumpectomy on 01/03/2021 showed grade 2 invasive ductal carcinoma, intermediate grade DCIS, and lobular carcinoma in situ. She presents to the clinic today for follow-up.   ALLERGIES:  is allergic to azithromycin, alpha-gal, influenza vaccines, and reglan [metoclopramide].  MEDICATIONS:  Current Outpatient Medications  Medication Sig Dispense Refill   aspirin EC 81 MG tablet Take 81 mg by mouth daily. Swallow whole.     cetirizine (ZYRTEC ALLERGY) 10 MG tablet Take 1 tablet (10 mg total) by mouth 2 (two) times daily. 10 tablet 0   Cholecalciferol (VITAMIN D3) 1000 units CAPS Take by mouth daily.     Citalopram Hydrobromide (CELEXA PO) Take 10 mg by mouth as needed.     EPINEPHrine (EPIPEN 2-PAK) 0.3 mg/0.3 mL IJ SOAJ injection Inject 0.3 mg into the muscle once as needed (for severe allergic reaction). CAll 911 immediately if you have to use this medicine 2 each 0   Evolocumab (REPATHA SURECLICK) 518 MG/ML SOAJ Inject 140 mg into the skin every 14 (fourteen) days. 6 mL 3   fluticasone (VERAMYST) 27.5 MCG/SPRAY nasal spray Place 2 sprays into the nose as needed.     HYDROcodone-acetaminophen (NORCO/VICODIN) 5-325 MG tablet Take 1 tablet by mouth every 6 (six) hours as needed for moderate pain. 15 tablet 0   levocetirizine (XYZAL) 5 MG tablet Take 5 mg by mouth as needed.      polyethylene glycol powder (GLYCOLAX/MIRALAX) 17 GM/SCOOP powder Take 17 g by mouth as needed.     triamcinolone ointment (KENALOG) 0.1 %      No current facility-administered medications for this visit.    PHYSICAL EXAMINATION: ECOG PERFORMANCE STATUS: 1 - Symptomatic but completely ambulatory  Vitals:   01/13/21 1114  BP: (!) 107/42  Pulse: 92  Resp: 16  Temp: (!) 97.5 F (36.4 C)  SpO2: 97%   Filed Weights   01/13/21 1114  Weight: 161 lb 9.6 oz (73.3 kg)      LABORATORY DATA:  I have reviewed the data as listed CMP Latest Ref Rng & Units 01/03/2021  10/09/2017 11/17/2014  Glucose 70 - 99 mg/dL 113(H) 113(H) 94  BUN 8 - 23 mg/dL '11 15 13  ' Creatinine 0.44 - 1.00 mg/dL 0.83 0.88 1.00  Sodium 135 - 145 mmol/L 140 141 139  Potassium 3.5 - 5.1 mmol/L 3.9 4.7 4.9  Chloride 98 - 111 mmol/L 104 102 97  CO2 22 - 32 mmol/L 28 28 -  Calcium 8.9 - 10.3 mg/dL 9.2 9.2 9.6  Total Protein 6.5 - 8.1 g/dL 7.1 7.4 6.9  Total Bilirubin 0.3 - 1.2 mg/dL 0.9 0.6 0.5  Alkaline Phos 38 - 126 U/L 44 55 51  AST 15 - 41 U/L '18 16 14  ' ALT 0 - 44 U/L 12 10 -    Lab Results  Component Value Date   WBC 4.8 01/03/2021   HGB 15.2 (H) 01/03/2021   HCT 46.5 (H) 01/03/2021   MCV 96.9 01/03/2021   PLT 224 01/03/2021   NEUTROABS 2.6 01/03/2021    ASSESSMENT & PLAN:  Malignant neoplasm of upper-outer quadrant of right breast in female, estrogen receptor positive ([place]) 01/03/2021:Right lumpectomy: Grade 2 IDC 1.1 cm, intermediate grade DCIS, LCIS, margins negative, ER 100%, PR 95%, HER2 negative, Ki-67 5%  (11/08/2017:Two left lumpectomies: DCIS high-grade 0.9 cm focally less  than 0.1 cm to medial margin; second lumpectomy left subareolar: DCIS with calcifications high-grade 1.2 cm focally less than 0.1 cm superior margin and focally,  0.1 cm to anterior margin ER 0%, PR 0% for both Tis NX stage 0 Adjuvant radiation therapy completed 01/13/2018)  Treatment plan:  +/- XRT 2. Adjuvant antiestrogen therapy with letrozole 2.5 mg daily x5 years I sent a prescription today.  Letrozole counseling: We discussed the risks and benefits of anti-estrogen therapy with aromatase inhibitors. These include but not limited to insomnia, hot flashes, mood changes, vaginal dryness, bone density loss, and weight gain. We strongly believe that the benefits far outweigh the risks. Patient understands these risks and consented to starting treatment. Planned treatment duration is 5 years.  If she does not undergo radiation she will start antiestrogen therapy immediately.  Otherwise she will  started after radiation is complete.    No orders of the defined types were placed in this encounter.  The patient has a good understanding of the overall plan. she agrees with it. she will call with any problems that may develop before the next visit here.  Total time spent: 20 mins including face to face time and time spent for planning, charting and coordination of care  Rulon Eisenmenger, MD, MPH 01/13/2021  I, Thana Ates, am acting as scribe for Dr. Nicholas Lose.  I have reviewed the above documentation for accuracy and completeness, and I agree with the above.

## 2021-01-13 ENCOUNTER — Telehealth: Payer: Self-pay | Admitting: Hematology and Oncology

## 2021-01-13 ENCOUNTER — Other Ambulatory Visit: Payer: Self-pay

## 2021-01-13 ENCOUNTER — Inpatient Hospital Stay: Payer: Medicare PPO | Attending: Hematology and Oncology | Admitting: Hematology and Oncology

## 2021-01-13 DIAGNOSIS — C50411 Malignant neoplasm of upper-outer quadrant of right female breast: Secondary | ICD-10-CM

## 2021-01-13 DIAGNOSIS — Z923 Personal history of irradiation: Secondary | ICD-10-CM | POA: Diagnosis not present

## 2021-01-13 DIAGNOSIS — Z170 Estrogen receptor positive status [ER+]: Secondary | ICD-10-CM

## 2021-01-13 MED ORDER — LETROZOLE 2.5 MG PO TABS: 2.5000 mg | ORAL_TABLET | Freq: Every day | ORAL | 0 refills | Status: DC

## 2021-01-13 NOTE — Telephone Encounter (Signed)
Scheduled appointment per 11/18 los. Left message.

## 2021-01-13 NOTE — Assessment & Plan Note (Signed)
01/03/2021:Right lumpectomy: Grade 2 IDC 1.1 cm, intermediate grade DCIS, LCIS, margins negative, ER 100%, PR 95%, HER2 negative, Ki-67 5%  (11/08/2017:Twoleft lumpectomies: DCIS high-grade 0.9 cm focally less than 0.1 cm to medial margin; second lumpectomy left subareolar: DCIS with calcifications high-grade 1.2 cm focally less than 0.1 cm superior margin and focally, 0.1 cm to anterior margin ER 0%, PR 0% for both Tis NX stage 0 Adjuvant radiation therapy completed 01/13/2018)  Treatment plan: Adjuvant antiestrogen therapy with letrozole 2.5 mg daily x5 years Letrozole counseling: We discussed the risks and benefits of anti-estrogen therapy with aromatase inhibitors. These include but not limited to insomnia, hot flashes, mood changes, vaginal dryness, bone density loss, and weight gain. We strongly believe that the benefits far outweigh the risks. Patient understands these risks and consented to starting treatment. Planned treatment duration is 5 years.  Return to clinic in 3 months for survivorship care plan visit

## 2021-01-16 ENCOUNTER — Telehealth: Payer: Self-pay | Admitting: Radiation Oncology

## 2021-01-16 NOTE — Telephone Encounter (Signed)
LM again for the patient to confirm she was comfortable moving forward with radiation. She has an appt for simulation as well.

## 2021-01-31 ENCOUNTER — Ambulatory Visit
Admission: RE | Admit: 2021-01-31 | Discharge: 2021-01-31 | Disposition: A | Discharge status: Home / Self Care | Payer: Medicare PPO | Source: Ambulatory Visit | Attending: Radiation Oncology | Admitting: Radiation Oncology

## 2021-01-31 ENCOUNTER — Other Ambulatory Visit: Payer: Self-pay

## 2021-01-31 DIAGNOSIS — C50411 Malignant neoplasm of upper-outer quadrant of right female breast: Secondary | ICD-10-CM | POA: Insufficient documentation

## 2021-01-31 DIAGNOSIS — Z170 Estrogen receptor positive status [ER+]: Secondary | ICD-10-CM | POA: Diagnosis not present

## 2021-01-31 DIAGNOSIS — Z510 Encounter for antineoplastic radiation therapy: Secondary | ICD-10-CM | POA: Diagnosis not present

## 2021-02-06 DIAGNOSIS — J3081 Allergic rhinitis due to animal (cat) (dog) hair and dander: Secondary | ICD-10-CM | POA: Diagnosis not present

## 2021-02-06 DIAGNOSIS — Z91018 Allergy to other foods: Secondary | ICD-10-CM | POA: Diagnosis not present

## 2021-02-06 DIAGNOSIS — J3089 Other allergic rhinitis: Secondary | ICD-10-CM | POA: Diagnosis not present

## 2021-02-13 ENCOUNTER — Ambulatory Visit: Payer: Medicare PPO | Admitting: Radiation Oncology

## 2021-02-14 ENCOUNTER — Ambulatory Visit: Payer: Medicare PPO

## 2021-02-15 ENCOUNTER — Ambulatory Visit: Payer: Medicare PPO

## 2021-02-15 DIAGNOSIS — Z170 Estrogen receptor positive status [ER+]: Secondary | ICD-10-CM | POA: Diagnosis not present

## 2021-02-15 DIAGNOSIS — Z510 Encounter for antineoplastic radiation therapy: Secondary | ICD-10-CM | POA: Diagnosis not present

## 2021-02-15 DIAGNOSIS — C50411 Malignant neoplasm of upper-outer quadrant of right female breast: Secondary | ICD-10-CM | POA: Diagnosis not present

## 2021-02-16 ENCOUNTER — Ambulatory Visit: Payer: Medicare PPO

## 2021-02-17 ENCOUNTER — Ambulatory Visit: Payer: Medicare PPO

## 2021-02-21 ENCOUNTER — Other Ambulatory Visit: Payer: Self-pay

## 2021-02-21 ENCOUNTER — Ambulatory Visit
Admission: RE | Admit: 2021-02-21 | Discharge: 2021-02-21 | Disposition: A | Discharge status: Home / Self Care | Payer: Medicare PPO | Source: Ambulatory Visit | Attending: Radiation Oncology | Admitting: Radiation Oncology

## 2021-02-21 DIAGNOSIS — Z510 Encounter for antineoplastic radiation therapy: Secondary | ICD-10-CM | POA: Diagnosis not present

## 2021-02-21 DIAGNOSIS — Z170 Estrogen receptor positive status [ER+]: Secondary | ICD-10-CM | POA: Diagnosis not present

## 2021-02-21 DIAGNOSIS — C50411 Malignant neoplasm of upper-outer quadrant of right female breast: Secondary | ICD-10-CM | POA: Diagnosis not present

## 2021-02-22 ENCOUNTER — Ambulatory Visit
Admission: RE | Admit: 2021-02-22 | Discharge: 2021-02-22 | Disposition: A | Discharge status: Home / Self Care | Payer: Medicare PPO | Source: Ambulatory Visit | Attending: Radiation Oncology | Admitting: Radiation Oncology

## 2021-02-22 ENCOUNTER — Other Ambulatory Visit: Payer: Self-pay

## 2021-02-22 DIAGNOSIS — C50411 Malignant neoplasm of upper-outer quadrant of right female breast: Secondary | ICD-10-CM | POA: Diagnosis not present

## 2021-02-22 DIAGNOSIS — Z170 Estrogen receptor positive status [ER+]: Secondary | ICD-10-CM | POA: Diagnosis not present

## 2021-02-22 DIAGNOSIS — Z510 Encounter for antineoplastic radiation therapy: Secondary | ICD-10-CM | POA: Diagnosis not present

## 2021-02-23 ENCOUNTER — Ambulatory Visit
Admission: RE | Admit: 2021-02-23 | Discharge: 2021-02-23 | Disposition: A | Discharge status: Home / Self Care | Payer: Medicare PPO | Source: Ambulatory Visit | Attending: Radiation Oncology | Admitting: Radiation Oncology

## 2021-02-23 DIAGNOSIS — Z170 Estrogen receptor positive status [ER+]: Secondary | ICD-10-CM | POA: Diagnosis not present

## 2021-02-23 DIAGNOSIS — Z510 Encounter for antineoplastic radiation therapy: Secondary | ICD-10-CM | POA: Diagnosis not present

## 2021-02-23 DIAGNOSIS — C50411 Malignant neoplasm of upper-outer quadrant of right female breast: Secondary | ICD-10-CM | POA: Diagnosis not present

## 2021-02-23 NOTE — Progress Notes (Signed)
HEMATOLOGY-ONCOLOGY   PROGRESS NOTE    History of Present Illness: Michele [name] is a 80 y.o. female with above-mentioned history of surveillance of left breast DCIS treated with two lumpectomies, radiation, and is currently on surveillance. She presents via telephone today for follow-up.  She is tolerating letrozole extremely well without any problems or concerns.  She is currently undergoing radiation.  Oncology History  Ductal carcinoma in situ (DCIS) of left breast  09/30/2017 Initial Diagnosis   Screening detected left breast calcifications 2 groups upper outer quadrant 1.5 cm and 1.2 cm both of which are biopsy-proven high-grade DCIS with calcifications ER PR negative.  Underneath the nipple 2 groups of calcifications.  Posterior group was biopsy-proven fibrocystic change anterior group could not be biopsied.  Tis NX stage 0   11/08/2017 Surgery   2 left lumpectomies: DCIS high-grade 0.9 cm focally less than 0.1 cm to medial margin; second lumpectomy left subareolar: DCIS with calcifications high-grade 1.2 cm focally less than 0.1 cm superior margin and focally,  0.1 cm to anterior margin ER 0%, PR 0% for both Tis NX stage 0   12/18/2017 - 01/13/2018 Radiation Therapy   Adjuvant radiation therapy   01/03/2021 Genetic Testing   Negative hereditary cancer genetic testing: no pathogenic variants detected in [facility]Can[facility]RNAinsight Panel.  The report date is January 03, 2021.   The CustomNext-Cancer+RNAinsight panel offered by [facility] includes sequencing and rearrangement analysis for the following 47 genes:  APC, ATM, AXIN2, BARD1, BMPR1A, BRCA1, BRCA2, BRIP1, CDH1, CDK4, CDKN2A, CHEK2, DICER1, EPCAM, GREM1, HOXB13, MEN1, MLH1, MSH2, MSH3, MSH6, MUTYH, NBN, NF1, NF2, NTHL1, PALB2, PMS2, POLD1, POLE, PTEN, RAD51C, RAD51D, RECQL, RET, SDHA, SDHAF2, SDHB, SDHC, SDHD, SMAD4, SMARCA4, STK11, TP53, TSC1, TSC2, and VHL.  RNA data is routinely analyzed for use in variant  interpretation for all genes.   Malignant neoplasm of upper-outer quadrant of right breast in female, estrogen receptor positive ([place])  11/07/2020 Pathology Results   Screening mammogram detected right breast abnormality: Right breast biopsy 9 o'clock position: Grade 1 through 2 IDC ER 100%, PR 95%, Ki-67 5%, HER2 2+ IHC, FISH negative ratio 1.5   12/08/2020 Cancer Staging   Staging form: Breast, AJCC 8th Edition - Clinical stage from 12/08/2020: Stage IA (cT1a, cN0, cM0, G2, ER+, PR+, HER2-) - Signed by Nicholas Lose, MD on 01/12/2021 Stage prefix: Initial diagnosis Method of lymph node assessment: Clinical Histologic grading system: 3 grade system    01/03/2021 Genetic Testing   Negative hereditary cancer genetic testing: no pathogenic variants detected in [facility]Can[facility] +RNAinsight Panel.  The report date is January 03, 2021.   The CustomNext-Cancer+RNAinsight panel offered by [facility] includes sequencing and rearrangement analysis for the following 47 genes:  APC, ATM, AXIN2, BARD1, BMPR1A, BRCA1, BRCA2, BRIP1, CDH1, CDK4, CDKN2A, CHEK2, DICER1, EPCAM, GREM1, HOXB13, MEN1, MLH1, MSH2, MSH3, MSH6, MUTYH, NBN, NF1, NF2, NTHL1, PALB2, PMS2, POLD1, POLE, PTEN, RAD51C, RAD51D, RECQL, RET, SDHA, SDHAF2, SDHB, SDHC, SDHD, SMAD4, SMARCA4, STK11, TP53, TSC1, TSC2, and VHL.  RNA data is routinely analyzed for use in variant interpretation for all genes.   01/03/2021 Surgery   Right lumpectomy: Grade 2 IDC 1.1 cm, intermediate grade DCIS, LCIS, margins negative, ER 100%, PR 95%, HER2 negative, Ki-67 5%     Observations/Objective:     Assessment Plan:  Malignant neoplasm of upper-outer quadrant of right breast in female, estrogen receptor positive ([place]) 01/03/2021:Right lumpectomy: Grade 2 IDC 1.1 cm, intermediate grade DCIS, LCIS, margins negative, ER 100%, PR 95%,  HER2 negative, Ki-67 5%   (11/08/2017:Two left lumpectomies: DCIS high-grade 0.9 cm focally less than 0.1 cm to medial  margin; second lumpectomy left subareolar: DCIS with calcifications high-grade 1.2 cm focally less than 0.1 cm superior margin and focally,  0.1 cm to anterior margin ER 0%, PR 0% for both Tis NX stage 0 Adjuvant radiation therapy completed 01/13/2018)   Treatment plan:  XRT started 02/22/2021 2. Adjuvant antiestrogen therapy with letrozole 2.5 mg daily x5 years started November 2022    Letrozole toxicities: Tolerating it extremely well without any problems or concerns. Mild joint stiffness especially in the shoulders.  Return to clinic 3 months after finishing radiation for survivorship care plan visit.  I discussed the assessment and treatment plan with the patient. The patient was provided an opportunity to ask questions and all were answered. The patient agreed with the plan and demonstrated an understanding of the instructions. The patient was advised to call back or seek an in-person evaluation if the symptoms worsen or if the condition fails to improve as anticipated.   Total time spent: 20 mins including non-face to face time and time spent for planning, charting and coordination of care  Rulon Eisenmenger, MD 02/24/2021    I, Thana Ates, am acting as scribe for Nicholas Lose, MD.  I have reviewed the above documentation for accuracy and completeness, and I agree with the above.

## 2021-02-24 ENCOUNTER — Inpatient Hospital Stay (HOSPITAL_BASED_OUTPATIENT_CLINIC_OR_DEPARTMENT_OTHER): Payer: Medicare PPO | Admitting: Hematology and Oncology

## 2021-02-24 ENCOUNTER — Other Ambulatory Visit: Payer: Self-pay

## 2021-02-24 ENCOUNTER — Ambulatory Visit
Admission: RE | Admit: 2021-02-24 | Discharge: 2021-02-24 | Disposition: A | Discharge status: Home / Self Care | Payer: Medicare PPO | Source: Ambulatory Visit | Attending: Radiation Oncology | Admitting: Radiation Oncology

## 2021-02-24 DIAGNOSIS — C50411 Malignant neoplasm of upper-outer quadrant of right female breast: Secondary | ICD-10-CM

## 2021-02-24 DIAGNOSIS — Z923 Personal history of irradiation: Secondary | ICD-10-CM | POA: Insufficient documentation

## 2021-02-24 DIAGNOSIS — Z510 Encounter for antineoplastic radiation therapy: Secondary | ICD-10-CM | POA: Diagnosis not present

## 2021-02-24 DIAGNOSIS — Z170 Estrogen receptor positive status [ER+]: Secondary | ICD-10-CM | POA: Diagnosis not present

## 2021-02-24 MED ORDER — ALRA NON-METALLIC DEODORANT (RAD-ONC)
1.0000 "application " | Freq: Once | TOPICAL | Status: AC
  Administered 2021-02-24: 1 via TOPICAL

## 2021-02-24 MED ORDER — RADIAPLEXRX EX GEL: Freq: Once | CUTANEOUS | Status: AC

## 2021-02-24 MED ORDER — LETROZOLE 2.5 MG PO TABS: 2.5000 mg | ORAL_TABLET | Freq: Every day | ORAL | 3 refills | Status: DC

## 2021-02-24 NOTE — Assessment & Plan Note (Signed)
01/03/2021:Right lumpectomy: Grade 2 IDC 1.1 cm, intermediate grade DCIS, LCIS, margins negative, ER 100%, PR 95%, HER2 negative, Ki-67 5%  (11/08/2017:Twoleft lumpectomies: DCIS high-grade 0.9 cm focally less than 0.1 cm to medial margin; second lumpectomy left subareolar: DCIS with calcifications high-grade 1.2 cm focally less than 0.1 cm superior margin and focally, 0.1 cm to anterior margin ER 0%, PR 0% for both Tis NX stage 0 Adjuvant radiation therapy completed 01/13/2018)  Treatment plan:  1. XRT started 02/22/2021 2. Adjuvant antiestrogen therapy with letrozole 2.5 mg daily x5 years I sent a prescription today.  Letrozole toxicities:

## 2021-02-24 NOTE — Progress Notes (Signed)
Pt here for patient teaching.  Pt given skin care instructions, Alra deodorant, and Radiaplex gel.  Reviewed areas of pertinence such as fatigue, hair loss, skin changes, breast tenderness, and breast swelling . Pt able to give teach back of to pat skin and use unscented/gentle soap,apply Radiaplex bid, avoid applying anything to skin within 4 hours of treatment, avoid wearing an under wire bra, and to use an electric razor if they must shave. Pt verbalizes understanding of information given and will contact nursing with any questions or concerns.    Gloriajean Dell. Leonie Green, BSN

## 2021-02-27 ENCOUNTER — Encounter: Payer: Self-pay | Admitting: Radiation Oncology

## 2021-02-28 ENCOUNTER — Other Ambulatory Visit: Payer: Self-pay

## 2021-02-28 ENCOUNTER — Ambulatory Visit
Admission: RE | Admit: 2021-02-28 | Discharge: 2021-02-28 | Disposition: A | Discharge status: Home / Self Care | Payer: Medicare PPO | Source: Ambulatory Visit | Attending: Radiation Oncology | Admitting: Radiation Oncology

## 2021-02-28 DIAGNOSIS — C50411 Malignant neoplasm of upper-outer quadrant of right female breast: Secondary | ICD-10-CM | POA: Diagnosis not present

## 2021-02-28 DIAGNOSIS — Z170 Estrogen receptor positive status [ER+]: Secondary | ICD-10-CM | POA: Diagnosis not present

## 2021-02-28 DIAGNOSIS — D0512 Intraductal carcinoma in situ of left breast: Secondary | ICD-10-CM | POA: Insufficient documentation

## 2021-03-01 ENCOUNTER — Ambulatory Visit
Admission: RE | Admit: 2021-03-01 | Discharge: 2021-03-01 | Disposition: A | Discharge status: Home / Self Care | Payer: Medicare PPO | Source: Ambulatory Visit | Attending: Radiation Oncology | Admitting: Radiation Oncology

## 2021-03-01 DIAGNOSIS — C50411 Malignant neoplasm of upper-outer quadrant of right female breast: Secondary | ICD-10-CM | POA: Diagnosis not present

## 2021-03-01 DIAGNOSIS — Z170 Estrogen receptor positive status [ER+]: Secondary | ICD-10-CM | POA: Diagnosis not present

## 2021-03-01 DIAGNOSIS — D0512 Intraductal carcinoma in situ of left breast: Secondary | ICD-10-CM | POA: Diagnosis not present

## 2021-03-02 ENCOUNTER — Other Ambulatory Visit: Payer: Self-pay

## 2021-03-02 ENCOUNTER — Ambulatory Visit
Admission: RE | Admit: 2021-03-02 | Discharge: 2021-03-02 | Disposition: A | Discharge status: Home / Self Care | Payer: Medicare PPO | Source: Ambulatory Visit | Attending: Radiation Oncology | Admitting: Radiation Oncology

## 2021-03-02 DIAGNOSIS — Z170 Estrogen receptor positive status [ER+]: Secondary | ICD-10-CM | POA: Diagnosis not present

## 2021-03-02 DIAGNOSIS — D0512 Intraductal carcinoma in situ of left breast: Secondary | ICD-10-CM | POA: Diagnosis not present

## 2021-03-02 DIAGNOSIS — C50411 Malignant neoplasm of upper-outer quadrant of right female breast: Secondary | ICD-10-CM | POA: Diagnosis not present

## 2021-03-03 ENCOUNTER — Ambulatory Visit
Admission: RE | Admit: 2021-03-03 | Discharge: 2021-03-03 | Disposition: A | Discharge status: Home / Self Care | Payer: Medicare PPO | Source: Ambulatory Visit | Attending: Radiation Oncology | Admitting: Radiation Oncology

## 2021-03-03 ENCOUNTER — Ambulatory Visit: Payer: Medicare PPO | Admitting: Radiation Oncology

## 2021-03-03 DIAGNOSIS — Z170 Estrogen receptor positive status [ER+]: Secondary | ICD-10-CM | POA: Diagnosis not present

## 2021-03-03 DIAGNOSIS — D0512 Intraductal carcinoma in situ of left breast: Secondary | ICD-10-CM | POA: Diagnosis not present

## 2021-03-03 DIAGNOSIS — C50411 Malignant neoplasm of upper-outer quadrant of right female breast: Secondary | ICD-10-CM | POA: Diagnosis not present

## 2021-03-06 ENCOUNTER — Ambulatory Visit
Admission: RE | Admit: 2021-03-06 | Discharge: 2021-03-06 | Disposition: A | Discharge status: Home / Self Care | Payer: Medicare PPO | Source: Ambulatory Visit | Attending: Radiation Oncology | Admitting: Radiation Oncology

## 2021-03-06 ENCOUNTER — Other Ambulatory Visit: Payer: Self-pay

## 2021-03-06 DIAGNOSIS — D0512 Intraductal carcinoma in situ of left breast: Secondary | ICD-10-CM | POA: Diagnosis not present

## 2021-03-06 DIAGNOSIS — C50411 Malignant neoplasm of upper-outer quadrant of right female breast: Secondary | ICD-10-CM | POA: Diagnosis not present

## 2021-03-06 DIAGNOSIS — Z170 Estrogen receptor positive status [ER+]: Secondary | ICD-10-CM | POA: Diagnosis not present

## 2021-03-07 ENCOUNTER — Ambulatory Visit
Admission: RE | Admit: 2021-03-07 | Discharge: 2021-03-07 | Disposition: A | Discharge status: Home / Self Care | Payer: Medicare PPO | Source: Ambulatory Visit | Attending: Radiation Oncology | Admitting: Radiation Oncology

## 2021-03-07 DIAGNOSIS — Z170 Estrogen receptor positive status [ER+]: Secondary | ICD-10-CM | POA: Diagnosis not present

## 2021-03-07 DIAGNOSIS — D0512 Intraductal carcinoma in situ of left breast: Secondary | ICD-10-CM | POA: Diagnosis not present

## 2021-03-07 DIAGNOSIS — C50411 Malignant neoplasm of upper-outer quadrant of right female breast: Secondary | ICD-10-CM | POA: Diagnosis not present

## 2021-03-08 ENCOUNTER — Ambulatory Visit: Payer: Medicare PPO | Admitting: Cardiovascular Disease

## 2021-03-08 ENCOUNTER — Other Ambulatory Visit: Payer: Self-pay

## 2021-03-08 ENCOUNTER — Ambulatory Visit
Admission: RE | Admit: 2021-03-08 | Discharge: 2021-03-08 | Disposition: A | Discharge status: Home / Self Care | Payer: Medicare PPO | Source: Ambulatory Visit | Attending: Radiation Oncology | Admitting: Radiation Oncology

## 2021-03-08 ENCOUNTER — Encounter: Payer: Self-pay | Admitting: Cardiovascular Disease

## 2021-03-08 VITALS — BP 132/66 | HR 67 | Resp 20 | Ht 64.0 in | Wt 159.2 lb

## 2021-03-08 DIAGNOSIS — E782 Mixed hyperlipidemia: Secondary | ICD-10-CM | POA: Diagnosis not present

## 2021-03-08 DIAGNOSIS — D0512 Intraductal carcinoma in situ of left breast: Secondary | ICD-10-CM | POA: Diagnosis not present

## 2021-03-08 DIAGNOSIS — C50411 Malignant neoplasm of upper-outer quadrant of right female breast: Secondary | ICD-10-CM | POA: Diagnosis not present

## 2021-03-08 DIAGNOSIS — R931 Abnormal findings on diagnostic imaging of heart and coronary circulation: Secondary | ICD-10-CM

## 2021-03-08 DIAGNOSIS — Z170 Estrogen receptor positive status [ER+]: Secondary | ICD-10-CM | POA: Diagnosis not present

## 2021-03-08 NOTE — Assessment & Plan Note (Signed)
History of hyperlipidemia intolerant to statin therapy on Repatha with recent lipid profile performed 08/02/2020 revealing total cholesterol 140, LDL 79 HDL 70.

## 2021-03-08 NOTE — Progress Notes (Signed)
03/08/2021 Michele [name]   1940/08/17  [id number]  Primary Physician Deland Pretty, MD Primary Cardiologist: Lorretta Harp MD [name], [place]  HPI:  Michele [name] is a 81 y.o.  mildly overweight married Caucasian female mother of 2, grandmother 2 grandchildren whose husband [name] is also a patient of mine.  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  I last saw her in the office 01/06/2020. She does have a family history of heart disease with a father who died of a myocardial infarction at age 40.  She has no other risk factors other than hyperlipidemia on low-dose rosuvastatin although she seems to be statin intolerant.  She is very active and does swimming, yoga and works in her yard outside without symptoms.  Recent coronary calcium score ordered by Dr. Shelia Media was 41 with calcium in all 3 coronary arteries although she is completely asymptomatic.   Since I saw her in the office a year ago she continues to do well.  She is very active.  I did a Myoview stress test on her 10/15/2019 which was completely normal.  Her lipid profile is markedly improved on Repatha.   Current Meds  Medication Sig   aspirin EC 81 MG tablet Take 81 mg by mouth daily. Swallow whole.   Cholecalciferol (VITAMIN D3) 1000 units CAPS Take by mouth daily.   Citalopram Hydrobromide (CELEXA PO) Take 10 mg by mouth as needed.   EPINEPHrine (EPIPEN 2-PAK) 0.3 mg/0.3 mL IJ SOAJ injection Inject 0.3 mg into the muscle once as needed (for severe allergic reaction). CAll 911 immediately if you have to use this medicine   Evolocumab (REPATHA SURECLICK) 767 MG/ML SOAJ Inject 140 mg into the skin every 14 (fourteen) days.   fluticasone (VERAMYST) 27.5 MCG/SPRAY nasal spray Place 2 sprays into the nose as needed.   letrozole (FEMARA) 2.5 MG tablet Take 1 tablet (2.5 mg total) by mouth daily.   polyethylene glycol powder (GLYCOLAX/MIRALAX) 17 GM/SCOOP powder Take 17 g  by mouth as needed.     Allergies  Allergen Reactions   Azithromycin Rash   Alpha-Gal Other (See Comments)    Mammalian meat allergy from a [place] tick bite, 02/15/2020 states she has been eating meat for the last year and alpha gal seems to have gone away   Influenza Vaccines Rash    'flu like symptoms for 24 hrs'   Reglan [Metoclopramide] Diarrhea    Social History   Socioeconomic History   Marital status: Married    Spouse name: Not on file   Number of children: 2   Years of education: Not on file   Highest education level: Not on file  Occupational History   Occupation: retired Music therapist  at [employer] Use   Smoking status: Former    Types: Cigarettes    Start date: 07/18/1956    Quit date: 07/19/1982    Years since quitting: 38.6   Smokeless tobacco: Never  Vaping Use   Vaping Use: Never used  Substance and Sexual Activity   Alcohol use: Yes    Alcohol/week: 2.0 standard drinks    Types: 1 Glasses of wine, 1 Shots of liquor per week  Comment: 2 in the eveing before dinner   Drug use: No   Sexual activity: Never  Other Topics Concern   Not on file  Social History Narrative   Married 1 son 1 daughter   Retired Nurse, children's   2 alcoholic beverages daily former smoker no caffeine no drug use no tobacco now   Social Determinants of Radio broadcast assistant Strain: Not on [employer] Insecurity: Not on file  Transportation Needs: Not on file  Physical Activity: Not on file  Stress: Not on file  Social Connections: Not on file  Intimate Partner Violence: Not on file     Review of Systems: General: negative for chills, fever, night sweats or weight changes.  Cardiovascular: negative for chest pain, dyspnea on exertion, edema, orthopnea, palpitations, paroxysmal nocturnal dyspnea or shortness of breath Dermatological: negative for rash Respiratory: negative for cough or  wheezing Urologic: negative for hematuria Abdominal: negative for nausea, vomiting, diarrhea, bright red blood per rectum, melena, or hematemesis Neurologic: negative for visual changes, syncope, or dizziness All other systems reviewed and are otherwise negative except as noted above.    Blood pressure 132/66, pulse 67, resp. rate 20, height 5\' 4"  (1.626 m), weight 159 lb 3.2 oz (72.2 kg), SpO2 97 %.  General appearance: alert and no distress Neck: no adenopathy, no carotid bruit, no JVD, supple, symmetrical, trachea midline, and thyroid not enlarged, symmetric, no tenderness/mass/nodules Lungs: clear to auscultation bilaterally Heart: regular rate and rhythm, S1, S2 normal, no murmur, click, rub or gallop Extremities: extremities normal, atraumatic, no cyanosis or edema Pulses: 2+ and symmetric Skin: Skin color, texture, turgor normal. No rashes or lesions Neurologic: Grossly normal  EKG sinus rhythm at 67 without ST or T wave changes.  Personally reviewed this EKG.  ASSESSMENT AND PLAN:   Hyperlipidemia History of hyperlipidemia intolerant to statin therapy on Repatha with recent lipid profile performed 08/02/2020 revealing total cholesterol 140, LDL 79 HDL 70.  Elevated coronary artery calcium score History of elevated coronary calcium score of 691 with calcium in all 3 coronary arteries.  I performed Myoview stress test on her 10/15/2019 which was entirely normal.  She is completely asymptomatic.     Lorretta Harp MD FACP,FACC,FAHA, [facility] 03/08/2021 3:39 PM

## 2021-03-08 NOTE — Patient Instructions (Signed)

## 2021-03-08 NOTE — Assessment & Plan Note (Signed)
History of elevated coronary calcium score of 691 with calcium in all 3 coronary arteries.  I performed Myoview stress test on her 10/15/2019 which was entirely normal.  She is completely asymptomatic.

## 2021-03-09 ENCOUNTER — Ambulatory Visit
Admission: RE | Admit: 2021-03-09 | Discharge: 2021-03-09 | Disposition: A | Discharge status: Home / Self Care | Payer: Medicare PPO | Source: Ambulatory Visit | Attending: Radiation Oncology | Admitting: Radiation Oncology

## 2021-03-09 DIAGNOSIS — C50411 Malignant neoplasm of upper-outer quadrant of right female breast: Secondary | ICD-10-CM | POA: Diagnosis not present

## 2021-03-09 DIAGNOSIS — Z170 Estrogen receptor positive status [ER+]: Secondary | ICD-10-CM | POA: Diagnosis not present

## 2021-03-09 DIAGNOSIS — D0512 Intraductal carcinoma in situ of left breast: Secondary | ICD-10-CM | POA: Diagnosis not present

## 2021-03-10 ENCOUNTER — Other Ambulatory Visit: Payer: Self-pay

## 2021-03-10 ENCOUNTER — Ambulatory Visit
Admission: RE | Admit: 2021-03-10 | Discharge: 2021-03-10 | Disposition: A | Discharge status: Home / Self Care | Payer: Medicare PPO | Source: Ambulatory Visit | Attending: Radiation Oncology | Admitting: Radiation Oncology

## 2021-03-10 ENCOUNTER — Ambulatory Visit: Payer: Medicare PPO | Admitting: Radiation Oncology

## 2021-03-10 DIAGNOSIS — Z170 Estrogen receptor positive status [ER+]: Secondary | ICD-10-CM | POA: Diagnosis not present

## 2021-03-10 DIAGNOSIS — D0512 Intraductal carcinoma in situ of left breast: Secondary | ICD-10-CM | POA: Diagnosis not present

## 2021-03-10 DIAGNOSIS — C50411 Malignant neoplasm of upper-outer quadrant of right female breast: Secondary | ICD-10-CM | POA: Diagnosis not present

## 2021-03-13 ENCOUNTER — Other Ambulatory Visit: Payer: Self-pay

## 2021-03-13 ENCOUNTER — Ambulatory Visit
Admission: RE | Admit: 2021-03-13 | Discharge: 2021-03-13 | Disposition: A | Discharge status: Home / Self Care | Payer: Medicare PPO | Source: Ambulatory Visit | Attending: Radiation Oncology | Admitting: Radiation Oncology

## 2021-03-13 DIAGNOSIS — D0512 Intraductal carcinoma in situ of left breast: Secondary | ICD-10-CM | POA: Diagnosis not present

## 2021-03-13 DIAGNOSIS — C50411 Malignant neoplasm of upper-outer quadrant of right female breast: Secondary | ICD-10-CM | POA: Diagnosis not present

## 2021-03-13 DIAGNOSIS — Z170 Estrogen receptor positive status [ER+]: Secondary | ICD-10-CM | POA: Diagnosis not present

## 2021-03-14 ENCOUNTER — Other Ambulatory Visit: Payer: Self-pay

## 2021-03-14 ENCOUNTER — Ambulatory Visit
Admission: RE | Admit: 2021-03-14 | Discharge: 2021-03-14 | Disposition: A | Discharge status: Home / Self Care | Payer: Medicare PPO | Source: Ambulatory Visit | Attending: Radiation Oncology | Admitting: Radiation Oncology

## 2021-03-14 ENCOUNTER — Ambulatory Visit: Payer: Medicare PPO

## 2021-03-14 DIAGNOSIS — Z170 Estrogen receptor positive status [ER+]: Secondary | ICD-10-CM | POA: Diagnosis not present

## 2021-03-14 DIAGNOSIS — C50411 Malignant neoplasm of upper-outer quadrant of right female breast: Secondary | ICD-10-CM | POA: Diagnosis not present

## 2021-03-14 DIAGNOSIS — D0512 Intraductal carcinoma in situ of left breast: Secondary | ICD-10-CM | POA: Diagnosis not present

## 2021-03-15 ENCOUNTER — Ambulatory Visit: Payer: Medicare PPO

## 2021-03-16 ENCOUNTER — Ambulatory Visit: Payer: Medicare PPO

## 2021-03-16 ENCOUNTER — Other Ambulatory Visit: Payer: Self-pay

## 2021-03-16 DIAGNOSIS — Z170 Estrogen receptor positive status [ER+]: Secondary | ICD-10-CM | POA: Diagnosis not present

## 2021-03-16 DIAGNOSIS — D0512 Intraductal carcinoma in situ of left breast: Secondary | ICD-10-CM | POA: Diagnosis not present

## 2021-03-16 DIAGNOSIS — C50411 Malignant neoplasm of upper-outer quadrant of right female breast: Secondary | ICD-10-CM | POA: Diagnosis not present

## 2021-03-17 ENCOUNTER — Ambulatory Visit
Admission: RE | Admit: 2021-03-17 | Discharge: 2021-03-17 | Disposition: A | Discharge status: Home / Self Care | Payer: Medicare PPO | Source: Ambulatory Visit | Attending: Radiation Oncology | Admitting: Radiation Oncology

## 2021-03-17 ENCOUNTER — Other Ambulatory Visit: Payer: Self-pay

## 2021-03-17 DIAGNOSIS — D0512 Intraductal carcinoma in situ of left breast: Secondary | ICD-10-CM

## 2021-03-17 DIAGNOSIS — C50411 Malignant neoplasm of upper-outer quadrant of right female breast: Secondary | ICD-10-CM | POA: Diagnosis not present

## 2021-03-17 DIAGNOSIS — Z170 Estrogen receptor positive status [ER+]: Secondary | ICD-10-CM | POA: Diagnosis not present

## 2021-03-17 MED ORDER — RADIAPLEXRX EX GEL: Freq: Once | CUTANEOUS | Status: AC

## 2021-03-20 ENCOUNTER — Ambulatory Visit
Admission: RE | Admit: 2021-03-20 | Discharge: 2021-03-20 | Disposition: A | Discharge status: Home / Self Care | Payer: Medicare PPO | Source: Ambulatory Visit | Attending: Radiation Oncology | Admitting: Radiation Oncology

## 2021-03-20 ENCOUNTER — Other Ambulatory Visit: Payer: Self-pay

## 2021-03-20 DIAGNOSIS — C50411 Malignant neoplasm of upper-outer quadrant of right female breast: Secondary | ICD-10-CM | POA: Diagnosis not present

## 2021-03-20 DIAGNOSIS — D0512 Intraductal carcinoma in situ of left breast: Secondary | ICD-10-CM | POA: Diagnosis not present

## 2021-03-20 DIAGNOSIS — Z170 Estrogen receptor positive status [ER+]: Secondary | ICD-10-CM | POA: Diagnosis not present

## 2021-03-21 ENCOUNTER — Ambulatory Visit: Payer: Medicare PPO

## 2021-03-21 ENCOUNTER — Ambulatory Visit
Admission: RE | Admit: 2021-03-21 | Discharge: 2021-03-21 | Disposition: A | Discharge status: Home / Self Care | Payer: Medicare PPO | Source: Ambulatory Visit | Attending: Radiation Oncology | Admitting: Radiation Oncology

## 2021-03-21 ENCOUNTER — Other Ambulatory Visit: Payer: Self-pay

## 2021-03-21 DIAGNOSIS — Z170 Estrogen receptor positive status [ER+]: Secondary | ICD-10-CM | POA: Diagnosis not present

## 2021-03-21 DIAGNOSIS — C50411 Malignant neoplasm of upper-outer quadrant of right female breast: Secondary | ICD-10-CM | POA: Diagnosis not present

## 2021-03-21 DIAGNOSIS — D0512 Intraductal carcinoma in situ of left breast: Secondary | ICD-10-CM | POA: Diagnosis not present

## 2021-03-21 NOTE — Progress Notes (Signed)
°  ° °                                                                                                                                                          °  Patient Name: Michele [name] MRN: [id number] DOB: Nov 21, 1940 Referring Physician: Erroll Luna (Profile Not Attached) Date of Service: 03/22/2021 [facility], [place]                                                        End Of Treatment Note  Diagnoses: C50.411-Malignant neoplasm of upper-outer quadrant of right female breast  Cancer Staging: Stage IA, cT1aN0M0, grade 2 ER/PR positive invasive ductal carcinoma of the right breast  Intent: Curative  Radiation Treatment Dates: 02/21/2021 through 03/22/2021 Site Technique Total Dose (Gy) Dose per Fx (Gy) Completed Fx Beam Energies  Breast, Right: Breast_R 3D 42.56/42.56 2.66 16/16 6XFFF  Breast, Right: Breast_R_Bst 3D 8/8 2 4/4 6X   Narrative: The patient tolerated radiation therapy relatively well. She developed fatigue and anticipated skin changes in the treatment field.  Plan: The patient will receive a call in about one month from the radiation oncology department. She will continue follow up with Dr. Lindi Adie as well. With her prior history of high grade ER/PR negative DCIS of the left breast. She will be followed closely in surveillance as well after this treatment.  ________________________________________________    Carola Rhine, PAC

## 2021-03-22 ENCOUNTER — Ambulatory Visit
Admission: RE | Admit: 2021-03-22 | Discharge: 2021-03-22 | Disposition: A | Discharge status: Home / Self Care | Payer: Medicare PPO | Source: Ambulatory Visit | Attending: Radiation Oncology | Admitting: Radiation Oncology

## 2021-03-22 ENCOUNTER — Other Ambulatory Visit: Payer: Self-pay

## 2021-03-22 ENCOUNTER — Encounter: Payer: Self-pay | Admitting: Radiation Oncology

## 2021-03-22 DIAGNOSIS — D0512 Intraductal carcinoma in situ of left breast: Secondary | ICD-10-CM | POA: Diagnosis not present

## 2021-03-23 ENCOUNTER — Ambulatory Visit: Payer: Medicare PPO | Admitting: Hematology and Oncology

## 2021-03-23 NOTE — Progress Notes (Signed)
°  Radiation Oncology         [phone] ________________________________  Name: Michele [name] MRN: [id number]  Date: 02/27/2021  DOB: [birth year]  SIMULATION NOTE   NARRATIVE:  The patient underwent simulation today for ongoing radiation therapy.  The existing CT study set was employed for the purpose of virtual treatment planning.  The target and avoidance structures were reviewed and modified as necessary.  Treatment planning then occurred.  The radiation boost prescription was entered and confirmed.  A total of 3 complex treatment devices were fabricated in the form of multi-leaf collimators to shape radiation around the targets while maximally excluding nearby normal structures. I have requested : Isodose Plan.    PLAN:  This modified radiation beam arrangement is intended to continue the current radiation dose to an additional 8 Gy in 4 fractions for a total cumulative dose of 50.56 Gy.    ------------------------------------------------  Jodelle Gross, MD, PhD

## 2021-04-06 ENCOUNTER — Telehealth: Payer: Self-pay | Admitting: *Deleted

## 2021-04-06 NOTE — Telephone Encounter (Signed)
Received call from pt with complaint of increase in stiffness since starting letrozole.  Pt states she is very active and this side effect continues to interferer with ADL's.  Per MD pt to stop letrozole x2 weeks and to f/u in the office.  Appt scheduled and pt verbalized understanding.

## 2021-04-19 NOTE — Progress Notes (Signed)
Patient Care Team: Deland Pretty, MD as PCP - General (Internal Medicine) [name], MD as Consulting Physician (General Surgery) Nicholas Lose, MD as Consulting Physician (Hematology and Oncology) Kyung Rudd, MD as Consulting Physician (Radiation Oncology) Gardenia Phlegm, NP as Nurse Practitioner (Hematology and Oncology)  DIAGNOSIS:    ICD-10-CM   1. Malignant neoplasm of upper-outer quadrant of right breast in female, estrogen receptor positive ([place])  C50.411    Z17.0       SUMMARY OF ONCOLOGIC HISTORY: Oncology History  Ductal carcinoma in situ (DCIS) of left breast  09/30/2017 Initial Diagnosis   Screening detected left breast calcifications 2 groups upper outer quadrant 1.5 cm and 1.2 cm both of which are biopsy-proven high-grade DCIS with calcifications ER PR negative.  Underneath the nipple 2 groups of calcifications.  Posterior group was biopsy-proven fibrocystic change anterior group could not be biopsied.  Tis NX stage 0   11/08/2017 Surgery   2 left lumpectomies: DCIS high-grade 0.9 cm focally less than 0.1 cm to medial margin; second lumpectomy left subareolar: DCIS with calcifications high-grade 1.2 cm focally less than 0.1 cm superior margin and focally,  0.1 cm to anterior margin ER 0%, PR 0% for both Tis NX stage 0   12/18/2017 - 01/13/2018 Radiation Therapy   Adjuvant radiation therapy   01/03/2021 Genetic Testing   Negative hereditary cancer genetic testing: no pathogenic variants detected in [facility]Can[facility] +RNAinsight Panel.  The report date is January 03, 2021.   The CustomNext-Cancer+RNAinsight panel offered by [facility] includes sequencing and rearrangement analysis for the following 47 genes:  APC, ATM, AXIN2, BARD1, BMPR1A, BRCA1, BRCA2, BRIP1, CDH1, CDK4, CDKN2A, CHEK2, DICER1, EPCAM, GREM1, HOXB13, MEN1, MLH1, MSH2, MSH3, MSH6, MUTYH, NBN, NF1, NF2, NTHL1, PALB2, PMS2, POLD1, POLE, PTEN, RAD51C, RAD51D, RECQL, RET, SDHA, SDHAF2,  SDHB, SDHC, SDHD, SMAD4, SMARCA4, STK11, TP53, TSC1, TSC2, and VHL.  RNA data is routinely analyzed for use in variant interpretation for all genes.   Malignant neoplasm of upper-outer quadrant of right breast in female, estrogen receptor positive ([place])  11/07/2020 Pathology Results   Screening mammogram detected right breast abnormality: Right breast biopsy 9 o'clock position: Grade 1 through 2 IDC ER 100%, PR 95%, Ki-67 5%, HER2 2+ IHC, FISH negative ratio 1.5   12/08/2020 Cancer Staging   Staging form: Breast, AJCC 8th Edition - Clinical stage from 12/08/2020: Stage IA (cT1a, cN0, cM0, G2, ER+, PR+, HER2-) - Signed by Nicholas Lose, MD on 01/12/2021 Stage prefix: Initial diagnosis Method of lymph node assessment: Clinical Histologic grading system: 3 grade system    01/03/2021 Genetic Testing   Negative hereditary cancer genetic testing: no pathogenic variants detected in [facility]Cancer +RNAinsight Panel.  The report date is January 03, 2021.   The CustomNext-Cancer+RNAinsight panel offered by [facility] includes sequencing and rearrangement analysis for the following 47 genes:  APC, ATM, AXIN2, BARD1, BMPR1A, BRCA1, BRCA2, BRIP1, CDH1, CDK4, CDKN2A, CHEK2, DICER1, EPCAM, GREM1, HOXB13, MEN1, MLH1, MSH2, MSH3, MSH6, MUTYH, NBN, NF1, NF2, NTHL1, PALB2, PMS2, POLD1, POLE, PTEN, RAD51C, RAD51D, RECQL, RET, SDHA, SDHAF2, SDHB, SDHC, SDHD, SMAD4, SMARCA4, STK11, TP53, TSC1, TSC2, and VHL.  RNA data is routinely analyzed for use in variant interpretation for all genes.   01/03/2021 Surgery   Right lumpectomy: Grade 2 IDC 1.1 cm, intermediate grade DCIS, LCIS, margins negative, ER 100%, PR 95%, HER2 negative, Ki-67 5%     CHIEF COMPLIANT: Follow-up of left breast DCIS  INTERVAL HISTORY: Michele [name] is a 81 y.o.  with above-mentioned history of left breast DCIS treated with two lumpectomies, radiation, and is currently on surveillance. She presents to the clinic today for follow-up.   After 3 weeks on letrozole she developed severe joint aches and stiffness and she stopped letrozole and the symptoms went away.  Since then she has not resumed letrozole.  She denies any lumps or nodules in the breast.  ALLERGIES:  is allergic to azithromycin, alpha-gal, influenza vaccines, and reglan [metoclopramide].  MEDICATIONS:  Current Outpatient Medications  Medication Sig Dispense Refill   aspirin EC 81 MG tablet Take 81 mg by mouth daily. Swallow whole.     Cholecalciferol (VITAMIN D3) 1000 units CAPS Take by mouth daily.     Citalopram Hydrobromide (CELEXA PO) Take 10 mg by mouth as needed.     EPINEPHrine (EPIPEN 2-PAK) 0.3 mg/0.3 mL IJ SOAJ injection Inject 0.3 mg into the muscle once as needed (for severe allergic reaction). CAll 911 immediately if you have to use this medicine 2 each 0   Evolocumab (REPATHA SURECLICK) 786 MG/ML SOAJ Inject 140 mg into the skin every 14 (fourteen) days. 6 mL 3   fluticasone (VERAMYST) 27.5 MCG/SPRAY nasal spray Place 2 sprays into the nose as needed.     polyethylene glycol powder (GLYCOLAX/MIRALAX) 17 GM/SCOOP powder Take 17 g by mouth as needed.     letrozole (FEMARA) 2.5 MG tablet Take 1 tablet (2.5 mg total) by mouth daily. (Patient not taking: Reported on 04/20/2021) 90 tablet 3   No current facility-administered medications for this visit.    PHYSICAL EXAMINATION: ECOG PERFORMANCE STATUS: 1 - Symptomatic but completely ambulatory  Vitals:   04/20/21 1336  BP: 134/70  Pulse: 94  Resp: 18  Temp: 97.6 F (36.4 C)  SpO2: 96%   Filed Weights   04/20/21 1336  Weight: 159 lb (72.1 kg)      LABORATORY DATA:  I have reviewed the data as listed CMP Latest Ref Rng & Units 01/03/2021 10/09/2017 11/17/2014  Glucose 70 - 99 mg/dL 113(H) 113(H) 94  BUN 8 - 23 mg/dL _0 Creatinine 0.44 - 1.00 mg/dL 0.83 0.88 1.00  Sodium 135 - 145 mmol/L 140 141 139  Potassium 3.5 - 5.1 mmol/L 3.9 4.7 4.9  Chloride 98 - 111 mmol/L 104 102 97  CO2 22  - 32 mmol/L 28 28 -  Calcium 8.9 - 10.3 mg/dL 9.2 9.2 9.6  Total Protein 6.5 - 8.1 g/dL 7.1 7.4 6.9  Total Bilirubin 0.3 - 1.2 mg/dL 0.9 0.6 0.5  Alkaline Phos 38 - 126 U/L 44 55 51  AST 15 - 41 U/L _1 ALT 0 - 44 U/L 12 10 -    Lab Results  Component Value Date   WBC 4.8 01/03/2021   HGB 15.2 (H) 01/03/2021   HCT 46.5 (H) 01/03/2021   MCV 96.9 01/03/2021   PLT 224 01/03/2021   NEUTROABS 2.6 01/03/2021    ASSESSMENT & PLAN:  Malignant neoplasm of upper-outer quadrant of right breast in female, estrogen receptor positive ([place]) 01/03/2021:Right lumpectomy: Grade 2 IDC 1.1 cm, intermediate grade DCIS, LCIS, margins negative, ER 100%, PR 95%, HER2 negative, Ki-67 5%   (11/08/2017:Two left lumpectomies: DCIS high-grade 0.9 cm focally less than 0.1 cm to medial margin; second lumpectomy left subareolar: DCIS with calcifications high-grade 1.2 cm focally less than 0.1 cm superior margin and focally,  0.1 cm to anterior margin ER 0%, PR 0% for both Tis NX stage 0 Adjuvant radiation therapy completed  01/13/2018)   Treatment plan:  XRT started 02/22/2021 2. Adjuvant antiestrogen therapy with letrozole 2.5 mg daily x5 years started November 2022 discontinued January 2023   Letrozole toxicities: Severe joint stiffness and achiness.  This resolved after she stopped it. She does not want to take it any further. I ordered her mammograms to be done 10/27/2021.  Return to clinic in 1 year    No orders of the defined types were placed in this encounter.  The patient has a good understanding of the overall plan. she agrees with it. she will call with any problems that may develop before the next visit here.  Total time spent: 20 mins including face to face time and time spent for planning, charting and coordination of care  Rulon Eisenmenger, MD, MPH 04/20/2021  I, Thana Ates, am acting as scribe for Dr. Nicholas Lose.  I have reviewed the above documentation for accuracy and  completeness, and I agree with the above.

## 2021-04-19 NOTE — Assessment & Plan Note (Signed)
01/03/2021:Right lumpectomy: Grade 2 IDC 1.1 cm, intermediate grade DCIS, LCIS, margins negative, ER 100%, PR 95%, HER2 negative, Ki-67 5%  (11/08/2017:Twoleft lumpectomies: DCIS high-grade 0.9 cm focally less than 0.1 cm to medial margin; second lumpectomy left subareolar: DCIS with calcifications high-grade 1.2 cm focally less than 0.1 cm superior margin and focally, 0.1 cm to anterior margin ER 0%, PR 0% for both Tis NX stage 0 Adjuvant radiation therapy completed 01/13/2018)  Treatment plan: 1. XRT started 02/22/2021 2.Adjuvant antiestrogen therapy with letrozole 2.5 mg daily x5 years started November 2022   Letrozole toxicities: Tolerating it extremely well without any problems or concerns. Mild joint stiffness especially in the shoulders.  Return to clinic in 1 year

## 2021-04-20 ENCOUNTER — Encounter: Payer: Self-pay | Admitting: Hematology and Oncology

## 2021-04-20 ENCOUNTER — Other Ambulatory Visit: Payer: Self-pay

## 2021-04-20 ENCOUNTER — Inpatient Hospital Stay: Payer: Medicare PPO | Attending: Hematology and Oncology | Admitting: Hematology and Oncology

## 2021-04-20 ENCOUNTER — Encounter (HOSPITAL_COMMUNITY): Payer: Self-pay

## 2021-04-20 DIAGNOSIS — Z170 Estrogen receptor positive status [ER+]: Secondary | ICD-10-CM | POA: Diagnosis not present

## 2021-04-20 DIAGNOSIS — C50411 Malignant neoplasm of upper-outer quadrant of right female breast: Secondary | ICD-10-CM | POA: Diagnosis not present

## 2021-04-21 ENCOUNTER — Telehealth: Payer: Self-pay | Admitting: Genetic Counselor

## 2021-04-21 NOTE — Telephone Encounter (Signed)
Received message from Cephus Shelling that patient's OOP cost for genetic testing is less than $100.  LVM for patient informing her of this.

## 2021-04-24 ENCOUNTER — Ambulatory Visit
Admission: RE | Admit: 2021-04-24 | Discharge: 2021-04-24 | Disposition: A | Discharge status: Home / Self Care | Payer: Medicare PPO | Source: Ambulatory Visit | Attending: Radiation Oncology | Admitting: Radiation Oncology

## 2021-04-24 DIAGNOSIS — D0512 Intraductal carcinoma in situ of left breast: Secondary | ICD-10-CM

## 2021-04-24 NOTE — Progress Notes (Signed)
°  Radiation Oncology         [phone] ________________________________  Name: Michele [name] MRN: [id number]  Date of Service: 04/24/2021  DOB: 1941-01-14  Post Treatment Telephone Note  Diagnosis:   Stage IA, cT1aN0M0, grade 2 ER/PR positive invasive ductal carcinoma of the right breast  Intent: Curative  Radiation Treatment Dates: 02/21/2021 through 03/22/2021 Site Technique Total Dose (Gy) Dose per Fx (Gy) Completed Fx Beam Energies  Breast, Right: Breast_R 3D 42.56/42.56 2.66 16/16 6XFFF  Breast, Right: Breast_R_Bst 3D 8/8 2 4/4 6X   Narrative: The patient tolerated radiation therapy relatively well. She developed fatigue and anticipated skin changes in the treatment field. She reports she's been doing well overall and her skin has improved since we finished therapy.   Impression/Plan: 1. Stage IA, cT1aN0M0, grade 2 ER/PR positive invasive ductal carcinoma of the right breast. The patient has been doing well since completion of radiotherapy. We discussed that we would be happy to continue to follow her as needed, but she will also continue to follow up with Dr. Lindi Adie in medical oncology. She was counseled on skin care as well as measures to avoid sun exposure to this area.  2. Survivorship. We discussed the importance of survivorship evaluation and encouraged her to attend her upcoming visit with that clinic.      Carola Rhine, PAC

## 2021-05-04 ENCOUNTER — Telehealth: Payer: Self-pay | Admitting: *Deleted

## 2021-05-04 NOTE — Telephone Encounter (Signed)
Received call from pt stating she receives Prolia for Osteoporosis with her PCP and wanting advice from MD if okay to proceed with hx of radiation tx.  Per MD okay for pt to proceed with treatment, pt verbalized understanding.  ?

## 2021-05-16 ENCOUNTER — Telehealth: Payer: Self-pay | Admitting: Adult Health

## 2021-05-16 NOTE — Telephone Encounter (Signed)
Rescheduled appointment per provider request. Patient is aware of the changes made to her appointment. At the end of the call with scheduler, patient was transferred to the nurse in regards to the [facility] appointment. ?

## 2021-05-19 ENCOUNTER — Encounter: Payer: Medicare PPO | Admitting: Adult Health

## 2021-05-24 NOTE — Progress Notes (Deleted)
? ?CLINIC:  ?Survivorship  ? ?Patient Care Team: ?Michele Pretty, Michele [name] as PCP - General (Internal Medicine) ?Michele Lose, Michele [name] as Consulting Physician (Hematology and Oncology) ?Michele Rudd, Michele [name] as Consulting Physician (Radiation Oncology) ?Michele Phlegm, Michele [name] as Nurse Practitioner (Hematology and Oncology) ?Michele Luna, Michele [name] as Consulting Physician (General Surgery) ?Michele Feeling, Michele [name] as Nurse Practitioner (Nurse Practitioner)  ? ?REASON FOR VISIT:  ?Routine follow-up post-treatment for a recent history of breast cancer. ? ?BRIEF ONCOLOGIC HISTORY:  ?Oncology History  ?Ductal carcinoma in situ (DCIS) of left breast  ?09/30/2017 Initial Diagnosis  ? Screening detected left breast calcifications 2 groups upper outer quadrant 1.5 cm and 1.2 cm both of which are biopsy-proven high-grade DCIS with calcifications ER PR negative.  Underneath the nipple 2 groups of calcifications.  Posterior group was biopsy-proven fibrocystic change anterior group could not be biopsied.  Tis NX [name] 0 ?  ?11/08/2017 Surgery  ? 2 left lumpectomies: DCIS high-grade 0.9 cm focally less than 0.1 cm to medial margin; second lumpectomy left subareolar: DCIS with calcifications high-grade 1.2 cm focally less than 0.1 cm superior margin and focally,  0.1 cm to anterior margin ER 0%, PR 0% for both Tis NX [name] 0 ?  ?12/18/2017 - 01/13/2018 Radiation Therapy  ? Adjuvant radiation therapy ?  ?01/03/2021 Genetic Testing  ? Negative hereditary cancer genetic testing: no pathogenic variants detected in [facility]Cancer +RNAinsight Panel.  The report date is January 03, 2021.  ? ?The CustomNext-Cancer+RNAinsight panel offered by [facility] includes sequencing and rearrangement analysis for the following 47 genes:  APC, ATM, AXIN2, BARD1, BMPR1A, BRCA1, BRCA2, BRIP1, CDH1, CDK4, CDKN2A, CHEK2, DICER1, EPCAM, GREM1, HOXB13, MEN1, MLH1, MSH2, MSH3, MSH6, MUTYH, NBN, NF1, NF2, NTHL1, PALB2, PMS2, POLD1, POLE, PTEN, RAD51C, RAD51D, RECQL,  RET, SDHA, SDHAF2, SDHB, SDHC, SDHD, SMAD4, SMARCA4, STK11, TP53, TSC1, TSC2, and VHL.  RNA data is routinely analyzed for use in variant interpretation for all genes. ?  ?Malignant neoplasm of upper-outer quadrant of right breast in female, estrogen receptor positive ([place])  ?11/07/2020 Pathology Results  ? Screening mammogram detected right breast abnormality: Right breast biopsy 9 o'clock position: Grade 1 through 2 IDC ER 100%, PR 95%, Ki-67 5%, HER2 2+ IHC, FISH negative ratio 1.5 ?  ?12/08/2020 Cancer Staging  ? Staging form: Breast, AJCC 8th Edition ?- Clinical [name] from 12/08/2020: [name] (cT1a, cN0, cM0, G2, ER+, PR+, HER2-) - Signed by Michele Lose, Michele [name] on 01/12/2021 ?[name] prefix: Initial diagnosis ?Method of lymph node assessment: Clinical ?Histologic grading system: 3 grade system ? ?  ?01/03/2021 Genetic Testing  ? Negative hereditary cancer genetic testing: no pathogenic variants detected in [facility]Cancer +RNAinsight Panel.  The report date is January 03, 2021.  ? ?The CustomNext-Cancer+RNAinsight panel offered by [facility] includes sequencing and rearrangement analysis for the following 47 genes:  APC, ATM, AXIN2, BARD1, BMPR1A, BRCA1, BRCA2, BRIP1, CDH1, CDK4, CDKN2A, CHEK2, DICER1, EPCAM, GREM1, HOXB13, MEN1, MLH1, MSH2, MSH3, MSH6, MUTYH, NBN, NF1, NF2, NTHL1, PALB2, PMS2, POLD1, POLE, PTEN, RAD51C, RAD51D, RECQL, RET, SDHA, SDHAF2, SDHB, SDHC, SDHD, SMAD4, SMARCA4, STK11, TP53, TSC1, TSC2, and VHL.  RNA data is routinely analyzed for use in variant interpretation for all genes. ?  ?01/03/2021 Surgery  ? Right lumpectomy: Grade 2 IDC 1.1 cm, intermediate grade DCIS, LCIS, margins negative, ER 100%, PR 95%, HER2 negative, Ki-67 5% ?  ? ? ?INTERVAL HISTORY:  ?Michele [name] presents to the [facility] today for our initial meeting to review her survivorship care plan  detailing her treatment course for breast cancer, as well as monitoring long-term side effects of that treatment,  education regarding health maintenance, screening, and overall wellness and health promotion.    ? ?Overall, Michele [name] reports [name] quite well since completing her radiation therapy approximately 3 months ago.  She *** ? ? ? ?REVIEW OF SYSTEMS:  ?Review of Systems - Oncology ?Breast: Denies any new nodularity, masses, tenderness, nipple changes, or nipple discharge.  ? ? ? ? ?ONCOLOGY TREATMENT TEAM:  ?1. Surgeon:  Dr. Brantley [name] at [facility] ?2. Medical Oncologist: Dr. Lindi [name]  ?3. Radiation Oncologist: Dr. Lisbeth [name] ?  ? ?PAST MEDICAL/SURGICAL HISTORY:  ?Past Medical History:  ?Diagnosis Date  ? Allergic rhinitis   ? Allergy to alpha-gal   ? Anal fissure   ? Anxiety   ? Asthma   ? only as a child; went away by age [age]  ? Breast cancer ([place])   ? Calcified granuloma of lung ([place])   ? Cancer of the skin, basal cell   ? Depression   ? Family history of breast cancer 12/19/2020  ? Hyperglycemia   ? Hyperlipemia   ? Hyperlipidemia   ? IBS (irritable bowel syndrome)   ? Nicotine dependence   ? Osteopenia   ? Personal history of radiation therapy   ? Seasonal affective disorder ([place])   ? Seasonal allergies   ? Status post dilation of esophageal narrowing   ? ?Past Surgical History:  ?Procedure Laterality Date  ? BREAST BIOPSY Left 08/2017  ? BREAST LUMPECTOMY Left 10/2017  ? BREAST LUMPECTOMY WITH NEEDLE LOCALIZATION Left 11/08/2017  ? Procedure: LEFT BREAST SUBAREOLAR BIOPSY WITH NEEDLE LOCALIZATION;  Surgeon: Michele Skates, Michele [name];  Location: [place];  Service: General;  Laterality: Left;  ? BREAST LUMPECTOMY WITH RADIOACTIVE SEED LOCALIZATION Left 11/08/2017  ? Procedure: LEFT BREAST LUMPECTOMY WITH RADIOACTIVE SEED LOCALIZATION X 2;  Surgeon: Michele Skates, Michele [name];  Location: [place];  Service: General;  Laterality: Left;  ? BREAST LUMPECTOMY WITH RADIOACTIVE SEED LOCALIZATION Right 01/03/2021  ? Procedure: RIGHT BREAST LUMPECTOMY WITH RADIOACTIVE SEED LOCALIZATION;  Surgeon:  Michele Luna, Michele [name];  Location: [place];  Service: General;  Laterality: Right;  ? BROW LIFT    ? CATARACT EXTRACTION Bilateral   ? COLONOSCOPY  2010  ? Incomplete PE negative redundant colon  ? TONSILLECTOMY    ? age 41  ? ? ? ?ALLERGIES:  ?Allergies  ?Allergen Reactions  ? Azithromycin Rash  ? Alpha-Gal Other (See Comments)  ?  Mammalian meat allergy from a [place] tick bite, 02/15/2020 states she has been eating meat for the last year and alpha gal seems to have gone away  ? Influenza Vaccines Rash  ?  'flu like symptoms for 24 hrs'  ? Reglan [Metoclopramide] Diarrhea  ? ? ? ?CURRENT MEDICATIONS:  ?Outpatient Encounter Medications as of 05/25/2021  ?Medication Sig  ? aspirin EC 81 MG tablet Take 81 mg by mouth daily. Swallow whole.  ? Cholecalciferol (VITAMIN D3) 1000 units CAPS Take by mouth daily.  ? Citalopram Hydrobromide (CELEXA PO) Take 10 mg by mouth as needed.  ? EPINEPHrine (EPIPEN 2-PAK) 0.3 mg/0.3 mL IJ SOAJ injection Inject 0.3 mg into the muscle once as needed (for severe allergic reaction). CAll 911 immediately if you have to use this medicine  ? Evolocumab (REPATHA SURECLICK) 229 MG/ML SOAJ Inject 140 mg into the skin every 14 (fourteen) days.  ? fluticasone (VERAMYST) 27.5 MCG/SPRAY nasal spray Place 2  sprays into the nose as needed.  ? ?No facility-administered encounter medications on file as of 05/25/2021.  ? ? ? ?ONCOLOGIC FAMILY HISTORY:  ?Family History  ?Problem Relation Age of Onset  ? Asthma Mother   ? COPD Mother   ? Heart attack Father   ? Cancer Sister   ?     maternal half sister; type unknown; thigh?  ? Breast cancer Sister   ?     maternal half sister; two primaries; mid-late 85s  ? COPD Sister   ? Thyroid disease Daughter   ? Breast cancer Niece   ?     dx before 71  ? ? ? ?GENETIC COUNSELING/TESTING: ?*** ? ?SOCIAL HISTORY:  ?GAYLYN BERISH is /single/married/divorced/widowed/separated and lives alone/with her spouse/family/friend in (city), [place].  She  has (#) children and they live in (city).  Ms. Cadenas is currently retired/disabled/working part-time/full-time as ***.  She denies any current or history of tobacco, alcohol, or illicit drug use.   ? ? ?[place]

## 2021-05-25 ENCOUNTER — Encounter: Payer: Medicare PPO | Admitting: Nurse Practitioner

## 2021-06-06 DIAGNOSIS — M810 Age-related osteoporosis without current pathological fracture: Secondary | ICD-10-CM | POA: Diagnosis not present

## 2021-08-10 DIAGNOSIS — E785 Hyperlipidemia, unspecified: Secondary | ICD-10-CM | POA: Diagnosis not present

## 2021-08-10 DIAGNOSIS — R7309 Other abnormal glucose: Secondary | ICD-10-CM | POA: Diagnosis not present

## 2021-08-14 DIAGNOSIS — Z23 Encounter for immunization: Secondary | ICD-10-CM | POA: Diagnosis not present

## 2021-08-14 DIAGNOSIS — I2510 Atherosclerotic heart disease of native coronary artery without angina pectoris: Secondary | ICD-10-CM | POA: Diagnosis not present

## 2021-08-14 DIAGNOSIS — R002 Palpitations: Secondary | ICD-10-CM | POA: Diagnosis not present

## 2021-08-14 DIAGNOSIS — J309 Allergic rhinitis, unspecified: Secondary | ICD-10-CM | POA: Diagnosis not present

## 2021-08-14 DIAGNOSIS — Z170 Estrogen receptor positive status [ER+]: Secondary | ICD-10-CM | POA: Diagnosis not present

## 2021-08-14 DIAGNOSIS — Z0000 Encounter for general adult medical examination without abnormal findings: Secondary | ICD-10-CM | POA: Diagnosis not present

## 2021-08-14 DIAGNOSIS — C50411 Malignant neoplasm of upper-outer quadrant of right female breast: Secondary | ICD-10-CM | POA: Diagnosis not present

## 2021-08-14 DIAGNOSIS — M810 Age-related osteoporosis without current pathological fracture: Secondary | ICD-10-CM | POA: Diagnosis not present

## 2021-08-14 DIAGNOSIS — F339 Major depressive disorder, recurrent, unspecified: Secondary | ICD-10-CM | POA: Diagnosis not present

## 2021-08-15 DIAGNOSIS — H02413 Mechanical ptosis of bilateral eyelids: Secondary | ICD-10-CM | POA: Diagnosis not present

## 2021-08-15 DIAGNOSIS — H0279 Other degenerative disorders of eyelid and periocular area: Secondary | ICD-10-CM | POA: Diagnosis not present

## 2021-08-15 DIAGNOSIS — H53483 Generalized contraction of visual field, bilateral: Secondary | ICD-10-CM | POA: Diagnosis not present

## 2021-08-15 DIAGNOSIS — H57813 Brow ptosis, bilateral: Secondary | ICD-10-CM | POA: Diagnosis not present

## 2021-08-15 DIAGNOSIS — H02831 Dermatochalasis of right upper eyelid: Secondary | ICD-10-CM | POA: Diagnosis not present

## 2021-08-15 DIAGNOSIS — H02423 Myogenic ptosis of bilateral eyelids: Secondary | ICD-10-CM | POA: Diagnosis not present

## 2021-08-15 DIAGNOSIS — H02834 Dermatochalasis of left upper eyelid: Secondary | ICD-10-CM | POA: Diagnosis not present

## 2021-08-23 DIAGNOSIS — H53483 Generalized contraction of visual field, bilateral: Secondary | ICD-10-CM | POA: Diagnosis not present

## 2021-09-12 ENCOUNTER — Ambulatory Visit: Payer: Self-pay

## 2021-09-12 ENCOUNTER — Ambulatory Visit (INDEPENDENT_AMBULATORY_CARE_PROVIDER_SITE_OTHER): Payer: Medicare PPO

## 2021-09-12 ENCOUNTER — Ambulatory Visit: Payer: Medicare PPO | Admitting: Family Medicine

## 2021-09-12 VITALS — BP 138/86 | HR 82 | Ht 64.0 in | Wt 159.6 lb

## 2021-09-12 DIAGNOSIS — G8929 Other chronic pain: Secondary | ICD-10-CM

## 2021-09-12 DIAGNOSIS — M25562 Pain in left knee: Secondary | ICD-10-CM

## 2021-09-12 NOTE — Patient Instructions (Addendum)
Thank you for coming in today.   Please get an Xray today before you leave   You received an injection today. Seek immediate medical attention if the joint becomes red, extremely painful, or is oozing fluid.   Please use Voltaren gel (Generic Diclofenac Gel) up to 4x daily for pain as needed.  This is available over-the-counter as both the name brand Voltaren gel and the generic diclofenac gel.   Check back as needed 

## 2021-09-12 NOTE — Progress Notes (Signed)
   I, Peterson Lombard, LAT, ATC acting as a scribe for Lynne Leader, MD.  Subjective:    CC: L knee pain  HPI: Pt is an 81 y/o female c/o L knee pain x 6 weeks w/ no MOI. Pt locates pain to the anterior-medal aspect of the L knee.   L Knee swelling: no- has resolved Mechanical symptoms: yes Aggravates: worse at night Treatments tried: IBU,   Pertinent review of Systems: No fevers or chills  Relevant historical information: DCIS history   Objective:    Vitals:   09/12/21 1459  BP: 138/86  Pulse: 82  SpO2: 97%   General: Well Developed, well nourished, and in no acute distress.   MSK: Left knee: Normal. Normal motion with crepitation.  Tender palpation medial joint line. Stable ligamentous exam. Intact strength.   Lab and Radiology Results  Procedure: Real-time Ultrasound Guided Injection of left knee superior patellar space Device: Philips Affiniti 50G Images permanently stored and available for review in PACS Ultrasound evaluation prior to injection reveals medial compartment degeneration with partial extruded medial meniscus. Verbal informed consent obtained.  Discussed risks and benefits of procedure. Warned about infection, bleeding, hyperglycemia damage to structures among others. Patient expresses understanding and agreement Time-out conducted.   Noted no overlying erythema, induration, or other signs of local infection.   Skin prepped in a sterile fashion.   Local anesthesia: Topical Ethyl chloride.   With sterile technique and under real time ultrasound guidance: 40 mg of Kenalog and 2 mL of Marcaine injected into knee joint. Fluid seen entering the joint capsule.   Completed without difficulty   Pain immediately resolved suggesting accurate placement of the medication.   Advised to call if fevers/chills, erythema, induration, drainage, or persistent bleeding.   Images permanently stored and available for review in the ultrasound unit.  Impression:  Technically successful ultrasound guided injection.  X-ray images left knee obtained today personally and independently interpreted Moderate medial compartment DJD.  Mild to moderate patellofemoral DJD. Await formal radiology review    Impression and Recommendations:    Assessment and Plan: 81 y.o. female with left knee pain due to DJD.  Plan for steroid injection today.  Recheck back as needed.Marland Kitchen  PDMP not reviewed this encounter. Orders Placed This Encounter  Procedures   [place] LIMITED JOINT SPACE STRUCTURES LOW LEFT(NO LINKED CHARGES)    Order Specific Question:   Reason for Exam (SYMPTOM  OR DIAGNOSIS REQUIRED)    Answer:   left knee pain    Order Specific Question:   Preferred imaging location?    Answer:   [facility]   DG Knee AP/LAT W/Sunrise Left    Standing Status:   Future    Number of Occurrences:   1    Standing Expiration Date:   10/13/2021    Order Specific Question:   Reason for Exam (SYMPTOM  OR DIAGNOSIS REQUIRED)    Answer:   left knee pain    Order Specific Question:   Preferred imaging location?    Answer:   [facility]   No orders of the defined types were placed in this encounter.   Discussed warning signs or symptoms. Please see discharge instructions. Patient expresses understanding.   The above documentation has been reviewed and is accurate and complete Lynne Leader, M.D.

## 2021-09-13 NOTE — Progress Notes (Signed)
Left knee shows medium knee arthritis

## 2021-10-11 ENCOUNTER — Telehealth: Payer: Self-pay | Admitting: Cardiovascular Disease

## 2021-10-11 MED ORDER — REPATHA SURECLICK 140 MG/ML ~~LOC~~ SOAJ: 140.0000 mg | SUBCUTANEOUS | 3 refills | Status: DC

## 2021-10-11 NOTE — Telephone Encounter (Signed)
Refill sent in

## 2021-10-11 NOTE — Telephone Encounter (Signed)
*  STAT* If patient is at the pharmacy, call can be transferred to refill team.   1. Which medications need to be refilled? (please list name of each medication and dose if known) Repatha   2. Which pharmacy/location (including street and city if local pharmacy) is medication to be sent to? [facility]  3. Do they need a 30 day or 90 day supply? 90 days and refills

## 2021-10-23 DIAGNOSIS — H40013 Open angle with borderline findings, low risk, bilateral: Secondary | ICD-10-CM | POA: Diagnosis not present

## 2021-10-24 ENCOUNTER — Ambulatory Visit
Admission: RE | Admit: 2021-10-24 | Discharge: 2021-10-24 | Disposition: A | Discharge status: Home / Self Care | Payer: Medicare PPO | Source: Ambulatory Visit | Attending: Hematology and Oncology | Admitting: Hematology and Oncology

## 2021-10-24 DIAGNOSIS — R928 Other abnormal and inconclusive findings on diagnostic imaging of breast: Secondary | ICD-10-CM | POA: Diagnosis not present

## 2021-10-24 DIAGNOSIS — Z853 Personal history of malignant neoplasm of breast: Secondary | ICD-10-CM | POA: Diagnosis not present

## 2021-10-24 DIAGNOSIS — C50411 Malignant neoplasm of upper-outer quadrant of right female breast: Secondary | ICD-10-CM

## 2021-10-26 DIAGNOSIS — S50861A Insect bite (nonvenomous) of right forearm, initial encounter: Secondary | ICD-10-CM | POA: Diagnosis not present

## 2021-10-26 DIAGNOSIS — L570 Actinic keratosis: Secondary | ICD-10-CM | POA: Diagnosis not present

## 2021-10-26 DIAGNOSIS — L814 Other melanin hyperpigmentation: Secondary | ICD-10-CM | POA: Diagnosis not present

## 2021-10-26 DIAGNOSIS — S50862A Insect bite (nonvenomous) of left forearm, initial encounter: Secondary | ICD-10-CM | POA: Diagnosis not present

## 2021-10-26 DIAGNOSIS — Z85828 Personal history of other malignant neoplasm of skin: Secondary | ICD-10-CM | POA: Diagnosis not present

## 2021-10-26 DIAGNOSIS — L820 Inflamed seborrheic keratosis: Secondary | ICD-10-CM | POA: Diagnosis not present

## 2021-10-26 DIAGNOSIS — L821 Other seborrheic keratosis: Secondary | ICD-10-CM | POA: Diagnosis not present

## 2021-10-26 DIAGNOSIS — L578 Other skin changes due to chronic exposure to nonionizing radiation: Secondary | ICD-10-CM | POA: Diagnosis not present

## 2021-10-26 DIAGNOSIS — W57XXXA Bitten or stung by nonvenomous insect and other nonvenomous arthropods, initial encounter: Secondary | ICD-10-CM | POA: Diagnosis not present

## 2022-02-20 ENCOUNTER — Telehealth: Payer: Self-pay | Admitting: Pharmacist

## 2022-02-20 NOTE — Telephone Encounter (Signed)
PA for Repatha submitted. Key: [id number]. Pa approved through 12.31.24

## 2022-03-06 ENCOUNTER — Telehealth: Payer: Self-pay

## 2022-03-06 NOTE — Patient Outreach (Signed)
  Care Coordination   Initial Visit Note   03/06/2022 Name: MARYA LOWDEN MRN: [id number] DOB: 07/28/1940  IKRAN PATMAN is a 82 y.o. year old female who sees Deland Pretty, MD for primary care. I spoke with  [name] by phone today.  What matters to the patients health and wellness today?  none    Goals Addressed             This Visit's Progress    COMPLETED: Care Coordination Activities-No follow up required       Care Coordination Interventions: Advised patient to Annual Wellness exam. Discussed [facility] services and support. Assessed SDOH. Advised to discuss with primary care physician if services needed in the future.         SDOH assessments and interventions completed:  Yes  SDOH Interventions Today    Flowsheet Row Most Recent Value  SDOH Interventions   Housing Interventions Intervention Not Indicated  Transportation Interventions Intervention Not Indicated        Care Coordination Interventions:  Yes, provided   Follow up plan: No further intervention required.   Encounter Outcome:  Pt. Visit Completed   Jone Baseman, RN, MSN [facility] Care Management Coordinator Direct Line [phone]

## 2022-03-06 NOTE — Patient Instructions (Signed)
Visit Information  Thank you for taking time to visit with me today. Please don't hesitate to contact me if I can be of assistance to you.   Following are the goals we discussed today:   Goals Addressed             This Visit's Progress    COMPLETED: Care Coordination Activities-No follow up required       Care Coordination Interventions: Advised patient to Annual Wellness exam. Discussed [facility] services and support. Assessed SDOH. Advised to discuss with primary care physician if services needed in the future.         If you are experiencing a Mental Health or Behavioral Health Crisis or need someone to talk to, please call the Suicide and Crisis Lifeline: 988   Patient verbalizes understanding of instructions and care plan provided today and agrees to view in MyChart. Active MyChart status and patient understanding of how to access instructions and care plan via MyChart confirmed with patient.     No further follow up required: decline  Maryelizabeth Eberle J Kamaljit Hizer, RN, MSN [facility] Care Management Coordinator Direct Line [phone]     

## 2022-03-13 ENCOUNTER — Encounter: Payer: Self-pay | Admitting: Cardiovascular Disease

## 2022-03-13 ENCOUNTER — Ambulatory Visit: Payer: Medicare PPO | Attending: Cardiovascular Disease | Admitting: Cardiovascular Disease

## 2022-03-13 VITALS — BP 112/66 | HR 76 | Ht 64.0 in | Wt 161.0 lb

## 2022-03-13 DIAGNOSIS — E782 Mixed hyperlipidemia: Secondary | ICD-10-CM

## 2022-03-13 DIAGNOSIS — R931 Abnormal findings on diagnostic imaging of heart and coronary circulation: Secondary | ICD-10-CM | POA: Diagnosis not present

## 2022-03-13 LAB — HEPATIC FUNCTION PANEL
ALT: 11 IU/L (ref 0–32)
AST: 18 IU/L (ref 0–40)
Albumin: 4.5 g/dL (ref 3.7–4.7)
Alkaline Phosphatase: 57 IU/L (ref 44–121)
Bilirubin Total: 0.4 mg/dL (ref 0.0–1.2)
Bilirubin, Direct: 0.14 mg/dL (ref 0.00–0.40)
Total Protein: 6.6 g/dL (ref 6.0–8.5)

## 2022-03-13 LAB — LIPID PANEL
Chol/HDL Ratio: 2.1 ratio (ref 0.0–4.4)
Cholesterol, Total: 145 mg/dL (ref 100–199)
HDL: 70 mg/dL (ref >39)
LDL Chol Calc (NIH): 59 mg/dL (ref 0–99)
Triglycerides: 85 mg/dL (ref 0–149)
VLDL Cholesterol Cal: 16 mg/dL (ref 5–40)

## 2022-03-13 NOTE — Assessment & Plan Note (Signed)
History of elevated coronary calcium score ordered by Dr. Shelia Media which was 691 with calcium in all 3 coronary arteries.  She is completely asymptomatic.  Based on this we decided to become more aggressive with her risk factors including lipid management.

## 2022-03-13 NOTE — Patient Instructions (Signed)
Medication Instructions:  No changes *If you need a refill on your cardiac medications before your next appointment, please call your pharmacy*   Lab Work: Lipid and liver panel today If you have labs (blood work) drawn today and your tests are completely normal, you will receive your results only by: [place] (if you have MyChart) OR A paper copy in the mail If you have any lab test that is abnormal or we need to change your treatment, we will call you to review the results.   Follow-Up: At [facility], you and your health needs are our priority.  As part of our continuing mission to provide you with exceptional heart care, we have created designated Provider Care Teams.  These Care Teams include your primary Cardiologist (physician) and Advanced Practice Providers (APPs -  Physician Assistants and Nurse Practitioners) who all work together to provide you with the care you need, when you need it.  We recommend signing up for the patient portal called "MyChart".  Sign up information is provided on this After Visit Summary.  MyChart is used to connect with patients for Virtual Visits (Telemedicine).  Patients are able to view lab/test results, encounter notes, upcoming appointments, etc.  Non-urgent messages can be sent to your provider as well.   To learn more about what you can do with MyChart, go to [email or web address].    Your next appointment:   1 year(s)  Provider:   Dr Gwenlyn Found

## 2022-03-13 NOTE — Progress Notes (Signed)
03/13/2022 [name]   07/23/1940  [id number]  Primary Physician Deland Pretty, MD Primary Cardiologist: Lorretta Harp MD [name], [place]  HPI:  Michele [name] is a 82 y.o.  mildly overweight married Caucasian female mother of 2, grandmother 2 grandchildren whose husband [name] is also a patient of mine.  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  I last saw her in the office 03/08/2021. She does have a family history of heart disease with a father who died of a myocardial infarction at age 70.  She has no other risk factors other than hyperlipidemia on low-dose rosuvastatin although she seems to be statin intolerant.  She is very active and does swimming, yoga and works in her yard outside without symptoms.  Recent coronary calcium score ordered by Dr. Shelia Media was 20 with calcium in all 3 coronary arteries although she is completely asymptomatic.   Since I saw her in the office a year ago she continues to do well.  She is very active.  I did a Myoview stress test on her 10/15/2019 which was completely normal.  Her lipid profile is markedly improved on Repatha.   Current Meds  Medication Sig   aspirin EC 81 MG tablet Take 81 mg by mouth daily. Swallow whole.   Cholecalciferol (VITAMIN D3) 1000 units CAPS Take by mouth daily.   Citalopram Hydrobromide (CELEXA PO) Take 10 mg by mouth as needed.   EPINEPHrine (EPIPEN 2-PAK) 0.3 mg/0.3 mL IJ SOAJ injection Inject 0.3 mg into the muscle once as needed (for severe allergic reaction). CAll 911 immediately if you have to use this medicine   Evolocumab (REPATHA SURECLICK) 242 MG/ML SOAJ Inject 140 mg into the skin every 14 (fourteen) days.   fluticasone (VERAMYST) 27.5 MCG/SPRAY nasal spray Place 2 sprays into the nose as needed.     Allergies  Allergen Reactions   Azithromycin Rash   Alpha-Gal Other (See Comments)    Mammalian meat allergy from a [place] tick bite, 02/15/2020  states she has been eating meat for the last year and alpha gal seems to have gone away   Influenza Vaccines Rash    'flu like symptoms for 24 hrs'   Reglan [Metoclopramide] Diarrhea    Social History   Socioeconomic History   Marital status: Married    Spouse name: Not on file   Number of children: 2   Years of education: Not on file   Highest education level: Not on file  Occupational History   Occupation: retired Music therapist  at [employer]   Smoking status: Former    Types: Cigarettes    Start date: 07/18/1956    Quit date: 07/19/1982    Years since quitting: 39.6   Smokeless tobacco: Never  Vaping Use   Vaping Use: Never used  Substance and Sexual Activity   Alcohol use: Yes    Alcohol/week: 2.0 standard drinks of alcohol    Types: 1 Glasses of wine, 1 Shots of liquor per week    Comment: 2 in the eveing before dinner   Drug use: No   Sexual activity: Never  Other Topics Concern   Not on file  Social History  Narrative   Married 1 son 1 daughter   Retired Nurse, children's   2 alcoholic beverages daily former smoker no caffeine no drug use no tobacco now   Social Determinants of Radio broadcast assistant Strain: Not on [employer] Insecurity: Not on file  Transportation Needs: No Transportation Needs (03/06/2022)   PRAPARE - Hydrologist (Medical): No    Lack of Transportation (Non-Medical): No  Physical Activity: Not on file  Stress: Not on file  Social Connections: Not on file  Intimate Partner Violence: Not At Risk (11/27/2017)   Humiliation, Afraid, Rape, and Kick questionnaire    Fear of Current or Ex-Partner: No    Emotionally Abused: No    Physically Abused: No    Sexually Abused: No     Review of Systems: General: negative for chills, fever, night sweats or weight changes.  Cardiovascular: negative for chest pain, dyspnea on exertion, edema, orthopnea,  palpitations, paroxysmal nocturnal dyspnea or shortness of breath Dermatological: negative for rash Respiratory: negative for cough or wheezing Urologic: negative for hematuria Abdominal: negative for nausea, vomiting, diarrhea, bright red blood per rectum, melena, or hematemesis Neurologic: negative for visual changes, syncope, or dizziness All other systems reviewed and are otherwise negative except as noted above.    Blood pressure 112/66, pulse 76, height '5\' 4"'$  (1.626 m), weight 161 lb (73 kg).  General appearance: alert and no distress Neck: no adenopathy, no carotid bruit, no JVD, supple, symmetrical, trachea midline, and thyroid not enlarged, symmetric, no tenderness/mass/nodules Lungs: clear to auscultation bilaterally Heart: regular rate and rhythm, S1, S2 normal, no murmur, click, rub or gallop Extremities: extremities normal, atraumatic, no cyanosis or edema Pulses: 2+ and symmetric Skin: Skin color, texture, turgor normal. No rashes or lesions Neurologic: Grossly normal  EKG sinus rhythm at 76 with left anterior fascicular block.  I personally reviewed this EKG.  ASSESSMENT AND PLAN:   Hyperlipidemia History 3 of hyperlipidemia on Repatha.  We will recheck a lipid liver profile today.  Elevated coronary artery calcium score History of elevated coronary calcium score ordered by Dr. Shelia Media which was 691 with calcium in all 3 coronary arteries.  She is completely asymptomatic.  Based on this we decided to become more aggressive with her risk factors including lipid management.     Lorretta Harp MD FACP,FACC,FAHA, [facility] 03/13/2022 9:20 AM

## 2022-03-13 NOTE — Assessment & Plan Note (Signed)
History 3 of hyperlipidemia on Repatha.  We will recheck a lipid liver profile today.

## 2022-04-19 NOTE — Progress Notes (Incomplete)
Patient Care Team: Deland Pretty, MD as PCP - General (Internal Medicine) Nicholas Lose, MD as Consulting Physician (Hematology and Oncology) Kyung Rudd, MD as Consulting Physician (Radiation Oncology) Delice Bison, Charlestine Massed, NP as Nurse Practitioner (Hematology and Oncology) Erroll Luna, MD as Consulting Physician (General Surgery) Alla Feeling, NP as Nurse Practitioner (Nurse Practitioner)  DIAGNOSIS: No diagnosis found.  SUMMARY OF ONCOLOGIC HISTORY: Oncology History  Ductal carcinoma in situ (DCIS) of left breast  09/30/2017 Initial Diagnosis   Screening detected left breast calcifications 2 groups upper outer quadrant 1.5 cm and 1.2 cm both of which are biopsy-proven high-grade DCIS with calcifications ER PR negative.  Underneath the nipple 2 groups of calcifications.  Posterior group was biopsy-proven fibrocystic change anterior group could not be biopsied.  Tis NX stage 0   11/08/2017 Surgery   2 left lumpectomies: DCIS high-grade 0.9 cm focally less than 0.1 cm to medial margin; second lumpectomy left subareolar: DCIS with calcifications high-grade 1.2 cm focally less than 0.1 cm superior margin and focally,  0.1 cm to anterior margin ER 0%, PR 0% for both Tis NX stage 0   12/18/2017 - 01/13/2018 Radiation Therapy   Adjuvant radiation therapy   01/03/2021 Genetic Testing   Negative hereditary cancer genetic testing: no pathogenic variants detected in [facility] CustomNext-Cancer +RNAinsight Panel.  The report date is January 03, 2021.   The CustomNext-Cancer+RNAinsight panel offered by [facility] includes sequencing and rearrangement analysis for the following 47 genes:  APC, ATM, AXIN2, BARD1, BMPR1A, BRCA1, BRCA2, BRIP1, CDH1, CDK4, CDKN2A, CHEK2, DICER1, EPCAM, GREM1, HOXB13, MEN1, MLH1, MSH2, MSH3, MSH6, MUTYH, NBN, NF1, NF2, NTHL1, PALB2, PMS2, POLD1, POLE, PTEN, RAD51C, RAD51D, RECQL, RET, SDHA, SDHAF2, SDHB, SDHC, SDHD, SMAD4, SMARCA4, STK11, TP53, TSC1, TSC2, and  VHL.  RNA data is routinely analyzed for use in variant interpretation for all genes.   Malignant neoplasm of upper-outer quadrant of right breast in female, estrogen receptor positive ([place])  11/07/2020 Pathology Results   Screening mammogram detected right breast abnormality: Right breast biopsy 9 o'clock position: Grade 1 through 2 IDC ER 100%, PR 95%, Ki-67 5%, HER2 2+ IHC, FISH negative ratio 1.5   12/08/2020 Cancer Staging   Staging form: Breast, AJCC 8th Edition - Clinical stage from 12/08/2020: Stage IA (cT1a, cN0, cM0, G2, ER+, PR+, HER2-) - Signed by Nicholas Lose, MD on 01/12/2021 Stage prefix: Initial diagnosis Method of lymph node assessment: Clinical Histologic grading system: 3 grade system   01/03/2021 Genetic Testing   Negative hereditary cancer genetic testing: no pathogenic variants detected in [facility] CustomNext-Cancer +RNAinsight Panel.  The report date is January 03, 2021.   The CustomNext-Cancer+RNAinsight panel offered by Al[facility] Forts includes sequencing and rearrangement analysis for the following 47 genes:  APC, ATM, AXIN2, BARD1, BMPR1A, BRCA1, BRCA2, BRIP1, CDH1, CDK4, CDKN2A, CHEK2, DICER1, EPCAM, GREM1, HOXB13, MEN1, MLH1, MSH2, MSH3, MSH6, MUTYH, NBN, NF1, NF2, NTHL1, PALB2, PMS2, POLD1, POLE, PTEN, RAD51C, RAD51D, RECQL, RET, SDHA, SDHAF2, SDHB, SDHC, SDHD, SMAD4, SMARCA4, STK11, TP53, TSC1, TSC2, and VHL.  RNA data is routinely analyzed for use in variant interpretation for all genes.   01/03/2021 Surgery   Right lumpectomy: Grade 2 IDC 1.1 cm, intermediate grade DCIS, LCIS, margins negative, ER 100%, PR 95%, HER2 negative, Ki-67 5%     CHIEF COMPLIANT:   INTERVAL HISTORY: Michele [name] is a   ALLERGIES:  is allergic to azithromycin, alpha-gal, influenza vaccines, and reglan [metoclopramide].  MEDICATIONS:  Current Outpatient Medications  Medication Sig Dispense Refill  aspirin EC 81 MG tablet Take 81 mg by mouth daily. Swallow whole.      Cholecalciferol (VITAMIN D3) 1000 units CAPS Take by mouth daily.     Citalopram Hydrobromide (CELEXA PO) Take 10 mg by mouth as needed.     EPINEPHrine (EPIPEN 2-PAK) 0.3 mg/0.3 mL IJ SOAJ injection Inject 0.3 mg into the muscle once as needed (for severe allergic reaction). CAll 911 immediately if you have to use this medicine 2 each 0   Evolocumab (REPATHA SURECLICK) [id number] MG/ML SOAJ Inject 140 mg into the skin every 14 (fourteen) days. 6 mL 3   fluticasone (VERAMYST) 27.5 MCG/SPRAY nasal spray Place 2 sprays into the nose as needed.     No current facility-administered medications for this visit.    PHYSICAL EXAMINATION: ECOG PERFORMANCE STATUS: {CHL ONC ECOG PS:[id number]}  There were no vitals filed for this visit. There were no vitals filed for this visit.  BREAST:*** No palpable masses or nodules in either right or left breasts. No palpable axillary supraclavicular or infraclavicular adenopathy no breast tenderness or nipple discharge. (exam performed in the presence of a chaperone)  LABORATORY DATA:  I have reviewed the data as listed    Latest Ref Rng & Units 03/13/2022    9:26 AM 01/03/2021    6:28 AM 10/09/2017   12:50 PM  CMP  Glucose 70 - 99 mg/dL  113  113   BUN 8 - 23 mg/dL  11  15   Creatinine 0.44 - 1.00 mg/dL  0.83  0.88   Sodium 135 - 145 mmol/L  140  141   Potassium 3.5 - 5.1 mmol/L  3.9  4.7   Chloride 98 - 111 mmol/L  104  102   CO2 22 - 32 mmol/L  28  28   Calcium 8.9 - 10.3 mg/dL  9.2  9.2   Total Protein 6.0 - 8.5 g/dL 6.6  7.1  7.4   Total Bilirubin 0.0 - 1.2 mg/dL 0.4  0.9  0.6   Alkaline Phos 44 - 121 IU/L 57  44  55   AST 0 - 40 IU/L 18  18  16   $ ALT 0 - 32 IU/L 11  12  10     $ Lab Results  Component Value Date   WBC 4.8 01/03/2021   HGB 15.2 (H) 01/03/2021   HCT 46.5 (H) 01/03/2021   MCV 96.9 01/03/2021   PLT 224 01/03/2021   NEUTROABS 2.6 01/03/2021    ASSESSMENT & PLAN:  No problem-specific Assessment & Plan notes found for this  encounter.    No orders of the defined types were placed in this encounter.  The patient has a good understanding of the overall plan. she agrees with it. she will call with any problems that may develop before the next visit here. Total time spent: 30 mins including face to face time and time spent for planning, charting and co-ordination of care   Suzzette Righter, [place] 04/19/22    I Gardiner Coins am acting as a Education administrator for [employer]  ***

## 2022-04-20 ENCOUNTER — Telehealth: Payer: Self-pay | Admitting: Cardiovascular Disease

## 2022-04-20 NOTE — Telephone Encounter (Signed)
Patient is calling stating she has tested positive for covid and says that it feels like she has a cold, but would still like a call back to see if there is anything else she should be doing or taking. Please advise.

## 2022-04-20 NOTE — Telephone Encounter (Signed)
Spoke to patient, advised to contact PCP to discuss.

## 2022-04-23 ENCOUNTER — Inpatient Hospital Stay: Payer: Medicare PPO | Admitting: Hematology and Oncology

## 2022-04-30 ENCOUNTER — Ambulatory Visit (INDEPENDENT_AMBULATORY_CARE_PROVIDER_SITE_OTHER): Payer: Medicare PPO

## 2022-04-30 ENCOUNTER — Telehealth: Payer: Self-pay

## 2022-04-30 ENCOUNTER — Ambulatory Visit: Payer: Medicare PPO | Admitting: Family Medicine

## 2022-04-30 ENCOUNTER — Ambulatory Visit: Payer: Self-pay

## 2022-04-30 VITALS — BP 166/76 | HR 92 | Ht 64.0 in | Wt 157.0 lb

## 2022-04-30 DIAGNOSIS — M25562 Pain in left knee: Secondary | ICD-10-CM

## 2022-04-30 DIAGNOSIS — M5432 Sciatica, left side: Secondary | ICD-10-CM

## 2022-04-30 DIAGNOSIS — M1712 Unilateral primary osteoarthritis, left knee: Secondary | ICD-10-CM

## 2022-04-30 DIAGNOSIS — G8929 Other chronic pain: Secondary | ICD-10-CM

## 2022-04-30 DIAGNOSIS — M545 Low back pain, unspecified: Secondary | ICD-10-CM | POA: Diagnosis not present

## 2022-04-30 NOTE — Patient Instructions (Addendum)
Thank you for coming in today.   Please get an Xray today before you leave   I've referred you to Physical Therapy.  Let us know if you don't hear from them in one week.   If your leg is getting worse let me know.   We will work on authorization for the gel shots and the Zilretta injections.   Check back in 6 weeks

## 2022-04-30 NOTE — Telephone Encounter (Signed)
VOB visco

## 2022-04-30 NOTE — Progress Notes (Unsigned)
   I, Peterson Lombard, LAT, ATC acting as a scribe for Lynne Leader, MD.  Michele [name] is a 82 y.o. female who presents to [place] at [facility] today for cont'd L knee pain and new LBP. Pt was last seen by Dr. Georgina Snell on 09/12/21 and was given a L knee steroid injection. Today, pt reports L knee pain returning about a month after her last visit. Pt c/o L knee pain at night.   Pt also c/o new LBP x 2 month. Pt locates pain to L buttock w/ radiating pain along the posterior aspect of the R thigh and sometimes into the posterior LE.  Radiating pain: yes LE numbness/tingling: no LE weakness: yes Aggravates: sitting in the wrong kind of chair Treatments tried: Asprin, IBU, naproxen, Tylenol  Dx imaging: 09/12/21 L knee XR  Pertinent review of systems: No fevers or chills  Relevant historical information: History of breast cancer.   Exam:  BP (!) 166/76   Pulse 92   Ht '5\' 4"'$  (1.626 m)   Wt 157 lb (71.2 kg)   SpO2 96%   BMI 26.95 kg/m  General: Well Developed, well nourished, and in no acute distress.   MSK: L-spine: Nontender palpation spinal midline normal lumbar motion. Lower spine strength is intact. Negative straight leg raise test.  Left knee: Mild effusion normal motion with crepitation.  Intact strength. Stable ligamentous exam.    Lab and Radiology Results  X-ray images lumbar spine obtained today personally and independently interpreted Facet DJD L4-5 and L5-S1.  No acute fractures.  Aortic atherosclerosis is visible. Await formal radiology review    Assessment and Plan: 82 y.o. female with left posterior leg pain thought to be sciatica or perhaps piriformis syndrome.  Additionally she has had some recurrence of knee pain thought to be DJD related.  Her pain is subsiding a bit which is great news.  She is a wonderful candidate for trial of physical therapy.  Plan to refer to PT.  This is a good chance of improving the sciatica type pain and the  knee pain.  If not better certainly there is more to do such as advanced imaging and injections.  Additionally we could try a steroid injection into the knee. Recheck in about 6 weeks.  PDMP not reviewed this encounter. Orders Placed This Encounter  Procedures   DG Lumbar Spine 2-3 Views    Standing Status:   Future    Number of Occurrences:   1    Standing Expiration Date:   05/31/2022    Order Specific Question:   Reason for Exam (SYMPTOM  OR DIAGNOSIS REQUIRED)    Answer:   low back pain    Order Specific Question:   Preferred imaging location?    Answer:   [facility]   Ambulatory referral to Physical Therapy    Referral Priority:   Routine    Referral Type:   Physical Medicine    Referral Reason:   Specialty Services Required    Requested Specialty:   Physical Therapy    Number of Visits Requested:   1   No orders of the defined types were placed in this encounter.    Discussed warning signs or symptoms. Please see discharge instructions. Patient expresses understanding.   The above documentation has been reviewed and is accurate and complete Lynne Leader, M.D.

## 2022-04-30 NOTE — Telephone Encounter (Signed)
VOB zilretta

## 2022-05-01 NOTE — Telephone Encounter (Signed)
VOB initiated for Zilretta inj LEFT knee

## 2022-05-01 NOTE — Telephone Encounter (Signed)
VOB initiated for Orthovisc LEFT knee

## 2022-05-02 NOTE — Telephone Encounter (Signed)
Prior Authorization initiated for [facility] via Availity/[employer] Case ID: [id number]

## 2022-05-02 NOTE — Telephone Encounter (Addendum)
Prior auth required for [employer]

## 2022-05-02 NOTE — Progress Notes (Signed)
Lumbar spine x-ray shows some mild arthritis changes

## 2022-05-03 NOTE — Telephone Encounter (Signed)
Orthovisc for LEFT knee OA  Copay: $40 Co-insurance: n/a Deductible: does not apply Prior Auth: NOT required   *Benefits run for Orthovisc and [employer]

## 2022-05-03 NOTE — Telephone Encounter (Signed)
Form received from [facility] requesting additional information. Due by 05/04/22  Form completed and faxed back to [facility].

## 2022-05-04 NOTE — Progress Notes (Signed)
Patient Care Team: Merri Brunette, MD as PCP - General (Internal Medicine) Serena Croissant, MD as Consulting Physician (Hematology and Oncology) Dorothy Puffer, MD as Consulting Physician (Radiation Oncology) Axel Filler, Larna Daughters, NP as Nurse Practitioner (Hematology and Oncology) Harriette Bouillon, MD as Consulting Physician (General Surgery) Pollyann Samples, NP as Nurse Practitioner (Nurse Practitioner)  DIAGNOSIS:  Encounter Diagnosis  Name Primary?   Ductal carcinoma in situ (DCIS) of left breast Yes    SUMMARY OF ONCOLOGIC HISTORY: Oncology History  Ductal carcinoma in situ (DCIS) of left breast  09/30/2017 Initial Diagnosis   Screening detected left breast calcifications 2 groups upper outer quadrant 1.5 cm and 1.2 cm both of which are biopsy-proven high-grade DCIS with calcifications ER PR negative.  Underneath the nipple 2 groups of calcifications.  Posterior group was biopsy-proven fibrocystic change anterior group could not be biopsied.  Tis NX stage 0   11/08/2017 Surgery   2 left lumpectomies: DCIS high-grade 0.9 cm focally less than 0.1 cm to medial margin; second lumpectomy left subareolar: DCIS with calcifications high-grade 1.2 cm focally less than 0.1 cm superior margin and focally,  0.1 cm to anterior margin ER 0%, PR 0% for both Tis NX stage 0   12/18/2017 - 01/13/2018 Radiation Therapy   Adjuvant radiation therapy   01/03/2021 Genetic Testing   Negative hereditary cancer genetic testing: no pathogenic variants detected in [facility] CustomNext-Cancer +RNAinsight Panel.  The report date is January 03, 2021.   The CustomNext-Cancer+RNAinsight panel offered by [employer] includes sequencing and rearrangement analysis for the following 47 genes:  APC, ATM, AXIN2, BARD1, BMPR1A, BRCA1, BRCA2, BRIP1, CDH1, CDK4, CDKN2A, CHEK2, DICER1, EPCAM, GREM1, HOXB13, MEN1, MLH1, MSH2, MSH3, MSH6, MUTYH, NBN, NF1, NF2, NTHL1, PALB2, PMS2, POLD1, POLE, PTEN, RAD51C, RAD51D, RECQL, RET, SDHA,  SDHAF2, SDHB, SDHC, SDHD, SMAD4, SMARCA4, STK11, TP53, TSC1, TSC2, and VHL.  RNA data is routinely analyzed for use in variant interpretation for all genes.   Malignant neoplasm of upper-outer quadrant of right breast in female, estrogen receptor positive (HCC)  11/07/2020 Pathology Results   Screening mammogram detected right breast abnormality: Right breast biopsy 9 o'clock position: Grade 1 through 2 IDC ER 100%, PR 95%, Ki-67 5%, HER2 2+ IHC, FISH negative ratio 1.5   12/08/2020 Cancer Staging   Staging form: Breast, AJCC 8th Edition - Clinical stage from 12/08/2020: Stage IA (cT1a, cN0, cM0, G2, ER+, PR+, HER2-) - Signed by Serena Croissant, MD on 01/12/2021 Stage prefix: Initial diagnosis Method of lymph node assessment: Clinical Histologic grading system: 3 grade system   01/03/2021 Genetic Testing   Negative hereditary cancer genetic testing: no pathogenic variants detected in [facility] CustomNext-Cancer +RNAinsight Panel.  The report date is January 03, 2021.   The CustomNext-Cancer+RNAinsight panel offered by [employer] includes sequencing and rearrangement analysis for the following 47 genes:  APC, ATM, AXIN2, BARD1, BMPR1A, BRCA1, BRCA2, BRIP1, CDH1, CDK4, CDKN2A, CHEK2, DICER1, EPCAM, GREM1, HOXB13, MEN1, MLH1, MSH2, MSH3, MSH6, MUTYH, NBN, NF1, NF2, NTHL1, PALB2, PMS2, POLD1, POLE, PTEN, RAD51C, RAD51D, RECQL, RET, SDHA, SDHAF2, SDHB, SDHC, SDHD, SMAD4, SMARCA4, STK11, TP53, TSC1, TSC2, and VHL.  RNA data is routinely analyzed for use in variant interpretation for all genes.   01/03/2021 Surgery   Right lumpectomy: Grade 2 IDC 1.1 cm, intermediate grade DCIS, LCIS, margins negative, ER 100%, PR 95%, HER2 negative, Ki-67 5%     CHIEF COMPLIANT:  Follow-up of left breast DCIS   INTERVAL HISTORY: Michele [name] is a 82 y.o. with above-mentioned history  of left breast DCIS treated with two lumpectomies, radiation, and is currently on surveillance. She presents to the clinic today for  follow-up. She reports that her health has been fine. She does have some arthritis, but says she is doing some physical therapy and does some exercising. She says she does swim and it seems to help.    ALLERGIES:  is allergic to azithromycin, alpha-gal, influenza virus vaccine, and influenza vaccines.  MEDICATIONS:  Current Outpatient Medications  Medication Sig Dispense Refill   aspirin EC 81 MG tablet Take 81 mg by mouth daily. Swallow whole.     Cholecalciferol (VITAMIN D3) 1000 units CAPS Take by mouth daily.     Citalopram Hydrobromide (CELEXA PO) Take 10 mg by mouth as needed.     EPINEPHrine (EPIPEN 2-PAK) 0.3 mg/0.3 mL IJ SOAJ injection Inject 0.3 mg into the muscle once as needed (for severe allergic reaction). CAll 911 immediately if you have to use this medicine 2 each 0   Evolocumab (REPATHA SURECLICK) 140 MG/ML SOAJ Inject 140 mg into the skin every 14 (fourteen) days. 6 mL 3   fluticasone (VERAMYST) 27.5 MCG/SPRAY nasal spray Place 2 sprays into the nose as needed.     No current facility-administered medications for this visit.    PHYSICAL EXAMINATION: ECOG PERFORMANCE STATUS: 1 - Symptomatic but completely ambulatory  Vitals:   05/08/22 1105  BP: (!) 127/94  Pulse: 78  Resp: 18  Temp: (!) 97.3 F (36.3 C)  SpO2: 100%   Filed Weights   05/08/22 1105  Weight: 157 lb 6.4 oz (71.4 kg)    BREAST: No palpable masses or nodules in either right or left breasts. No palpable axillary supraclavicular or infraclavicular adenopathy no breast tenderness or nipple discharge. (exam performed in the presence of a chaperone)  LABORATORY DATA:  I have reviewed the data as listed    Latest Ref Rng & Units 03/13/2022    9:26 AM 01/03/2021    6:28 AM 10/09/2017   12:50 PM  CMP  Glucose 70 - 99 mg/dL  409  811   BUN 8 - 23 mg/dL  11  15   Creatinine 9.14 - 1.00 mg/dL  7.82  9.56   Sodium 213 - 145 mmol/L  140  141   Potassium 3.5 - 5.1 mmol/L  3.9  4.7   Chloride 98 - 111  mmol/L  104  102   CO2 22 - 32 mmol/L  28  28   Calcium 8.9 - 10.3 mg/dL  9.2  9.2   Total Protein 6.0 - 8.5 g/dL 6.6  7.1  7.4   Total Bilirubin 0.0 - 1.2 mg/dL 0.4  0.9  0.6   Alkaline Phos 44 - 121 IU/L 57  44  55   AST 0 - 40 IU/L 18  18  16    ALT 0 - 32 IU/L 11  12  10      Lab Results  Component Value Date   WBC 4.8 01/03/2021   HGB 15.2 (H) 01/03/2021   HCT 46.5 (H) 01/03/2021   MCV 96.9 01/03/2021   PLT 224 01/03/2021   NEUTROABS 2.6 01/03/2021    ASSESSMENT & PLAN:  Ductal carcinoma in situ (DCIS) of left breast 01/03/2021:Right lumpectomy: Grade 2 IDC 1.1 cm, intermediate grade DCIS, LCIS, margins negative, ER 100%, PR 95%, HER2 negative, Ki-67 5%   (11/08/2017:Two left lumpectomies: DCIS high-grade 0.9 cm focally less than 0.1 cm to medial margin; second lumpectomy left subareolar: DCIS with calcifications high-grade  1.2 cm focally less than 0.1 cm superior margin and focally,  0.1 cm to anterior margin ER 0%, PR 0% for both Tis NX stage 0 Adjuvant radiation therapy completed 01/13/2018)   Treatment plan:  XRT started 02/22/2021 2. Adjuvant antiestrogen therapy with letrozole 2.5 mg daily x5 years started November 2022 discontinued January 2023 (for severe joint stiffness and achiness)   Breast cancer surveillance: Breast exam 05/08/2022: Benign Mammogram 10/24/2021: Benign breast density category B   Alpha gal syndrome: Patient has unique sensitivities to drugs which will need to be kept in mind before prescribing anything. Return to clinic in 1 year for long-term survivorship clinic with Mardella Layman    No orders of the defined types were placed in this encounter.  The patient has a good understanding of the overall plan. she agrees with it. she will call with any problems that may develop before the next visit here. Total time spent: 30 mins including face to face time and time spent for planning, charting and co-ordination of care   Tamsen Meek,  MD 05/08/22    I Janan Ridge am acting as a Neurosurgeon for [employer]  I have reviewed the above documentation for accuracy and completeness, and I agree with the above.

## 2022-05-04 NOTE — Telephone Encounter (Signed)
Appointment scheduled.

## 2022-05-08 ENCOUNTER — Inpatient Hospital Stay: Payer: Medicare PPO | Attending: Hematology and Oncology | Admitting: Hematology and Oncology

## 2022-05-08 VITALS — BP 127/94 | HR 78 | Temp 97.3°F | Resp 18 | Ht 64.0 in | Wt 157.4 lb

## 2022-05-08 DIAGNOSIS — C50411 Malignant neoplasm of upper-outer quadrant of right female breast: Secondary | ICD-10-CM | POA: Diagnosis not present

## 2022-05-08 DIAGNOSIS — Z170 Estrogen receptor positive status [ER+]: Secondary | ICD-10-CM | POA: Diagnosis not present

## 2022-05-08 DIAGNOSIS — D0512 Intraductal carcinoma in situ of left breast: Secondary | ICD-10-CM | POA: Diagnosis not present

## 2022-05-08 NOTE — Assessment & Plan Note (Signed)
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 Adjuvant antiestrogen therapy with letrozole 2.5 mg daily x5 years started November 2022 discontinued January 2023 (for severe joint stiffness and achiness)   Breast cancer surveillance: Breast exam 05/08/2022: Benign Mammogram 10/24/2021: Benign breast density category B   Return to clinic in 1 year

## 2022-05-09 NOTE — Telephone Encounter (Signed)
Prior Authorization initiated for [facility] via [email or web address] KEY: [id number]

## 2022-05-10 ENCOUNTER — Other Ambulatory Visit: Payer: Self-pay

## 2022-05-10 ENCOUNTER — Ambulatory Visit: Payer: Medicare PPO | Admitting: Family Medicine

## 2022-05-10 VITALS — BP 168/88 | HR 76 | Ht 64.0 in | Wt 157.0 lb

## 2022-05-10 DIAGNOSIS — M5432 Sciatica, left side: Secondary | ICD-10-CM

## 2022-05-10 DIAGNOSIS — G8929 Other chronic pain: Secondary | ICD-10-CM

## 2022-05-10 DIAGNOSIS — M25562 Pain in left knee: Secondary | ICD-10-CM | POA: Diagnosis not present

## 2022-05-10 DIAGNOSIS — M1712 Unilateral primary osteoarthritis, left knee: Secondary | ICD-10-CM

## 2022-05-10 MED ORDER — GABAPENTIN 100 MG PO CAPS
100.0000 mg | ORAL_CAPSULE | Freq: Every evening | ORAL | 3 refills | Status: DC | PRN
Start: 2022-05-10 — End: 2023-05-09

## 2022-05-10 MED ORDER — HYALURONAN 30 MG/2ML IX SOSY
30.0000 mg | PREFILLED_SYRINGE | Freq: Once | INTRA_ARTICULAR | Status: AC
  Administered 2022-05-10: 30 mg via INTRA_ARTICULAR

## 2022-05-10 NOTE — Progress Notes (Signed)
   Shirlyn Goltz, PhD, LAT, ATC acting as a scribe for Lynne Leader, MD.  Michele [name] is a 82 y.o. female who presents to [place] at [facility] today for f/u L knee pain and to begin the Orthovisc series.  Patient was last seen by Dr. Georgina Snell on 04/30/2022 and was referred to [facility]. Today, pt reports L knee pain is about the same. She is wanting to give the gel shots a try.  Additionally she notes some recurrent radicular pain down the posterior left thigh.  She was previously seen for piriformis syndrome or sciatica.  She improved with physical therapy.  The pain is mostly bothersome only at bedtime and does interfere with sleep.  Dx imaging: 09/12/21 L knee XR   Pertinent review of systems: No fevers or chills  Relevant historical information: History of breast cancer   Exam:  BP (!) 168/88   Pulse 76   Ht 5\' 4"  (1.626 m)   Wt 157 lb (71.2 kg)   SpO2 96%   BMI 26.95 kg/m  General: Well Developed, well nourished, and in no acute distress.   MSK: Left knee: Minimal effusion normal motion with crepitation.    Lab and Radiology Results  Orthovisc injection left knee 1/3 Procedure: Real-time Ultrasound Guided Injection of left knee superior lateral patellar space Device: Philips Affiniti 50G Images permanently stored and available for review in PACS Verbal informed consent obtained.  Discussed risks and benefits of procedure. Warned about infection, bleeding, damage to structures among others. Patient expresses understanding and agreement Time-out conducted.   Noted no overlying erythema, induration, or other signs of local infection.   Skin prepped in a sterile fashion.   Local anesthesia: Topical Ethyl chloride.   With sterile technique and under real time ultrasound guidance: Orthovisc 30 mg injected into knee joint. Fluid seen entering the joint capsule.   Completed without difficulty   Advised to call if fevers/chills, erythema, induration,  drainage, or persistent bleeding.   Images permanently stored and available for review in the ultrasound unit.  Impression: Technically successful ultrasound guided injection. Lot number: [id number]        Assessment and Plan: 82 y.o. female with Left knee pain due to DJD: Plan for Orthovisc injection starting today.   Left posterior thigh radicular pain at bedtime thought to be sciatica or priformis syndrome. Plan for low dose gabapentin at bedtime.   Return in 1 week for othovisc injection left knee 2/3   PDMP not reviewed this encounter. Orders Placed This Encounter  Procedures   [place] LIMITED JOINT SPACE STRUCTURES LOW LEFT(NO LINKED CHARGES)    Order Specific Question:   Reason for Exam (SYMPTOM  OR DIAGNOSIS REQUIRED)    Answer:   left knee pain    Order Specific Question:   Preferred imaging location?    Answer:   [facility]   Meds ordered this encounter  Medications   Hyaluronan (ORTHOVISC) intra-articular injection 30 mg   gabapentin (NEURONTIN) 100 MG capsule    Sig: Take 1-3 capsules (100-300 mg total) by mouth at bedtime as needed (serve pain).    Dispense:  30 capsule    Refill:  3     Discussed warning signs or symptoms. Please see discharge instructions. Patient expresses understanding.   The above documentation has been reviewed and is accurate and complete Lynne Leader, M.D.

## 2022-05-10 NOTE — Telephone Encounter (Signed)
Coverage for Zilretta for knee OA has been denied.

## 2022-05-10 NOTE — Telephone Encounter (Signed)
Will proceed with Orthovisc, appt scheduled for 05/10/22.

## 2022-05-10 NOTE — Patient Instructions (Addendum)
Thank you for coming in today.   You received an injection today. Seek immediate medical attention if the joint becomes red, extremely painful, or is oozing fluid.   Schedule the 2nd gel shot for next week and the 3rd for the week after that  I've sent a prescription for Gabapentin to your pharmacy. Try this for the nerve pain

## 2022-05-15 DIAGNOSIS — M6281 Muscle weakness (generalized): Secondary | ICD-10-CM | POA: Diagnosis not present

## 2022-05-15 DIAGNOSIS — M545 Low back pain, unspecified: Secondary | ICD-10-CM | POA: Diagnosis not present

## 2022-05-15 DIAGNOSIS — M25562 Pain in left knee: Secondary | ICD-10-CM | POA: Diagnosis not present

## 2022-05-17 DIAGNOSIS — L821 Other seborrheic keratosis: Secondary | ICD-10-CM | POA: Diagnosis not present

## 2022-05-17 DIAGNOSIS — L570 Actinic keratosis: Secondary | ICD-10-CM | POA: Diagnosis not present

## 2022-05-17 DIAGNOSIS — Z85828 Personal history of other malignant neoplasm of skin: Secondary | ICD-10-CM | POA: Diagnosis not present

## 2022-05-17 DIAGNOSIS — L814 Other melanin hyperpigmentation: Secondary | ICD-10-CM | POA: Diagnosis not present

## 2022-05-18 ENCOUNTER — Ambulatory Visit: Payer: Medicare PPO | Admitting: Family Medicine

## 2022-05-18 ENCOUNTER — Other Ambulatory Visit: Payer: Self-pay

## 2022-05-18 DIAGNOSIS — M1712 Unilateral primary osteoarthritis, left knee: Secondary | ICD-10-CM

## 2022-05-18 DIAGNOSIS — M25562 Pain in left knee: Secondary | ICD-10-CM | POA: Diagnosis not present

## 2022-05-18 DIAGNOSIS — G8929 Other chronic pain: Secondary | ICD-10-CM

## 2022-05-18 MED ORDER — HYALURONAN 30 MG/2ML IX SOSY
30.0000 mg | PREFILLED_SYRINGE | Freq: Once | INTRA_ARTICULAR | Status: AC
  Administered 2022-05-18: 30 mg via INTRA_ARTICULAR

## 2022-05-18 NOTE — Progress Notes (Signed)
  Michele [name] presents to clinic today for Orthovisc injection left knee #2/3   Procedure: Real-time Ultrasound Guided Injection of left knee joint superior lateral patellar space Device: Philips Affiniti 50G Images permanently stored and available for review in PACS Verbal informed consent obtained.  Discussed risks and benefits of procedure. Warned about infection, bleeding, damage to structures among others. Patient expresses understanding and agreement Time-out conducted.   Noted no overlying erythema, induration, or other signs of local infection.   Skin prepped in a sterile fashion.   Local anesthesia: Topical Ethyl chloride.   With sterile technique and under real time ultrasound guidance: Orthovisc 30 mg injected into knee joint. Fluid seen entering the joint capsule.   Completed without difficulty   Advised to call if fevers/chills, erythema, induration, drainage, or persistent bleeding.   Images permanently stored and available for review in the ultrasound unit.  Impression: Technically successful ultrasound guided injection.  Lot number: [id number] Return in a little over 1 week for Orthovisc injection left knee 3/3

## 2022-05-18 NOTE — Patient Instructions (Addendum)
Thank you for coming in today.   You received an injection today. Seek immediate medical attention if the joint becomes red, extremely painful, or is oozing fluid.   We will see you April 2nd for the 3rd Orthovisc injection.

## 2022-05-23 DIAGNOSIS — M5432 Sciatica, left side: Secondary | ICD-10-CM | POA: Diagnosis not present

## 2022-05-23 DIAGNOSIS — M6281 Muscle weakness (generalized): Secondary | ICD-10-CM | POA: Diagnosis not present

## 2022-05-23 DIAGNOSIS — M25562 Pain in left knee: Secondary | ICD-10-CM | POA: Diagnosis not present

## 2022-05-29 ENCOUNTER — Other Ambulatory Visit: Payer: Self-pay

## 2022-05-29 ENCOUNTER — Ambulatory Visit: Payer: Medicare PPO | Admitting: Family Medicine

## 2022-05-29 DIAGNOSIS — G8929 Other chronic pain: Secondary | ICD-10-CM

## 2022-05-29 DIAGNOSIS — M1712 Unilateral primary osteoarthritis, left knee: Secondary | ICD-10-CM | POA: Diagnosis not present

## 2022-05-29 DIAGNOSIS — M25562 Pain in left knee: Secondary | ICD-10-CM | POA: Diagnosis not present

## 2022-05-29 DIAGNOSIS — M25552 Pain in left hip: Secondary | ICD-10-CM

## 2022-05-29 MED ORDER — HYALURONAN 30 MG/2ML IX SOSY
30.0000 mg | PREFILLED_SYRINGE | Freq: Once | INTRA_ARTICULAR | Status: AC
  Administered 2022-05-29: 30 mg via INTRA_ARTICULAR

## 2022-05-29 NOTE — Progress Notes (Unsigned)
Orthovisc left knee 3/3  Lot number: [id number]  Added PT for left hip pain.  Currently attending [facility].   Recheck as needed.  Neck step would be steroid injection in the knee again if needed.  Could authorize Zilretta at that time if needed.

## 2022-05-29 NOTE — Telephone Encounter (Signed)
Orthovisc inj schedule  #1 05/10/22 #2 05/18/22 #3 05/29/22

## 2022-05-29 NOTE — Patient Instructions (Addendum)
Thank you for coming in today.   You received an injection today. Seek immediate medical attention if the joint becomes red, extremely painful, or is oozing fluid.   If this shot does not work well next step is a repeat cortisone injection and we can get zilretta approved.   I added you hip onto PT.   OK to cancel Appt scheduled on 4/15

## 2022-06-05 DIAGNOSIS — M25562 Pain in left knee: Secondary | ICD-10-CM | POA: Diagnosis not present

## 2022-06-05 DIAGNOSIS — M6281 Muscle weakness (generalized): Secondary | ICD-10-CM | POA: Diagnosis not present

## 2022-06-05 DIAGNOSIS — M5432 Sciatica, left side: Secondary | ICD-10-CM | POA: Diagnosis not present

## 2022-06-11 ENCOUNTER — Ambulatory Visit: Payer: Medicare PPO | Admitting: Family Medicine

## 2022-06-12 DIAGNOSIS — M6281 Muscle weakness (generalized): Secondary | ICD-10-CM | POA: Diagnosis not present

## 2022-06-12 DIAGNOSIS — M25562 Pain in left knee: Secondary | ICD-10-CM | POA: Diagnosis not present

## 2022-06-12 DIAGNOSIS — M5432 Sciatica, left side: Secondary | ICD-10-CM | POA: Diagnosis not present

## 2022-06-14 ENCOUNTER — Telehealth: Payer: Self-pay

## 2022-06-14 NOTE — Telephone Encounter (Signed)
Patient states that she is still not doing well with the other steroid injections that she is getting to help get the zilretta covered can we re-run her?  Patient would like a call back to discuss

## 2022-06-15 NOTE — Telephone Encounter (Signed)
Michele [name], [place] hours ago (12:04 PM)    Patient states that she is still not doing well with the other steroid injections that she is getting to help get the zilretta covered can we re-run her?  Patient would like a call back to discuss

## 2022-06-18 NOTE — Telephone Encounter (Signed)
VOB initiated for Zilretta for LEFT knee OA 

## 2022-06-19 DIAGNOSIS — M5432 Sciatica, left side: Secondary | ICD-10-CM | POA: Diagnosis not present

## 2022-06-19 DIAGNOSIS — M25562 Pain in left knee: Secondary | ICD-10-CM | POA: Diagnosis not present

## 2022-06-19 DIAGNOSIS — M6281 Muscle weakness (generalized): Secondary | ICD-10-CM | POA: Diagnosis not present

## 2022-06-19 NOTE — Telephone Encounter (Signed)
Prior auth required for [facility] for LEFT knee OA

## 2022-06-19 NOTE — Telephone Encounter (Addendum)
Unable to initiated prior auth via CoverMyMeds; message received that previous auth request (which was denied) is still pending.

## 2022-06-21 NOTE — Telephone Encounter (Signed)
Prior Authorization initiated for [facility] via [employer] Case ID: [id number]

## 2022-06-27 NOTE — Telephone Encounter (Signed)
Prior auth for Michele [name] APPROVED  PA# [id number] Valid: 06/21/22-12/21/22

## 2022-06-27 NOTE — Telephone Encounter (Signed)
Zilretta for LEFT knee OA  Primary Insurance: [employer] [place] [employer] Co-Pay: $40 Co-Insurance: 0% Deductible: does not apply  Prior Auth: APPROVED PA# [id number] Valid: 06/21/22-12/21/22

## 2022-06-27 NOTE — Telephone Encounter (Signed)
Left message for patient to call back to schedule.  °

## 2022-06-28 ENCOUNTER — Other Ambulatory Visit: Payer: Self-pay

## 2022-06-28 ENCOUNTER — Ambulatory Visit: Payer: Medicare PPO | Admitting: Family Medicine

## 2022-06-28 VITALS — Ht 64.0 in | Wt 155.0 lb

## 2022-06-28 DIAGNOSIS — G8929 Other chronic pain: Secondary | ICD-10-CM | POA: Diagnosis not present

## 2022-06-28 DIAGNOSIS — M6281 Muscle weakness (generalized): Secondary | ICD-10-CM | POA: Diagnosis not present

## 2022-06-28 DIAGNOSIS — M25562 Pain in left knee: Secondary | ICD-10-CM

## 2022-06-28 DIAGNOSIS — M1712 Unilateral primary osteoarthritis, left knee: Secondary | ICD-10-CM | POA: Diagnosis not present

## 2022-06-28 DIAGNOSIS — M5432 Sciatica, left side: Secondary | ICD-10-CM | POA: Diagnosis not present

## 2022-06-28 MED ORDER — TRIAMCINOLONE ACETONIDE 32 MG IX SRER
32.0000 mg | Freq: Once | INTRA_ARTICULAR | Status: AC
  Administered 2022-06-28: 32 mg via INTRA_ARTICULAR

## 2022-06-28 NOTE — Progress Notes (Signed)
   Rubin Payor, PhD, LAT, ATC acting as a scribe for [name], MD.  Michele [name] is a 82 y.o. female who presents to [facility] today for cont'd L knee pain and Zilretta injection. Pt was last seen by Dr. Denyse Amass on 05/29/22 and completed the Orthovisc series, 3/3, in her L knee. Today, pt reports only a few days of relief from prior Orthovisc injections. She is wanting to give Zilretta a try today.   Dx imaging: 09/12/21 L knee XR   Pertinent review of systems: No fevers or chills  Relevant historical information: Breast cancer history   Exam:  Ht 5\' 4"  (1.626 m)   Wt 155 lb (70.3 kg)   BMI 26.61 kg/m  General: Well Developed, well nourished, and in no acute distress.   MSK: Left knee mild effusion normal-appearing otherwise normal motion.    Lab and Radiology Results  Procedure: Real-time Ultrasound Guided Injection of left knee joint superior lateral patellar space Device: Philips Affiniti 50G Images permanently stored and available for review in PACS Verbal informed consent obtained.  Discussed risks and benefits of procedure. Warned about infection, bleeding, hyperglycemia damage to structures among others. Patient expresses understanding and agreement Time-out conducted.   Noted no overlying erythema, induration, or other signs of local infection.   Skin prepped in a sterile fashion.   Local anesthesia: Topical Ethyl chloride.   With sterile technique and under real time ultrasound guidance: Zilretta 32 mg injected into knee joint. Fluid seen entering the joint capsule.   Completed without difficulty   Advised to call if fevers/chills, erythema, induration, drainage, or persistent bleeding.   Images permanently stored and available for review in the ultrasound unit.  Impression: Technically successful ultrasound guided injection. Lot number: [id number]       Assessment and Plan: 82 y.o. female with left knee pain due to DJD.   Failing typical conservative management options.  Proceed to Zilretta injection today.  If this does not work well enough next step would be orthopedic surgery consultation for knee replacement.   PDMP not reviewed this encounter. Orders Placed This Encounter  Procedures   [place] LIMITED JOINT SPACE STRUCTURES LOW LEFT(NO LINKED CHARGES)    Order Specific Question:   Reason for Exam (SYMPTOM  OR DIAGNOSIS REQUIRED)    Answer:   left knee pain    Order Specific Question:   Preferred imaging location?    Answer:   Adult [facility] ordered this encounter  Medications   Triamcinolone Acetonide (ZILRETTA) intra-articular injection 32 mg     Discussed warning signs or symptoms. Please see discharge instructions. Patient expresses understanding.   The above documentation has been reviewed and is accurate and complete [name], M.D.

## 2022-06-28 NOTE — Patient Instructions (Signed)
Thank you for coming in today.   You received an injection today. Seek immediate medical attention if the joint becomes red, extremely painful, or is oozing fluid.  

## 2022-07-03 DIAGNOSIS — M5432 Sciatica, left side: Secondary | ICD-10-CM | POA: Diagnosis not present

## 2022-07-03 DIAGNOSIS — M6281 Muscle weakness (generalized): Secondary | ICD-10-CM | POA: Diagnosis not present

## 2022-07-03 DIAGNOSIS — M25562 Pain in left knee: Secondary | ICD-10-CM | POA: Diagnosis not present

## 2022-07-04 NOTE — Telephone Encounter (Signed)
Pt received Zilretta inj for LEFT knee OA 06/28/22.  Can consider repeat inj on or after 09/21/22

## 2022-07-09 DIAGNOSIS — M5432 Sciatica, left side: Secondary | ICD-10-CM | POA: Diagnosis not present

## 2022-07-09 DIAGNOSIS — M25562 Pain in left knee: Secondary | ICD-10-CM | POA: Diagnosis not present

## 2022-07-09 DIAGNOSIS — M6281 Muscle weakness (generalized): Secondary | ICD-10-CM | POA: Diagnosis not present

## 2022-08-16 DIAGNOSIS — E785 Hyperlipidemia, unspecified: Secondary | ICD-10-CM | POA: Diagnosis not present

## 2022-08-16 DIAGNOSIS — R7309 Other abnormal glucose: Secondary | ICD-10-CM | POA: Diagnosis not present

## 2022-08-20 DIAGNOSIS — J309 Allergic rhinitis, unspecified: Secondary | ICD-10-CM | POA: Diagnosis not present

## 2022-08-20 DIAGNOSIS — I2510 Atherosclerotic heart disease of native coronary artery without angina pectoris: Secondary | ICD-10-CM | POA: Diagnosis not present

## 2022-08-20 DIAGNOSIS — G4762 Sleep related leg cramps: Secondary | ICD-10-CM | POA: Diagnosis not present

## 2022-08-20 DIAGNOSIS — Z0000 Encounter for general adult medical examination without abnormal findings: Secondary | ICD-10-CM | POA: Diagnosis not present

## 2022-08-20 DIAGNOSIS — Z853 Personal history of malignant neoplasm of breast: Secondary | ICD-10-CM | POA: Diagnosis not present

## 2022-08-20 DIAGNOSIS — Z8709 Personal history of other diseases of the respiratory system: Secondary | ICD-10-CM | POA: Diagnosis not present

## 2022-08-20 DIAGNOSIS — M810 Age-related osteoporosis without current pathological fracture: Secondary | ICD-10-CM | POA: Diagnosis not present

## 2022-08-20 DIAGNOSIS — F339 Major depressive disorder, recurrent, unspecified: Secondary | ICD-10-CM | POA: Diagnosis not present

## 2022-08-20 DIAGNOSIS — K219 Gastro-esophageal reflux disease without esophagitis: Secondary | ICD-10-CM | POA: Diagnosis not present

## 2022-08-23 ENCOUNTER — Other Ambulatory Visit: Payer: Self-pay | Admitting: Hematology and Oncology

## 2022-08-23 DIAGNOSIS — Z9889 Other specified postprocedural states: Secondary | ICD-10-CM

## 2022-08-23 DIAGNOSIS — Z853 Personal history of malignant neoplasm of breast: Secondary | ICD-10-CM

## 2022-08-27 NOTE — Telephone Encounter (Signed)
VOB initiated for Zilretta for LEFT knee OA 

## 2022-08-29 NOTE — Telephone Encounter (Signed)
Prior Auth required for Zilretta for LEFT knee OA

## 2022-09-04 NOTE — Telephone Encounter (Signed)
Prior Authorization initiated for [facility] via [email or web address] KEY: [id number]   Previous prior auth approval number: [id number]

## 2022-09-04 NOTE — Telephone Encounter (Signed)
Approval on file.

## 2022-09-06 NOTE — Telephone Encounter (Addendum)
Zilretta for LEFT knee OA Can consider repeat inj on or after 09/21/22   Primary Insurance: [employer] [place] [employer] Co-pay: $40 Co-insurance: n/a Deductible: does not apply  Prior Auth APPROVED PA# [id number] Valid: 06/21/22-12/21/22    LEFT knee injection hx 09/12/21 - Kenalog 05/10/22 - Orthovisc #1 05/18/22 - Orthovisc #2 05/29/22 - Orthovisc #3 06/28/22 - Arletta Bale

## 2022-09-06 NOTE — Telephone Encounter (Addendum)
APPROVED  PA# [id number] Valid: 06/21/22-12/21/22

## 2022-09-11 NOTE — Telephone Encounter (Signed)
Holding until needed by patient.

## 2022-09-27 NOTE — Telephone Encounter (Signed)
Patient scheduled for 8/7

## 2022-10-03 ENCOUNTER — Ambulatory Visit: Payer: Medicare PPO | Admitting: Family Medicine

## 2022-10-03 ENCOUNTER — Encounter: Payer: Self-pay | Admitting: Family Medicine

## 2022-10-03 ENCOUNTER — Other Ambulatory Visit: Payer: Self-pay

## 2022-10-03 VITALS — BP 136/76 | HR 73 | Ht 64.0 in | Wt 157.0 lb

## 2022-10-03 DIAGNOSIS — G8929 Other chronic pain: Secondary | ICD-10-CM | POA: Diagnosis not present

## 2022-10-03 DIAGNOSIS — M25562 Pain in left knee: Secondary | ICD-10-CM

## 2022-10-03 DIAGNOSIS — M1712 Unilateral primary osteoarthritis, left knee: Secondary | ICD-10-CM | POA: Diagnosis not present

## 2022-10-03 MED ORDER — TRIAMCINOLONE ACETONIDE 32 MG IX SRER
32.0000 mg | Freq: Once | INTRA_ARTICULAR | Status: AC
  Administered 2022-10-03: 32 mg via INTRA_ARTICULAR

## 2022-10-03 NOTE — Patient Instructions (Signed)
Thank you for coming in today.   You received an injection today. Seek immediate medical attention if the joint becomes red, extremely painful, or is oozing fluid.  

## 2022-10-03 NOTE — Progress Notes (Signed)
   I, Stevenson Clinch, CMA acting as a scribe for [name], MD.  Michele [name] is a 82 y.o. female who presents to [facility] at [facility] today for cont'd L knee and repeat Zilretta injection. On 05/29/22, she completed the Orthovisc series, 3/3, in her L knee. Pt was last seen by Dr. Denyse Amass on 06/28/22 and was given a L knee Zilretta injection.  Today, pt reports worsening knee pain over the past week. Swelling present. Sx causing night disturbance, worse at rest. Feels like the knee will give at times, no falls. Has been doing HEP provided by PT and swimming.   Dx imaging: 09/12/21 L knee XR   Pertinent review of systems: No fevers or chills  Relevant historical information: History of breast cancer.   Exam:  BP 136/76   Pulse 73   Ht 5\' 4"  (1.626 m)   Wt 157 lb (71.2 kg)   SpO2 96%   BMI 26.95 kg/m  General: Well Developed, well nourished, and in no acute distress.   MSK: Left knee mild effusion normal motion with crepitation.    Lab and Radiology Results   Zilretta injection left knee Procedure: Real-time Ultrasound Guided Injection of left knee joint superior lateral patellar space Device: Philips Affiniti 50G Images permanently stored and available for review in PACS Verbal informed consent obtained.  Discussed risks and benefits of procedure. Warned about infection, hyperglycemia bleeding, damage to structures among others. Patient expresses understanding and agreement Time-out conducted.   Noted no overlying erythema, induration, or other signs of local infection.   Skin prepped in a sterile fashion.   Local anesthesia: Topical Ethyl chloride.   With sterile technique and under real time ultrasound guidance: Zilretta 32 mg injected into knee joint. Fluid seen entering the joint capsule.   Completed without difficulty   Advised to call if fevers/chills, erythema, induration, drainage, or persistent bleeding.   Images permanently stored and  available for review in the ultrasound unit.  Impression: Technically successful ultrasound guided injection.  Lot number: [id number]     Assessment and Plan: 82 y.o. female with left knee pain due to exacerbation of DJD.  Previous Zilretta injection was just over 3 months ago.  Pain has returned.  Plan for repeat Zilretta injection today.  Ultimately these injections are likely to stop working and she will require knee replacement.  We talked about that today.  Okay to continue current plan.  Check back as needed.   PDMP not reviewed this encounter. Orders Placed This Encounter  Procedures   [place] LIMITED JOINT SPACE STRUCTURES LOW LEFT(NO LINKED CHARGES)    Order Specific Question:   Reason for Exam (SYMPTOM  OR DIAGNOSIS REQUIRED)    Answer:   left knee pain    Order Specific Question:   Preferred imaging location?    Answer:   Adult nurse [facility] ordered this encounter  Medications   Triamcinolone Acetonide (ZILRETTA) intra-articular injection 32 mg     Discussed warning signs or symptoms. Please see discharge instructions. Patient expresses understanding.   The above documentation has been reviewed and is accurate and complete [name], M.D.

## 2022-10-08 NOTE — Telephone Encounter (Signed)
Pt received Zilretta inj for LEFT knee OA on 10/03/22. Can consider repeat on or after 12/27/22.

## 2022-10-17 ENCOUNTER — Other Ambulatory Visit: Payer: Self-pay | Admitting: Cardiovascular Disease

## 2022-10-22 DIAGNOSIS — J3081 Allergic rhinitis due to animal (cat) (dog) hair and dander: Secondary | ICD-10-CM | POA: Diagnosis not present

## 2022-10-22 DIAGNOSIS — J3089 Other allergic rhinitis: Secondary | ICD-10-CM | POA: Diagnosis not present

## 2022-10-22 DIAGNOSIS — Z91018 Allergy to other foods: Secondary | ICD-10-CM | POA: Diagnosis not present

## 2022-10-26 ENCOUNTER — Ambulatory Visit
Admission: RE | Admit: 2022-10-26 | Discharge: 2022-10-26 | Disposition: A | Discharge status: Home / Self Care | Payer: Medicare PPO | Source: Ambulatory Visit | Attending: Hematology and Oncology | Admitting: Hematology and Oncology

## 2022-10-26 DIAGNOSIS — Z9889 Other specified postprocedural states: Secondary | ICD-10-CM

## 2022-10-26 DIAGNOSIS — Z853 Personal history of malignant neoplasm of breast: Secondary | ICD-10-CM | POA: Diagnosis not present

## 2022-10-26 DIAGNOSIS — R92323 Mammographic fibroglandular density, bilateral breasts: Secondary | ICD-10-CM | POA: Diagnosis not present

## 2022-10-30 DIAGNOSIS — Z91018 Allergy to other foods: Secondary | ICD-10-CM | POA: Diagnosis not present

## 2022-10-30 DIAGNOSIS — H40013 Open angle with borderline findings, low risk, bilateral: Secondary | ICD-10-CM | POA: Diagnosis not present

## 2022-10-30 NOTE — Telephone Encounter (Signed)
 VOB initiated for ORTHOVISC for LEFT knee OA.

## 2022-10-31 NOTE — Telephone Encounter (Addendum)
Orthovisc for LEFT knee OA OK to schedule on or after 11/29/22  Primary Insurance: [employer] Adv PPO Co-pay: $40 Co-insurance: n/a Deductible: does not apply Prior Auth NOT required   Knee Injection History:  09/12/21 - Kenalog LEFT 05/10/22 - Orthovisc #1 LEFT 05/18/22 - Orthovisc #2 LEFT 05/29/22 - Orthovisc #3 LEFT 06/28/22 - Zilretta LEFT 10/03/22 - Zilretta LEFT

## 2022-10-31 NOTE — Telephone Encounter (Signed)
 NO Prior Auth required for [employer]

## 2022-11-01 NOTE — Telephone Encounter (Signed)
Holding until needed.

## 2022-11-27 NOTE — Telephone Encounter (Signed)
VOB initiated for ZILRETTA for LEFT knee OA

## 2022-11-28 DIAGNOSIS — H02412 Mechanical ptosis of left eyelid: Secondary | ICD-10-CM | POA: Diagnosis not present

## 2022-11-28 DIAGNOSIS — H02421 Myogenic ptosis of right eyelid: Secondary | ICD-10-CM | POA: Diagnosis not present

## 2022-11-28 DIAGNOSIS — H02831 Dermatochalasis of right upper eyelid: Secondary | ICD-10-CM | POA: Diagnosis not present

## 2022-11-28 DIAGNOSIS — H02834 Dermatochalasis of left upper eyelid: Secondary | ICD-10-CM | POA: Diagnosis not present

## 2022-11-28 DIAGNOSIS — H02411 Mechanical ptosis of right eyelid: Secondary | ICD-10-CM | POA: Diagnosis not present

## 2022-11-28 DIAGNOSIS — H02423 Myogenic ptosis of bilateral eyelids: Secondary | ICD-10-CM | POA: Diagnosis not present

## 2022-11-28 DIAGNOSIS — H0279 Other degenerative disorders of eyelid and periocular area: Secondary | ICD-10-CM | POA: Diagnosis not present

## 2022-11-28 DIAGNOSIS — H02413 Mechanical ptosis of bilateral eyelids: Secondary | ICD-10-CM | POA: Diagnosis not present

## 2022-11-28 DIAGNOSIS — H57813 Brow ptosis, bilateral: Secondary | ICD-10-CM | POA: Diagnosis not present

## 2022-11-29 DIAGNOSIS — L570 Actinic keratosis: Secondary | ICD-10-CM | POA: Diagnosis not present

## 2022-11-29 DIAGNOSIS — Z85828 Personal history of other malignant neoplasm of skin: Secondary | ICD-10-CM | POA: Diagnosis not present

## 2022-11-29 DIAGNOSIS — L578 Other skin changes due to chronic exposure to nonionizing radiation: Secondary | ICD-10-CM | POA: Diagnosis not present

## 2022-11-29 NOTE — Telephone Encounter (Signed)
Prior Authorization initiated for [facility] via [email or web address] KEY: [id number]

## 2022-11-29 NOTE — Telephone Encounter (Signed)
Prior Auth REQUIRED for [employer]

## 2022-12-04 NOTE — Telephone Encounter (Signed)
Prior Auth for [facility] APPROVED PA# [id number] Valid: 12/22/22-06/22/23

## 2022-12-06 NOTE — Telephone Encounter (Signed)
Scheduled 10.31

## 2022-12-06 NOTE — Telephone Encounter (Signed)
Zilretta for LEFT knee OA   Primary Insurance: [employer] Medicare Adv [facility] Co-Pay: $40 Co-Insurance: 0% Deductible: does not apply  Prior Auth APPROVED PA# [id number] Valid: 12/22/22-06/22/23

## 2022-12-24 DIAGNOSIS — H53483 Generalized contraction of visual field, bilateral: Secondary | ICD-10-CM | POA: Diagnosis not present

## 2022-12-27 ENCOUNTER — Ambulatory Visit: Payer: Medicare PPO | Admitting: Family Medicine

## 2022-12-27 ENCOUNTER — Other Ambulatory Visit: Payer: Self-pay

## 2022-12-27 VITALS — BP 136/76 | HR 73 | Ht 64.0 in | Wt 157.0 lb

## 2022-12-27 DIAGNOSIS — G8929 Other chronic pain: Secondary | ICD-10-CM | POA: Diagnosis not present

## 2022-12-27 DIAGNOSIS — M1712 Unilateral primary osteoarthritis, left knee: Secondary | ICD-10-CM

## 2022-12-27 DIAGNOSIS — M25562 Pain in left knee: Secondary | ICD-10-CM | POA: Diagnosis not present

## 2022-12-27 MED ORDER — TRIAMCINOLONE ACETONIDE 32 MG IX SRER
32.0000 mg | Freq: Once | INTRA_ARTICULAR | Status: AC
  Administered 2022-12-27: 32 mg via INTRA_ARTICULAR

## 2022-12-27 NOTE — Progress Notes (Signed)
   Rubin Payor, PhD, LAT, ATC acting as a scribe for [name], MD.  Michele [name] is a 82 y.o. female who presents to [facility] at [facility] today for cont'd L knee and repeat Zilretta injection. On 05/29/22, she completed the Orthovisc series, 3/3, in her L knee. Pt was last seen by Dr. Denyse Amass on 10/03/22 and was given a L knee Zilretta injection.  Today, pt reports she has been on her foot a lot lately taking care of her husband. L knee pain has gradually started to return over the last couple days. She notes she is very against the idea of surgery.  Of note she is happy today because the [employer] just won the [employer].  She was a fan of the Dodgers growing up in [place] as a kid.  Dx imaging: 09/12/21 L knee XR   Pertinent review of systems: No fevers or chills  Relevant historical information: Breast cancer   Exam:  BP 136/76   Pulse 73   Ht 5\' 4"  (1.626 m)   Wt 157 lb (71.2 kg)   SpO2 96%   BMI 26.95 kg/m  General: Well Developed, well nourished, and in no acute distress.   MSK: Left knee mild effusion normal motion.      Zilretta injection left knee Procedure: Real-time Ultrasound Guided Injection of left knee joint superior lateral patellar space Device: Philips Affiniti 50G Images permanently stored and available for review in PACS Verbal informed consent obtained.  Discussed risks and benefits of procedure. Warned about infection, hyperglycemia bleeding, damage to structures among others. Patient expresses understanding and agreement Time-out conducted.   Noted no overlying erythema, induration, or other signs of local infection.   Skin prepped in a sterile fashion.   Local anesthesia: Topical Ethyl chloride.   With sterile technique and under real time ultrasound guidance: Zilretta 32 mg injected into knee joint. Fluid seen entering the joint capsule.   Completed without difficulty   Advised to call if fevers/chills, erythema,  induration, drainage, or persistent bleeding.   Images permanently stored and available for review in the ultrasound unit.  Impression: Technically successful ultrasound guided injection.  Lot number: [id number]      Assessment and Plan: 82 y.o. female with left knee pain due to DJD.  Plan for repeat Zilretta injection.  It has just just over 12 weeks.  Recheck back as needed.   PDMP not reviewed this encounter. Orders Placed This Encounter  Procedures   [place] LIMITED JOINT SPACE STRUCTURES LOW LEFT(NO LINKED CHARGES)    Order Specific Question:   Reason for Exam (SYMPTOM  OR DIAGNOSIS REQUIRED)    Answer:   eval knee inj    Order Specific Question:   Preferred imaging location?    Answer:   Adult nurse [facility] ordered this encounter  Medications   Triamcinolone Acetonide (ZILRETTA) intra-articular injection 32 mg     Discussed warning signs or symptoms. Please see discharge instructions. Patient expresses understanding.   The above documentation has been reviewed and is accurate and complete [name], M.D.

## 2022-12-27 NOTE — Patient Instructions (Signed)
Thank you for coming in today.   You received an injection today. Seek immediate medical attention if the joint becomes red, extremely painful, or is oozing fluid.   Check back as needed 

## 2022-12-28 NOTE — Telephone Encounter (Addendum)
Pt received Zilretta inj for LEFT knee OA on 12/27/22.  Can consider repeat inj on or after 03/22/23.

## 2023-01-28 NOTE — Telephone Encounter (Signed)
Pt received Zilretta inj 12/27/22.

## 2023-02-05 DIAGNOSIS — M810 Age-related osteoporosis without current pathological fracture: Secondary | ICD-10-CM | POA: Diagnosis not present

## 2023-02-25 ENCOUNTER — Telehealth: Payer: Self-pay | Admitting: Family Medicine

## 2023-02-25 NOTE — Telephone Encounter (Signed)
Pt states she was asked by [place] to call to re-verify Zilretta L knee for 2025. Pt informed we cannot do this until the New Year.  Please call for scheduling when done.

## 2023-02-25 NOTE — Telephone Encounter (Signed)
Michele [name] R1 hour ago (10:02 AM)   JJ Pt states she was asked by [place] to call to re-verify Zilretta L knee for 2025. Pt informed we cannot do this until the New Year.   Please call for scheduling when done.

## 2023-02-25 NOTE — Telephone Encounter (Signed)
Can consider repeat inj on or after 03/22/23.   Will re-run benefits on or after 02/27/23.

## 2023-02-28 NOTE — Telephone Encounter (Signed)
VOB initiated for ZILRETTA for LEFT knee OA

## 2023-03-04 ENCOUNTER — Other Ambulatory Visit (HOSPITAL_BASED_OUTPATIENT_CLINIC_OR_DEPARTMENT_OTHER): Payer: Self-pay

## 2023-03-04 ENCOUNTER — Other Ambulatory Visit (HOSPITAL_COMMUNITY): Payer: Self-pay

## 2023-03-04 MED ORDER — TRIAMCINOLONE ACETONIDE 32 MG IX SRER
INTRA_ARTICULAR | 0 refills | Status: AC
  Filled 2023-03-04: qty 1, fill #0
  Filled 2023-03-06: qty 1, 28d supply, fill #0

## 2023-03-04 NOTE — Addendum Note (Signed)
 Addended by: Dierdre Searles on: 03/04/2023 12:31 PM   Modules accepted: Orders

## 2023-03-04 NOTE — Telephone Encounter (Signed)
 [employer] [facility] [employer] - Prior Authorization REQUIRED  [facility] - Prior Authorization NOT required

## 2023-03-05 ENCOUNTER — Other Ambulatory Visit (HOSPITAL_COMMUNITY): Payer: Self-pay

## 2023-03-06 ENCOUNTER — Encounter (HOSPITAL_COMMUNITY): Payer: Self-pay | Admitting: Pharmacist

## 2023-03-06 ENCOUNTER — Other Ambulatory Visit (HOSPITAL_BASED_OUTPATIENT_CLINIC_OR_DEPARTMENT_OTHER): Payer: Self-pay

## 2023-03-06 ENCOUNTER — Other Ambulatory Visit: Payer: Self-pay

## 2023-03-06 ENCOUNTER — Encounter (HOSPITAL_COMMUNITY): Payer: Self-pay

## 2023-03-06 ENCOUNTER — Other Ambulatory Visit (HOSPITAL_COMMUNITY): Payer: Self-pay

## 2023-03-06 NOTE — Telephone Encounter (Signed)
VOB initiated for Orthovisc for LEFT knee OA.  

## 2023-03-06 NOTE — Progress Notes (Signed)
 Specialty Pharmacy Initial Fill Coordination Note  Michele [name] is a 83 y.o. female contacted today regarding initial fill of specialty medication(s) Triamcinolone  Acetonide (ZILRETTA )   Patient requested Courier to Provider Office   Delivery date: 03/11/23   [facility]   Medication will be filled on 1/10.   Patient is aware of $64 copayment.

## 2023-03-07 ENCOUNTER — Other Ambulatory Visit: Payer: Self-pay

## 2023-03-08 ENCOUNTER — Other Ambulatory Visit: Payer: Self-pay

## 2023-03-13 ENCOUNTER — Other Ambulatory Visit: Payer: Self-pay

## 2023-03-13 DIAGNOSIS — M810 Age-related osteoporosis without current pathological fracture: Secondary | ICD-10-CM | POA: Diagnosis not present

## 2023-03-15 NOTE — Telephone Encounter (Signed)
Patient is scheduled for [facility]

## 2023-03-15 NOTE — Telephone Encounter (Signed)
 [employer] - Prior Authorization NOT required

## 2023-03-15 NOTE — Telephone Encounter (Signed)
Orthovisc for LEFT knee OA   [employer]  Primary Insurance: Humana Medicare Adv [facility] Co-pay: $40 Co-insurance: undisclosed Deductible: does not apply Prior Auth: NOT required   Knee Injection History 09/12/21 - Kenalog LEFT 05/10/22 - Orthovisc #1 LEFT 05/18/22 - Orthovisc #2 LEFT 05/29/22 - Orthovisc #3 LEFT 06/28/22 - Zilretta LEFT 10/03/22 - Zilretta LEFT 12/27/22 - Zilretta LEFT

## 2023-03-19 NOTE — Telephone Encounter (Signed)
Prior Authorization initiated for [facility] via [email or web address] KEY: [id number]

## 2023-03-20 NOTE — Telephone Encounter (Signed)
[employer] [facility] [employer]  Prior Authorization for Zilretta for LEFT knee OA APPROVED PA# [id number] Valid: 12/22/22-06/22/23

## 2023-03-21 NOTE — Telephone Encounter (Signed)
ZILRETTA for LEFT knee OA   [employer]  Primary Insurance: [employer] Medicare Adv Co-pay: $40 Co-insurance: undisclosed Deductible: does not apply Prior Auth: APPROVED PA# [id number] Valid: 12/22/22-06/22/23   Knee Injection History 09/12/21 - Kenalog LEFT 05/10/22 - Orthovisc #1 LEFT 05/18/22 - Orthovisc #2 LEFT 05/29/22 - Orthovisc #3 LEFT 06/28/22 - Zilretta LEFT 10/03/22 - Zilretta LEFT 12/27/22 - Zilretta LEFT

## 2023-03-21 NOTE — Telephone Encounter (Signed)
Patient already scheduled and supplied by the pharmacy.

## 2023-03-22 NOTE — Progress Notes (Unsigned)
   Rubin Payor, PhD, LAT, ATC acting as a scribe for [name], MD.  Michele [name] is a 83 y.o. female who presents to [facility] at [facility] today for exacerbation of her L knee pain. Pt was last seen by Dr. Denyse Amass on 12/27/22 and was given a L knee Zilretta injection.  Today, pt reports ****  Dx imaging: 09/12/21 L knee XR   Pertinent review of systems: ***  Relevant historical information: ***   Exam:  There were no vitals taken for this visit. General: Well Developed, well nourished, and in no acute distress.   MSK: ***    Lab and Radiology Results No results found for this or any previous visit (from the past 72 hours). No results found.     Assessment and Plan: 83 y.o. female with ***   PDMP not reviewed this encounter. No orders of the defined types were placed in this encounter.  No orders of the defined types were placed in this encounter.    Discussed warning signs or symptoms. Please see discharge instructions. Patient expresses understanding.   ***

## 2023-03-25 ENCOUNTER — Ambulatory Visit: Payer: Medicare PPO | Admitting: Family Medicine

## 2023-03-25 ENCOUNTER — Encounter: Payer: Self-pay | Admitting: Family Medicine

## 2023-03-25 ENCOUNTER — Other Ambulatory Visit: Payer: Self-pay

## 2023-03-25 VITALS — BP 142/82 | HR 75 | Ht 64.0 in | Wt 156.0 lb

## 2023-03-25 DIAGNOSIS — G8929 Other chronic pain: Secondary | ICD-10-CM

## 2023-03-25 DIAGNOSIS — M25562 Pain in left knee: Secondary | ICD-10-CM

## 2023-03-25 DIAGNOSIS — M1712 Unilateral primary osteoarthritis, left knee: Secondary | ICD-10-CM | POA: Diagnosis not present

## 2023-03-25 MED ORDER — TRIAMCINOLONE ACETONIDE 32 MG IX SRER
32.0000 mg | Freq: Once | INTRA_ARTICULAR | Status: AC
  Administered 2023-03-25: 32 mg via INTRA_ARTICULAR

## 2023-03-25 NOTE — Patient Instructions (Signed)
Thank you for coming in today.   You received an injection today. Seek immediate medical attention if the joint becomes red, extremely painful, or is oozing fluid.

## 2023-03-26 NOTE — Telephone Encounter (Signed)
Pt received Zilretta injection for LEFT knee OA on 03/25/23.  Can consider repeat injection on or after 06/18/23.

## 2023-03-26 NOTE — Telephone Encounter (Deleted)
Marland Kitchen

## 2023-04-01 ENCOUNTER — Ambulatory Visit: Payer: Medicare PPO | Attending: Cardiovascular Disease | Admitting: Cardiovascular Disease

## 2023-04-01 ENCOUNTER — Encounter: Payer: Self-pay | Admitting: Cardiovascular Disease

## 2023-04-01 VITALS — BP 156/64 | HR 63 | Ht 64.0 in | Wt 153.0 lb

## 2023-04-01 DIAGNOSIS — R931 Abnormal findings on diagnostic imaging of heart and coronary circulation: Secondary | ICD-10-CM | POA: Diagnosis not present

## 2023-04-01 DIAGNOSIS — E782 Mixed hyperlipidemia: Secondary | ICD-10-CM

## 2023-04-01 NOTE — Progress Notes (Unsigned)
04/01/2023 Stark Jock   01/18/1941  [id number]  Primary Physician Merri Brunette, MD Primary Cardiologist: Runell Gess MD Nicholes Calamity, [place]  HPI:  Michele [name] is a 83 y.o.  mildly overweight married Caucasian female mother of 2, grandmother 2 grandchildren whose husband Michele [name] is also a patient of mine.  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  I last saw her in the office 03/13/2022. She does have a family history of heart disease with a father who died of a myocardial infarction at age 60.  She has no other risk factors other than hyperlipidemia on low-dose rosuvastatin although she seems to be statin intolerant.  She is very active and does swimming, yoga and works in her yard outside without symptoms.  Recent coronary calcium score ordered by Dr. Renne Crigler was 691 with calcium in all 3 coronary arteries although she is completely asymptomatic.   She had a Myoview stress test on her 10/15/2019 which was completely normal.  Her lipid profile is markedly improved on Repatha.  Since I saw her a year ago she continues to well.  She is fairly active.  She denies chest pain or shortness of breath.   Current Meds  Medication Sig   aspirin EC 81 MG tablet Take 81 mg by mouth daily. Swallow whole.   Cholecalciferol (VITAMIN D3) 1000 units CAPS Take by mouth daily.   Citalopram Hydrobromide (CELEXA PO) Take 10 mg by mouth as needed.   EPINEPHrine (EPIPEN 2-PAK) 0.3 mg/0.3 mL IJ SOAJ injection Inject 0.3 mg into the muscle once as needed (for severe allergic reaction). CAll 911 immediately if you have to use this medicine   fluticasone (VERAMYST) 27.5 MCG/SPRAY nasal spray Place 2 sprays into the nose as needed.   REPATHA SURECLICK 140 MG/ML SOAJ Inject 140 mg into the skin every 14 (fourteen) days.   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, 02/15/2020 states she has been eating meat for the last year and alpha gal seems to have gone away   Influenza Virus Vaccine Other (See Comments)   Influenza Vaccines Rash    'flu like symptoms for 24 hrs'    Social History   Socioeconomic History   Marital status: Married    Spouse name: Not on file   Number of children: 2   Years of education: Not on file   Highest education level: Not on file  Occupational History   Occupation: retired Catering manager  at [employer]                                        Tobacco Use   Smoking status: Former    Current packs/day: 0.00    Types: Cigarettes    Start date: 07/18/1956    Quit date: 07/19/1982    Years since quitting: 40.7   Smokeless tobacco: Never  Vaping Use   Vaping status: Never Used  Substance and Sexual Activity   Alcohol use: Yes    Alcohol/week: 2.0 standard drinks of alcohol    Types: 1 Glasses of wine, 1 Shots of liquor per week  Comment: 2 in the eveing before dinner   Drug use: No   Sexual activity: Never  Other Topics Concern   Not on file  Social History Narrative   Married 1 son 1 daughter   Retired Higher education careers adviser   2 alcoholic beverages daily former smoker no caffeine no drug use no tobacco now   Social Drivers of Corporate investment banker Strain: Not on [employer] Insecurity: Not on file  Transportation Needs: No Transportation Needs (03/06/2022)   PRAPARE - Administrator, Civil Service (Medical): No    Lack of Transportation (Non-Medical): No  Physical Activity: Not on file  Stress: Not on file  Social Connections: Unknown (07/11/2021)   Received from [facility]   Social Network    Social Network: Not on file  Intimate Partner Violence: Unknown (06/02/2021)   Received from [facility], [facility]   HITS    Physically  Hurt: Not on file    Insult or Talk Down To: Not on file    Threaten Physical Harm: Not on file    Scream or Curse: Not on file     Review of Systems: General: negative for chills, fever, night sweats or weight changes.  Cardiovascular: negative for chest pain, dyspnea on exertion, edema, orthopnea, palpitations, paroxysmal nocturnal dyspnea or shortness of breath Dermatological: negative for rash Respiratory: negative for cough or wheezing Urologic: negative for hematuria Abdominal: negative for nausea, vomiting, diarrhea, bright red blood per rectum, melena, or hematemesis Neurologic: negative for visual changes, syncope, or dizziness All other systems reviewed and are otherwise negative except as noted above.    Blood pressure (!) 156/64, pulse 63, height 5\' 4"  (1.626 m), weight 153 lb (69.4 kg), SpO2 95%.  General appearance: alert and no distress Neck: no adenopathy, no carotid bruit, no JVD, supple, symmetrical, trachea midline, and thyroid not enlarged, symmetric, no tenderness/mass/nodules Lungs: clear to auscultation bilaterally Heart: regular rate and rhythm, S1, S2 normal, no murmur, click, rub or gallop Extremities: extremities normal, atraumatic, no cyanosis or edema Pulses: 2+ and symmetric Skin: Skin color, texture, turgor normal. No rashes or lesions Neurologic: Grossly normal  EKG EKG Interpretation Date/Time:  Monday April 01 2023 14:44:50 EST Ventricular Rate:  63 PR Interval:  162 QRS Duration:  86 QT Interval:  408 QTC Calculation: 417 R Axis:   -46  Text Interpretation: Sinus rhythm with Fusion complexes Left axis deviation No previous ECGs available Confirmed by Nanetta Batty [phone]) on 04/01/2023 2:45:57 PM    ASSESSMENT AND PLAN:   Hyperlipidemia History of hyperlipidemia on Repatha with lipid profile performed 08/16/2022 revealing total cholesterol 146, LDL 47 and HDL of 83.  Elevated coronary artery calcium score Elevated coronary calcium  score of 691 with subsequent Myoview stress test performed 10/15/2019 which was completely normal.  She is active and is asymptomatic.     Runell Gess MD FACP,FACC,FAHA, [facility] 04/01/2023 2:56 PM

## 2023-04-01 NOTE — Patient Instructions (Signed)

## 2023-04-01 NOTE — Assessment & Plan Note (Signed)
History of hyperlipidemia on Repatha with lipid profile performed 08/16/2022 revealing total cholesterol 146, LDL 47 and HDL of 83.

## 2023-04-01 NOTE — Assessment & Plan Note (Signed)
Elevated coronary calcium score of 691 with subsequent Myoview stress test performed 10/15/2019 which was completely normal.  She is active and is asymptomatic.

## 2023-04-23 ENCOUNTER — Other Ambulatory Visit (HOSPITAL_COMMUNITY): Payer: Self-pay

## 2023-04-24 ENCOUNTER — Other Ambulatory Visit: Payer: Self-pay

## 2023-04-30 ENCOUNTER — Other Ambulatory Visit: Payer: Self-pay

## 2023-05-07 ENCOUNTER — Other Ambulatory Visit: Payer: Self-pay

## 2023-05-09 ENCOUNTER — Inpatient Hospital Stay: Payer: Medicare PPO | Attending: Adult Health | Admitting: Adult Health

## 2023-05-09 ENCOUNTER — Other Ambulatory Visit: Payer: Self-pay

## 2023-05-09 ENCOUNTER — Encounter: Payer: Self-pay | Admitting: Adult Health

## 2023-05-09 VITALS — BP 156/66 | HR 78 | Temp 97.8°F | Resp 17 | Ht 64.0 in | Wt 156.0 lb

## 2023-05-09 DIAGNOSIS — Z87891 Personal history of nicotine dependence: Secondary | ICD-10-CM | POA: Diagnosis not present

## 2023-05-09 DIAGNOSIS — Z170 Estrogen receptor positive status [ER+]: Secondary | ICD-10-CM | POA: Insufficient documentation

## 2023-05-09 DIAGNOSIS — C50411 Malignant neoplasm of upper-outer quadrant of right female breast: Secondary | ICD-10-CM | POA: Diagnosis not present

## 2023-05-09 DIAGNOSIS — Z1721 Progesterone receptor positive status: Secondary | ICD-10-CM | POA: Diagnosis not present

## 2023-05-09 DIAGNOSIS — Z803 Family history of malignant neoplasm of breast: Secondary | ICD-10-CM | POA: Diagnosis not present

## 2023-05-09 DIAGNOSIS — R21 Rash and other nonspecific skin eruption: Secondary | ICD-10-CM | POA: Diagnosis not present

## 2023-05-09 DIAGNOSIS — Z923 Personal history of irradiation: Secondary | ICD-10-CM | POA: Insufficient documentation

## 2023-05-09 DIAGNOSIS — Z79811 Long term (current) use of aromatase inhibitors: Secondary | ICD-10-CM | POA: Insufficient documentation

## 2023-05-09 DIAGNOSIS — Z1732 Human epidermal growth factor receptor 2 negative status: Secondary | ICD-10-CM | POA: Insufficient documentation

## 2023-05-09 NOTE — Progress Notes (Unsigned)
 [facility] Cancer Follow up:    Merri Brunette, MD [address] [place] [address]   DIAGNOSIS: Cancer Staging  Ductal carcinoma in situ (DCIS) of left breast Staging form: Breast, AJCC 8th Edition - Clinical stage from 10/09/2017: Stage 0 (cTis (DCIS), cN0, cM0, G3, ER-, PR-) - Unsigned Histologic grading system: 3 grade system Laterality: Left Staged by: Pathologist and managing physician Stage used in treatment planning: Yes National guidelines used in treatment planning: Yes Type of national guideline used in treatment planning: NCCN - Pathologic: Stage 0 (pTis (DCIS), pN0, cM0, ER-, PR-) - Unsigned  Malignant neoplasm of upper-outer quadrant of right breast in female, estrogen receptor positive (HCC) Staging form: Breast, AJCC 8th Edition - Clinical stage from 12/08/2020: Stage IA (cT1a, cN0, cM0, G2, ER+, PR+, HER2-) - Signed by Serena Croissant, MD on 01/12/2021 Stage prefix: Initial diagnosis Method of lymph node assessment: Clinical Histologic grading system: 3 grade system   SUMMARY OF ONCOLOGIC HISTORY: Oncology History  Ductal carcinoma in situ (DCIS) of left breast  09/30/2017 Initial Diagnosis   Screening detected left breast calcifications 2 groups upper outer quadrant 1.5 cm and 1.2 cm both of which are biopsy-proven high-grade DCIS with calcifications ER PR negative.  Underneath the nipple 2 groups of calcifications.  Posterior group was biopsy-proven fibrocystic change anterior group could not be biopsied.  Tis NX stage 0   11/08/2017 Surgery   2 left lumpectomies: DCIS high-grade 0.9 cm focally less than 0.1 cm to medial margin; second lumpectomy left subareolar: DCIS with calcifications high-grade 1.2 cm focally less than 0.1 cm superior margin and focally,  0.1 cm to anterior margin ER 0%, PR 0% for both Tis NX stage 0   12/18/2017 - 01/13/2018 Radiation Therapy   Adjuvant radiation therapy   01/03/2021 Genetic Testing   Negative  hereditary cancer genetic testing: no pathogenic variants detected in [facility] CustomNext-Cancer +RNAinsight Panel.  The report date is January 03, 2021.   The CustomNext-Cancer+RNAinsight panel offered by [employer] [facility] includes sequencing and rearrangement analysis for the following 47 genes:  APC, ATM, AXIN2, BARD1, BMPR1A, BRCA1, BRCA2, BRIP1, CDH1, CDK4, CDKN2A, CHEK2, DICER1, EPCAM, GREM1, HOXB13, MEN1, MLH1, MSH2, MSH3, MSH6, MUTYH, NBN, NF1, NF2, NTHL1, PALB2, PMS2, POLD1, POLE, PTEN, RAD51C, RAD51D, RECQL, RET, SDHA, SDHAF2, SDHB, SDHC, SDHD, SMAD4, SMARCA4, STK11, TP53, TSC1, TSC2, and VHL.  RNA data is routinely analyzed for use in variant interpretation for all genes.   Malignant neoplasm of upper-outer quadrant of right breast in female, estrogen receptor positive (HCC)  11/07/2020 Pathology Results   Screening mammogram detected right breast abnormality: Right breast biopsy 9 o'clock position: Grade 1 through 2 IDC ER 100%, PR 95%, Ki-67 5%, HER2 2+ IHC, FISH negative ratio 1.5   12/08/2020 Cancer Staging   Staging form: Breast, AJCC 8th Edition - Clinical stage from 12/08/2020: Stage IA (cT1a, cN0, cM0, G2, ER+, PR+, HER2-) - Signed by Serena Croissant, MD on 01/12/2021 Stage prefix: Initial diagnosis Method of lymph node assessment: Clinical Histologic grading system: 3 grade system   01/03/2021 Genetic Testing   Negative hereditary cancer genetic testing: no pathogenic variants detected in [facility]Next-Cancer +RNAinsight Panel.  The report date is January 03, 2021.   The CustomNext-Cancer+RNAinsight panel offered by [employer] [facility] includes sequencing and rearrangement analysis for the following 47 genes:  APC, ATM, AXIN2, BARD1, BMPR1A, BRCA1, BRCA2, BRIP1, CDH1, CDK4, CDKN2A, CHEK2, DICER1, EPCAM, GREM1, HOXB13, MEN1, MLH1, MSH2, MSH3, MSH6, MUTYH, NBN, NF1, NF2, NTHL1, PALB2, PMS2,  POLD1, POLE, PTEN, RAD51C, RAD51D, RECQL, RET, SDHA, SDHAF2, SDHB, SDHC, SDHD, SMAD4, SMARCA4, STK11,  TP53, TSC1, TSC2, and VHL.  RNA data is routinely analyzed for use in variant interpretation for all genes.   01/03/2021 Surgery   Right lumpectomy: Grade 2 IDC 1.1 cm, intermediate grade DCIS, LCIS, margins negative, ER 100%, PR 95%, HER2 negative, Ki-67 5%     CURRENT THERAPY: observation  INTERVAL HISTORY: Michele [name] 83 y.o. female returns for    Patient Active Problem List   Diagnosis Date Noted   Genetic testing 01/09/2021   Family history of breast cancer 12/19/2020   Malignant neoplasm of upper-outer quadrant of right breast in female, estrogen receptor positive (HCC) 12/08/2020   Statin myopathy 10/09/2019   Hyperlipidemia 10/06/2019   Elevated coronary artery calcium score 10/06/2019   Ductal carcinoma in situ (DCIS) of left breast 09/30/2017   Alpha gal hypersensitivity 11/22/2015    is allergic to azithromycin, alpha-gal, influenza virus vaccine, and influenza vaccines.  MEDICAL HISTORY: Past Medical History:  Diagnosis Date   Allergic rhinitis    Allergy to alpha-gal    Anal fissure    Anxiety    Asthma    only as a child; went away by age [age]   Breast cancer (HCC)    Calcified granuloma of lung    Cancer of the skin, basal cell    Depression    Family history of breast cancer 12/19/2020   Hyperglycemia    Hyperlipemia    Hyperlipidemia    IBS (irritable bowel syndrome)    Nicotine dependence    Osteopenia    Personal history of radiation therapy    Seasonal affective disorder (HCC)    Seasonal allergies    Status post dilation of esophageal narrowing     SURGICAL HISTORY: Past Surgical History:  Procedure Laterality Date   BREAST BIOPSY Left 08/2017   BREAST LUMPECTOMY Left 10/2017   BREAST LUMPECTOMY WITH NEEDLE LOCALIZATION Left 11/08/2017   Procedure: LEFT BREAST SUBAREOLAR BIOPSY WITH NEEDLE LOCALIZATION;  Surgeon: Claud Kelp, MD;  Location: [facility];  Service: General;  Laterality: Left;   BREAST LUMPECTOMY WITH  RADIOACTIVE SEED LOCALIZATION Left 11/08/2017   Procedure: LEFT BREAST LUMPECTOMY WITH RADIOACTIVE SEED LOCALIZATION X 2;  Surgeon: Claud Kelp, MD;  Location: [facility];  Service: General;  Laterality: Left;   BREAST LUMPECTOMY WITH RADIOACTIVE SEED LOCALIZATION Right 01/03/2021   Procedure: RIGHT BREAST LUMPECTOMY WITH RADIOACTIVE SEED LOCALIZATION;  Surgeon: Harriette Bouillon, MD;  Location:  SURGERY CENTER;  Service: General;  Laterality: Right;   BROW LIFT     CATARACT EXTRACTION Bilateral    COLONOSCOPY  2010   Incomplete PE negative redundant colon   TONSILLECTOMY     age 55    SOCIAL HISTORY: Social History   Socioeconomic History   Marital status: Married    Spouse name: Not on file   Number of children: 2   Years of education: Not on file   Highest education level: Not on file  Occupational History   Occupation: retired Catering manager  at [employer]                                        Tobacco Use   Smoking status: Former    Current packs/day: 0.00    Types: Cigarettes    Start date: 07/18/1956    Quit  date: 07/19/1982    Years since quitting: 40.8   Smokeless tobacco: Never  Vaping Use   Vaping status: Never Used  Substance and Sexual Activity   Alcohol use: Yes    Alcohol/week: 2.0 standard drinks of alcohol    Types: 1 Glasses of wine, 1 Shots of liquor per week    Comment: 2 in the eveing before dinner   Drug use: No   Sexual activity: Never  Other Topics Concern   Not on file  Social History Narrative   Married 1 son 1 daughter   Retired Higher education careers adviser   2 alcoholic beverages daily former smoker no caffeine no drug use no tobacco now   Social Drivers of Corporate investment banker Strain: Not on file  Food Insecurity: Not on file  Transportation Needs: No Transportation Needs (03/06/2022)   PRAPARE - Administrator, Civil Service (Medical): No    Lack of Transportation (Non-Medical): No  Physical  Activity: Not on file  Stress: Not on file  Social Connections: Unknown (07/11/2021)   [facility] from [facility]   Social Network    Social Network: Not on file  Intimate Partner Violence: Unknown (06/02/2021)   Received [facility], [facility]   HITS    Physically Hurt: Not on file    Insult or Talk Down To: Not on file    Threaten Physical Harm: Not on file    Scream or Curse: Not on file    FAMILY HISTORY: Family History  Problem Relation Age of Onset   Asthma Mother    COPD Mother    Heart attack Father    Cancer Sister        maternal half sister; type unknown; thigh?   Breast cancer Sister        maternal half sister; two primaries; mid-late 55s   COPD Sister    Thyroid disease Daughter    Breast cancer Niece        dx before 97    Review of Systems - Oncology    PHYSICAL EXAMINATION    Vitals:   05/09/23 1405  BP: (!) 156/66  Pulse: 78  Resp: 17  Temp: 97.8 F (36.6 C)  SpO2: 100%    Physical Exam  LABORATORY DATA:  CBC    Component Value Date/Time   WBC 4.8 01/03/2021 0628   RBC 4.80 01/03/2021 0628   HGB 15.2 (H) 01/03/2021 0628   HGB 14.3 10/09/2017 1250   HGB 14.5 11/17/2014 1250   HCT 46.5 (H) 01/03/2021 0628   HCT 43.0 11/17/2014 1250   PLT 224 01/03/2021 0628   PLT 259 10/09/2017 1250   PLT 246 11/17/2014 1250   MCV 96.9 01/03/2021 0628   MCV 95 11/17/2014 1250   MCH 31.7 01/03/2021 0628   MCHC 32.7 01/03/2021 0628   RDW 13.4 01/03/2021 0628   RDW 14.4 11/17/2014 1250   LYMPHSABS 1.5 01/03/2021 0628   LYMPHSABS 2.1 11/17/2014 1250   MONOABS 0.4 01/03/2021 0628   EOSABS 0.2 01/03/2021 0628   EOSABS 0.2 11/17/2014 1250   BASOSABS 0.0 01/03/2021 0628   BASOSABS 0.0 11/17/2014 1250    CMP     Component Value Date/Time   NA 140 01/03/2021 0628   NA 139 11/17/2014 1250   K 3.9 01/03/2021 0628   CL 104 01/03/2021 0628   CO2 28 01/03/2021 0628   GLUCOSE 113 (H) 01/03/2021 0628   BUN 11 01/03/2021  1610  BUN 13 11/17/2014 1250   CREATININE 0.83 01/03/2021 0628   CREATININE 0.88 10/09/2017 1250   CALCIUM 9.2 01/03/2021 0628   PROT 6.6 03/13/2022 0926   ALBUMIN 4.5 03/13/2022 0926   AST 18 03/13/2022 0926   AST 16 10/09/2017 1250   ALT 11 03/13/2022 0926   ALT 10 10/09/2017 1250   ALKPHOS 57 03/13/2022 0926   BILITOT 0.4 03/13/2022 0926   BILITOT 0.6 10/09/2017 1250   GFRNONAA >60 01/03/2021 0628   GFRNONAA >60 10/09/2017 1250   GFRAA >60 10/09/2017 1250       PENDING LABS:   RADIOGRAPHIC STUDIES:  No results found.   PATHOLOGY:     ASSESSMENT and THERAPY PLAN:   No problem-specific Assessment & Plan notes found for this encounter.   No orders of the defined types were placed in this encounter.   All questions were answered. The patient knows to call the clinic with any problems, questions or concerns. We can certainly see the patient much sooner if necessary. This note was electronically signed. Noreene Filbert, NP 05/09/2023

## 2023-05-10 ENCOUNTER — Other Ambulatory Visit: Payer: Self-pay

## 2023-05-10 NOTE — Assessment & Plan Note (Signed)
 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 Adjuvant antiestrogen therapy with letrozole 2.5 mg daily x5 years started November 2022   Breast cancer follow-up No malignancy on last mammogram. No signs of recurrence. She understands the importance of early detection. - Continue annual mammograms. - Advise to report any new breast changes immediately. - Schedule follow-up in one year.  Aspirin-induced skin changes Red blotchy skin and increased bruising likely due to aspirin. Discontinued aspirin and doing well under cardiology care. - Discontinue aspirin.  Calcium deposits Under cardiology care, taking Repatha with no concerns. - Continue Repatha as prescribed.  Exercise and weight management Engages in gardening and walking. Desires weight loss and aware of dietary changes needed. - Encourage regular physical activity. - Advise on dietary modifications to support weight loss.  RTC in one year for continued long term surveillance

## 2023-05-10 NOTE — Progress Notes (Signed)
 Disenrolling - per patient she typically request medication from [facility] when she needs another injection and they obtain the medication.Patient states she is unsure why the pharmacy is involved. Reminded patient medication was filled through the pharmacy in January for $64. Advised patient to follow-up with [facility] and if she needs future fills through the pharmacy she can contact us.

## 2023-05-28 NOTE — Telephone Encounter (Signed)
 Will wait for patient to state she wants the injection to re-run. Patient does not have a follow up with [place]

## 2023-05-29 ENCOUNTER — Telehealth: Payer: Self-pay | Admitting: Family Medicine

## 2023-05-29 NOTE — Telephone Encounter (Signed)
 Patient is scheduled for repeat Zilretta on 06/19/23. Can you confirm authorization is good?

## 2023-05-29 NOTE — Telephone Encounter (Signed)
Scheduled for 4/23 °

## 2023-05-30 DIAGNOSIS — L814 Other melanin hyperpigmentation: Secondary | ICD-10-CM | POA: Diagnosis not present

## 2023-05-30 DIAGNOSIS — L821 Other seborrheic keratosis: Secondary | ICD-10-CM | POA: Diagnosis not present

## 2023-05-30 DIAGNOSIS — Z85828 Personal history of other malignant neoplasm of skin: Secondary | ICD-10-CM | POA: Diagnosis not present

## 2023-06-05 NOTE — Telephone Encounter (Signed)
 [facility] called asking for clinical notes to be uploaded to the portal

## 2023-06-06 NOTE — Telephone Encounter (Signed)
 Uploaded clinicals through [facility]

## 2023-06-07 ENCOUNTER — Telehealth: Payer: Self-pay

## 2023-06-07 NOTE — Telephone Encounter (Signed)
**  Patient is scheduled for 06/19/23 but cannot get the injection until 06/23/23**  Zilretta authorized for Left knee Patient is responsible for a $40 copay for CPT code 16109  remaining covered at 100% by the payer at contracted rate.  Deductibles do not apply to these services.  Patient has a $40 copay whether or not an office visit is billed.  Only one copay applies per date of service. Patient has an out of pocket maximum of $4000 and has accumulated $100.  If out of pocket is met, copays will no longer apply  Authorization approve Authorization number [id number] 06/23/2023-12/23/2023

## 2023-06-10 NOTE — Telephone Encounter (Signed)
 Left message for patient to call back to schedule.

## 2023-06-19 ENCOUNTER — Ambulatory Visit: Admitting: Family Medicine

## 2023-06-24 ENCOUNTER — Other Ambulatory Visit: Payer: Self-pay

## 2023-06-24 ENCOUNTER — Ambulatory Visit: Admitting: Family Medicine

## 2023-06-24 VITALS — BP 134/76 | HR 71 | Ht 64.0 in | Wt 156.0 lb

## 2023-06-24 DIAGNOSIS — M722 Plantar fascial fibromatosis: Secondary | ICD-10-CM | POA: Diagnosis not present

## 2023-06-24 DIAGNOSIS — M25562 Pain in left knee: Secondary | ICD-10-CM | POA: Diagnosis not present

## 2023-06-24 DIAGNOSIS — G8929 Other chronic pain: Secondary | ICD-10-CM

## 2023-06-24 DIAGNOSIS — M1712 Unilateral primary osteoarthritis, left knee: Secondary | ICD-10-CM | POA: Diagnosis not present

## 2023-06-24 DIAGNOSIS — M25511 Pain in right shoulder: Secondary | ICD-10-CM | POA: Diagnosis not present

## 2023-06-24 MED ORDER — TRIAMCINOLONE ACETONIDE 32 MG IX SRER
32.0000 mg | Freq: Once | INTRA_ARTICULAR | Status: AC
  Administered 2023-06-24: 32 mg via INTRA_ARTICULAR

## 2023-06-24 NOTE — Patient Instructions (Addendum)
 Thank you for coming in today.   You received an injection today. Seek immediate medical attention if the joint becomes red, extremely painful, or is oozing fluid.   Gel heel cup  Please work on the home exercises the athletic trainer went over with you:  View at [email or web address] code [id number]

## 2023-06-24 NOTE — Progress Notes (Unsigned)
 Michele Muck, PhD, LAT, ATC acting as a scribe for Michele Juniper, MD.  Michele [name] is a 83 y.o. female who presents to [facility] at [facility] today for exacerbation of her L knee pain. Pt was last seen by Dr. Alease Hunter on 03/25/23 and was given a repeat L knee Zilretta  injection.  Today, pt reports L knee pain is just barely starting to returned. Pt c/o R heel pain x 2-wks. Pt locates pain to the plantar aspect of the heel, but will also hurt medially and laterally.   Aggravates: worse 1st thing in the morning Treatments: none  Pt also c/o R shoulder pain that's chronic in nature, worsening lately. Pain is disturbing her sleep at night when side-lying.  Dx imaging: 09/12/21 L knee XR   Pertinent review of systems: No fevers or chills  Relevant historical information: Breast cancer.     Exam:  BP 134/76   Pulse 71   Ht 5\' 4"  (1.626 m)   Wt 156 lb (70.8 kg)   SpO2 98%   BMI 26.78 kg/m  General: Well Developed, well nourished, and in no acute distress.   MSK: Right shoulder: Normal appearing.  Normal motion some pain with abduction. Intact strength. Mildly positive Hawkins and Neer's test. Negative Yergason's and speeds test.  Left knee minimal effusion normal motion with crepitation nontender to palpation.  Right calcaneus normal-appearing Tender palpation plantar calcaneus plantar fascia origin.  Normal foot and ankle motion. Intact strength.  Lab and Radiology Results   Zilretta  injection left knee Procedure: Real-time Ultrasound Guided Injection of left knee joint superior lateral patellar space Device: Philips Affiniti 50G Images permanently stored and available for review in PACS Verbal informed consent obtained.  Discussed risks and benefits of procedure. Warned about infection, hyperglycemia bleeding, damage to structures among others. Patient expresses understanding and agreement Time-out conducted.   Noted no overlying erythema,  induration, or other signs of local infection.   Skin prepped in a sterile fashion.   Local anesthesia: Topical Ethyl chloride.   With sterile technique and under real time ultrasound guidance: Zilretta  32 mg injected into knee joint. Fluid seen entering the joint capsule.   Completed without difficulty   Advised to call if fevers/chills, erythema, induration, drainage, or persistent bleeding.   Images permanently stored and available for review in the ultrasound unit.  Impression: Technically successful ultrasound guided injection.  Lot number: [id number]     Assessment and Plan: 83 y.o. female with chronic left knee pain due to DJD.  Plan for Zilretta  injection today.  She thinks that her foot is hurting because she has been limping around on her left knee which is a legitimate possibility.  Plan for Zilretta  today and potentially repeat it in 3 months or so.  Right plantar calcaneus pain plantar fasciitis.  Home exercise program reviewed.  Also recommend night splints and ice and good heel cushioning.  Right shoulder pain due to impingement.  Home exercise program reviewed.  Will refer to PT if not improving.  Patient will let me know.     PDMP not reviewed this encounter. Orders Placed This Encounter  Procedures   US  LIMITED JOINT SPACE STRUCTURES UP RIGHT(NO LINKED CHARGES)    Reason for Exam (SYMPTOM  OR DIAGNOSIS REQUIRED):   right shoulder pain    Preferred imaging location?:    [facility] ordered this encounter  Medications   Triamcinolone  Acetonide (ZILRETTA ) intra-articular injection 32 mg  Discussed warning signs or symptoms. Please see discharge instructions. Patient expresses understanding.   The above documentation has been reviewed and is accurate and complete Michele [name], M.D.

## 2023-06-25 DIAGNOSIS — M1712 Unilateral primary osteoarthritis, left knee: Secondary | ICD-10-CM | POA: Insufficient documentation

## 2023-06-25 DIAGNOSIS — M722 Plantar fascial fibromatosis: Secondary | ICD-10-CM | POA: Insufficient documentation

## 2023-06-25 DIAGNOSIS — G8929 Other chronic pain: Secondary | ICD-10-CM | POA: Insufficient documentation

## 2023-08-06 DIAGNOSIS — M722 Plantar fascial fibromatosis: Secondary | ICD-10-CM | POA: Diagnosis not present

## 2023-08-20 ENCOUNTER — Encounter: Payer: Self-pay | Admitting: Family Medicine

## 2023-08-20 ENCOUNTER — Ambulatory Visit: Admitting: Family Medicine

## 2023-08-20 VITALS — BP 112/74 | HR 83 | Ht 64.0 in | Wt 155.0 lb

## 2023-08-20 DIAGNOSIS — M722 Plantar fascial fibromatosis: Secondary | ICD-10-CM | POA: Diagnosis not present

## 2023-08-20 NOTE — Patient Instructions (Addendum)
 Thank you for coming in today.   Try [employer] Recovery Sandals With Comfortable Plantar Fasciitis Support,Ladies Orthotic Open Toe Sport Slides Thick Cushion Reduces Stress on Feet,Joints & Back Post-Exercise  [email or web address]://www.[email or web address]  If you think this helped a little ok to schedule 3 more typically weekly.   Its about $75 a session.

## 2023-08-20 NOTE — Progress Notes (Signed)
   I, Leotis Batter, CMA acting as a scribe for Artist Lloyd, MD.  Michele [name] is a 83 y.o. female who presents to [facility] at [facility] today for bilat heel pain. Pt was last seen by Dr. Lloyd on 06/24/23 and she was only having pain in her R heel.  Today, pt c/o bilat heel pain x 2+ months. Sx worse with standing, ambulating. Has tried Tylenol , ice bottle massage, and arch support. Arch Support causing sx exacerbation at times. LL Beans shoes with a soft lining have been the most comfortable. Tried to do water aerobics when able. Has been working on HEP previously provided.   Pt has also recently seen by [facility] for this issue.  She had a steroid injection in her right plantar fascia a few weeks ago which did not help.  Aggravates: WB Treatments tried: Tylenol , Arch Support  Dx imaging: 08/06/23 R calcaneous XR  Pertinent review of systems: No fevers or chills  Relevant historical information: History of breast cancer.   Exam:  BP 112/74   Pulse 83   Ht 5' 4 (1.626 m)   Wt 155 lb (70.3 kg)   SpO2 98%   BMI 26.61 kg/m  General: Well Developed, well nourished, and in no acute distress.   MSK: Right foot normal-appearing tender palpation medial plantar calcaneus.  Normal foot and ankle motion.               Extracorporeal Shockwave Therapy Note    Patient is being treated today with ECSWT. Informed consent was obtained and patient tolerated procedure well.   Therapy performed by Artist Lloyd  Condition treated: Right plantar fascia Treatment preset used: Plantar fasciitis Energy used: 120 mJ Frequency used: 15 Hz Number of pulses: 2000 Head Size: Medium Treatment #1 of #4   Assessment and Plan: 83 y.o. female with right plantar fasciitis.  Already doing a pretty good job with conservative management including home exercise heel cushion ice massage.  She was intolerant of night splints.  She is already had a steroid injection with podiatry  a few weeks ago which did not help.  Plan for shockwave today.  No charge for shockwave today.  Return in 1 week for planned shockwave 2/4.   PDMP not reviewed this encounter. No orders of the defined types were placed in this encounter.  No orders of the defined types were placed in this encounter.    Discussed warning signs or symptoms. Please see discharge instructions. Patient expresses understanding.   The above documentation has been reviewed and is accurate and complete Artist Lloyd, M.D.

## 2023-08-22 DIAGNOSIS — M810 Age-related osteoporosis without current pathological fracture: Secondary | ICD-10-CM | POA: Diagnosis not present

## 2023-08-22 DIAGNOSIS — R7309 Other abnormal glucose: Secondary | ICD-10-CM | POA: Diagnosis not present

## 2023-08-22 DIAGNOSIS — E785 Hyperlipidemia, unspecified: Secondary | ICD-10-CM | POA: Diagnosis not present

## 2023-08-26 DIAGNOSIS — E785 Hyperlipidemia, unspecified: Secondary | ICD-10-CM | POA: Diagnosis not present

## 2023-08-26 DIAGNOSIS — J309 Allergic rhinitis, unspecified: Secondary | ICD-10-CM | POA: Diagnosis not present

## 2023-08-26 DIAGNOSIS — K219 Gastro-esophageal reflux disease without esophagitis: Secondary | ICD-10-CM | POA: Diagnosis not present

## 2023-08-26 DIAGNOSIS — F339 Major depressive disorder, recurrent, unspecified: Secondary | ICD-10-CM | POA: Diagnosis not present

## 2023-08-26 DIAGNOSIS — Z0000 Encounter for general adult medical examination without abnormal findings: Secondary | ICD-10-CM | POA: Diagnosis not present

## 2023-08-26 DIAGNOSIS — K5909 Other constipation: Secondary | ICD-10-CM | POA: Diagnosis not present

## 2023-08-26 DIAGNOSIS — M810 Age-related osteoporosis without current pathological fracture: Secondary | ICD-10-CM | POA: Diagnosis not present

## 2023-08-26 DIAGNOSIS — I2510 Atherosclerotic heart disease of native coronary artery without angina pectoris: Secondary | ICD-10-CM | POA: Diagnosis not present

## 2023-08-27 ENCOUNTER — Ambulatory Visit (INDEPENDENT_AMBULATORY_CARE_PROVIDER_SITE_OTHER): Payer: Self-pay | Admitting: Family Medicine

## 2023-08-27 DIAGNOSIS — M722 Plantar fascial fibromatosis: Secondary | ICD-10-CM

## 2023-08-27 NOTE — Progress Notes (Unsigned)
                [facility] [address] [place] [address] Phone: [phone]   Extracorporeal Shockwave Therapy Note    Patient is being treated today with ECSWT. Informed consent was obtained and patient tolerated procedure well.   Therapy performed by Artist Joane  Condition treated: Right plantar fasciitis Treatment preset used: Plantar fasciitis Energy used: 120 mJ Frequency used: 15 Hz Number of pulses: 2000 Head Size: Medium Treatment #2 of #4  Electronically signed by:  [facility] 6:44 AM 08/28/23

## 2023-08-28 NOTE — Patient Instructions (Signed)
 Return in 1 week for shockwave #3

## 2023-09-02 ENCOUNTER — Other Ambulatory Visit: Payer: Self-pay | Admitting: Hematology and Oncology

## 2023-09-02 DIAGNOSIS — Z1231 Encounter for screening mammogram for malignant neoplasm of breast: Secondary | ICD-10-CM

## 2023-09-03 ENCOUNTER — Ambulatory Visit (INDEPENDENT_AMBULATORY_CARE_PROVIDER_SITE_OTHER): Payer: Self-pay | Admitting: Family Medicine

## 2023-09-03 ENCOUNTER — Ambulatory Visit: Admitting: Sports Medicine

## 2023-09-03 DIAGNOSIS — M722 Plantar fascial fibromatosis: Secondary | ICD-10-CM

## 2023-09-03 NOTE — Patient Instructions (Signed)
 Schedule next week for fourth plantar fascia shockwave

## 2023-09-03 NOTE — Progress Notes (Signed)
   [facility] [address] [place] [address] Phone: [phone]   Extracorporeal Shockwave Therapy Note    Patient is being treated today with ECSWT. Informed consent was obtained and patient tolerated procedure well.   Therapy performed by Artist Joane  Condition treated: Right plantar fasciitis Treatment preset used: Plantar fasciitis Energy used: 120 mJ Frequency used: 15 Hz Number of pulses: 2500 Head Size: Medium Treatment #3 of #4  Electronically signed by:  [facility] 2:25 PM 09/03/23

## 2023-09-10 ENCOUNTER — Ambulatory Visit (INDEPENDENT_AMBULATORY_CARE_PROVIDER_SITE_OTHER): Payer: Self-pay | Admitting: Family Medicine

## 2023-09-10 DIAGNOSIS — M722 Plantar fascial fibromatosis: Secondary | ICD-10-CM

## 2023-09-11 NOTE — Patient Instructions (Signed)
Thank you for coming in today.   Return as needed.    

## 2023-09-11 NOTE — Progress Notes (Signed)
   [facility] [address] [place] [address] Phone: [phone]   Extracorporeal Shockwave Therapy Note    Patient is being treated today with ECSWT. Informed consent was obtained and patient tolerated procedure well.   Therapy performed by Artist Joane  Condition treated: Right plantar fasciitis Treatment preset used: Plantar fasciitis Energy used: 120 mJ Frequency used: 15 Hz Number of pulses: 2500 Head Size: Medium Treatment #4 of #4  Electronically signed by:  [facility] 5:37 AM 09/11/23

## 2023-09-17 ENCOUNTER — Ambulatory Visit: Admitting: Family Medicine

## 2023-10-15 NOTE — Progress Notes (Unsigned)
    [name] [address] [place] [address] Phone: [phone]   Assessment and Plan:     There are no diagnoses linked to this encounter.  ***   Pertinent previous records reviewed include ***    Follow Up: ***     Subjective:    Chief Complaint: ***  HPI:   10/16/23 Patient states  Relevant Historical Information: ***  Additional pertinent review of systems negative.   Current Outpatient Medications:    Cholecalciferol (VITAMIN D3) 1000 units CAPS, Take by mouth daily., Disp: , Rfl:    citalopram (CELEXA) 10 MG tablet, SMARTSIG:1 Tablet(s) By Mouth, Disp: , Rfl:    EPINEPHrine  (EPIPEN  2-PAK) 0.3 mg/0.3 mL IJ SOAJ injection, Inject 0.3 mg into the muscle once as needed (for severe allergic reaction). CAll 911 immediately if you have to use this medicine, Disp: 2 each, Rfl: 0   fluticasone (VERAMYST) 27.5 MCG/SPRAY nasal spray, Place 2 sprays into the nose as needed., Disp: , Rfl:    REPATHA  SURECLICK 140 MG/ML SOAJ, Inject 140 mg into the skin every 14 (fourteen) days., Disp: 6 mL, Rfl: 3   Triamcinolone  Acetonide (ZILRETTA ) 32 MG SRER intra-articular injection, Inject 5 mL, intra-articular, left knee, Disp: 1 each, Rfl: 0   Objective:     There were no vitals filed for this visit.    There is no height or weight on file to calculate BMI.    Physical Exam:    ***   Electronically signed by:  Odis Mace [name] 3:33 PM 10/15/23

## 2023-10-16 ENCOUNTER — Ambulatory Visit (INDEPENDENT_AMBULATORY_CARE_PROVIDER_SITE_OTHER): Payer: Self-pay | Admitting: Sports Medicine

## 2023-10-16 VITALS — BP 132/60 | HR 88 | Ht 64.0 in

## 2023-10-16 DIAGNOSIS — M1712 Unilateral primary osteoarthritis, left knee: Secondary | ICD-10-CM | POA: Diagnosis not present

## 2023-10-16 DIAGNOSIS — G8929 Other chronic pain: Secondary | ICD-10-CM | POA: Diagnosis not present

## 2023-10-16 DIAGNOSIS — M25562 Pain in left knee: Secondary | ICD-10-CM

## 2023-10-16 MED ORDER — TRIAMCINOLONE ACETONIDE 32 MG IX SRER
32.0000 mg | Freq: Once | INTRA_ARTICULAR | Status: AC
  Administered 2023-10-16: 32 mg via INTRA_ARTICULAR

## 2023-10-16 NOTE — Patient Instructions (Signed)
 Good to see you  Zilretta  injection given in left knee today  Follow up as needed for further Zilretta  injections

## 2023-10-16 NOTE — Addendum Note (Signed)
 Addended by: TONNIE SHU D on: 10/16/2023 01:51 PM   Modules accepted: Orders

## 2023-10-24 NOTE — Progress Notes (Unsigned)
   [name], PhD, LAT, ATC acting as a scribe for Artist Lloyd, MD.  Michele [name] is a 83 y.o. female who presents to [facility] at [facility] today for f/u L knee pain. Pt was last seen by Dr. Leonce on 10/16/23 and her L knee was injected w/ Zilretta .  Today, pt reports she prefers Dr. Virgilio injection technique better. She notes increased pain in the morning. She is wondering about taking a slow release aspirin, as she tried one of her husband's and found it effective. She has been working on [employer] for her R shoulder do to developing R-sided neck and trapz pain.   Dx imaging: 09/12/21 L knee XR   Pertinent review of systems: No fevers or chills  Relevant historical information: History of breast cancer   Exam:  BP 112/74   Pulse 73   Ht 5' 4 (1.626 m)   Wt 155 lb (70.3 kg)   SpO2 99%   BMI 26.61 kg/m  General: Well Developed, well nourished, and in no acute distress.   MSK: C-spine normal appearing Nontender palpation midline.  Tender palpation right cervical paraspinal musculature. Decree cervical motion. I personally strength is intact.  Left knee normal-appearing normal motion.    Assessment and Plan: 83 y.o. female with neck pain right sided due to muscle spasm and dysfunction.  Plan for heating pad TENS unit home exercise program.  Consider formal physical therapy.  Tizanidine  prescribed.  Left knee pain.  Patient had a Zilretta  injection about a week ago.  It is a little early to tell how well this is gena go.  Watchful waiting for now.   PDMP not reviewed this encounter. No orders of the defined types were placed in this encounter.  Meds ordered this encounter  Medications   tiZANidine  (ZANAFLEX ) 2 MG tablet    Sig: Take 1-2 tablets (2-4 mg total) by mouth every 8 (eight) hours as needed.    Dispense:  60 tablet    Refill:  1     Discussed warning signs or symptoms. Please see discharge instructions. Patient expresses  understanding.   The above documentation has been reviewed and is accurate and complete Artist Lloyd, M.D.

## 2023-10-25 ENCOUNTER — Ambulatory Visit: Payer: Self-pay | Admitting: Family Medicine

## 2023-10-25 ENCOUNTER — Telehealth: Payer: Self-pay | Admitting: Family Medicine

## 2023-10-25 ENCOUNTER — Encounter: Payer: Self-pay | Admitting: Family Medicine

## 2023-10-25 VITALS — BP 112/74 | HR 73 | Ht 64.0 in | Wt 155.0 lb

## 2023-10-25 DIAGNOSIS — G8929 Other chronic pain: Secondary | ICD-10-CM

## 2023-10-25 DIAGNOSIS — M25562 Pain in left knee: Secondary | ICD-10-CM

## 2023-10-25 DIAGNOSIS — M542 Cervicalgia: Secondary | ICD-10-CM

## 2023-10-25 MED ORDER — TIZANIDINE HCL 2 MG PO TABS: 2.0000 mg | ORAL_TABLET | Freq: Three times a day (TID) | ORAL | 1 refills | Status: AC | PRN

## 2023-10-25 NOTE — Telephone Encounter (Signed)
 Pt called, stated Dr. Joane wanted to know the name of the rx her husband had that she had taken for her knee.  GNP Aspirin 325 mg  Unsure if you wanted pt to use this med.

## 2023-10-25 NOTE — Patient Instructions (Addendum)
 Thank you for coming in today.   Heating pad,  Tizidine as needed.   Xray as needed.   PT if needed.   Let me know what medicine Lemond was taking

## 2023-10-29 ENCOUNTER — Ambulatory Visit
Admission: RE | Admit: 2023-10-29 | Discharge: 2023-10-29 | Disposition: A | Discharge status: Home / Self Care | Source: Ambulatory Visit | Attending: Hematology and Oncology | Admitting: Hematology and Oncology

## 2023-10-29 DIAGNOSIS — Z1231 Encounter for screening mammogram for malignant neoplasm of breast: Secondary | ICD-10-CM | POA: Diagnosis not present

## 2023-10-29 DIAGNOSIS — J3081 Allergic rhinitis due to animal (cat) (dog) hair and dander: Secondary | ICD-10-CM | POA: Diagnosis not present

## 2023-10-29 DIAGNOSIS — J3089 Other allergic rhinitis: Secondary | ICD-10-CM | POA: Diagnosis not present

## 2023-10-29 DIAGNOSIS — Z91018 Allergy to other foods: Secondary | ICD-10-CM | POA: Diagnosis not present

## 2023-10-29 NOTE — Telephone Encounter (Signed)
 Forwarding to Dr. Denyse Amass.

## 2023-10-30 DIAGNOSIS — Z91018 Allergy to other foods: Secondary | ICD-10-CM | POA: Diagnosis not present

## 2023-10-30 DIAGNOSIS — H40013 Open angle with borderline findings, low risk, bilateral: Secondary | ICD-10-CM | POA: Diagnosis not present

## 2023-10-30 NOTE — Telephone Encounter (Signed)
 Well that is just over-the-counter aspirin.  I think it is perfectly fine to use and is available over-the-counter.  GNP Aspirin is a brand of aspirin manufactured for [employer] As a generic product, its active ingredient is acetylsalicylic acid, the same as other aspirin products, such as Bayer Aspirin. It is available in various forms and dosages.

## 2023-12-12 DIAGNOSIS — Z85828 Personal history of other malignant neoplasm of skin: Secondary | ICD-10-CM | POA: Diagnosis not present

## 2023-12-12 DIAGNOSIS — L578 Other skin changes due to chronic exposure to nonionizing radiation: Secondary | ICD-10-CM | POA: Diagnosis not present

## 2023-12-12 DIAGNOSIS — L821 Other seborrheic keratosis: Secondary | ICD-10-CM | POA: Diagnosis not present

## 2024-01-03 DIAGNOSIS — T148XXA Other injury of unspecified body region, initial encounter: Secondary | ICD-10-CM | POA: Diagnosis not present

## 2024-01-03 DIAGNOSIS — H6121 Impacted cerumen, right ear: Secondary | ICD-10-CM | POA: Diagnosis not present

## 2024-01-15 DIAGNOSIS — Z23 Encounter for immunization: Secondary | ICD-10-CM | POA: Diagnosis not present

## 2024-01-15 DIAGNOSIS — R0789 Other chest pain: Secondary | ICD-10-CM | POA: Diagnosis not present

## 2024-01-15 DIAGNOSIS — L509 Urticaria, unspecified: Secondary | ICD-10-CM | POA: Diagnosis not present

## 2024-01-15 DIAGNOSIS — Z91018 Allergy to other foods: Secondary | ICD-10-CM | POA: Diagnosis not present

## 2024-01-20 ENCOUNTER — Telehealth: Payer: Self-pay | Admitting: Family Medicine

## 2024-01-20 NOTE — Telephone Encounter (Signed)
 Noted.  Please advise on auth.

## 2024-01-20 NOTE — Telephone Encounter (Signed)
 Pt has scheduled L knee Zilretta  for 02/03/2024, last done 10/16/2023.  I believe another shara is needed, last expired 12/23/2023.

## 2024-01-21 NOTE — Telephone Encounter (Signed)
 Ran benefits for left knee zilretta  Rjdz#[id number]

## 2024-01-27 NOTE — Telephone Encounter (Signed)
 Had to pre cert for Zilretta , has been started through [facility]: [id number]

## 2024-01-29 ENCOUNTER — Telehealth: Payer: Self-pay | Admitting: *Deleted

## 2024-01-29 NOTE — Telephone Encounter (Signed)
 Per [facility] provider, pt needing to be seen by surgery for breast concerns.  RN sent inbasket to Dr. Cornette and his nurse to schedule f/u.  Pt notified and verbalized understanding.

## 2024-01-29 NOTE — Telephone Encounter (Signed)
 Patient called to report concern for swollen area under left breast on left side near rib cage.  Reports that has been present for 1 month.  Initially it radiated pain throughout her chest wall but now is very sore and the area is swollen in a enlongated pattern.  Denies any falls or injury to that area.    States she spoke with PCP about it and they didn't investigate or give any concern about her findings.  Recent Mammo 10/29/2023 however does not capture area that she is stating is swollen.    Wanted Dr Gara opinion on how to proceed in finding out what is going on.  Routed to Dr Odean to please advise

## 2024-01-31 NOTE — Telephone Encounter (Signed)
 Patient scheduled 02/03/24  Zilretta  authorized for Left knee PRE CERT REQUIRED Copay $40 Deductible does not apply OOP MAX $4000 has met $437.94 Once the OOP has been met coverage goes to 100% Authorization number [id number] 01/27/24-02/25/25

## 2024-01-31 NOTE — Telephone Encounter (Signed)
 noted

## 2024-02-03 ENCOUNTER — Other Ambulatory Visit: Payer: Self-pay

## 2024-02-03 ENCOUNTER — Ambulatory Visit: Admitting: Family Medicine

## 2024-02-03 VITALS — BP 112/74 | HR 73 | Ht 64.0 in | Wt 155.0 lb

## 2024-02-03 DIAGNOSIS — G8929 Other chronic pain: Secondary | ICD-10-CM

## 2024-02-03 DIAGNOSIS — M1712 Unilateral primary osteoarthritis, left knee: Secondary | ICD-10-CM

## 2024-02-03 MED ORDER — TRIAMCINOLONE ACETONIDE 32 MG IX SRER
32.0000 mg | Freq: Once | INTRA_ARTICULAR | Status: AC
  Administered 2024-02-03: 32 mg via INTRA_ARTICULAR

## 2024-02-03 NOTE — Progress Notes (Signed)
   [name], PhD, LAT, ATC acting as a scribe for Michele Lloyd, MD.  Michele [name] is a 83 y.o. female who presents to [facility] at [facility] today for exacerbation of her L knee pain. Pt was last seen by Dr. Leonce on 10/16/23 and her L knee was injected w/ Zilretta .  Today, pt reports L knee pain is just starting to return. She note her prior injection using the anterior approach was very painful. Pain and stiffness in the mornings  Dx imaging: 09/12/21 L knee XR   Pertinent review of systems: No fevers or chills  Relevant historical information: History of breast cancer.   Exam:  BP 112/74   Pulse 73   Ht 5' 4 (1.626 m)   Wt 155 lb (70.3 kg)   SpO2 99%   BMI 26.61 kg/m  General: Well Developed, well nourished, and in no acute distress.   MSK: Left knee mild effusion normal-appearing otherwise normal motion.    Lab and Radiology Results   Zilretta  injection left knee Procedure: Real-time Ultrasound Guided Injection of left knee joint superior lateral patellar space Device: Philips Affiniti 50G Images permanently stored and available for review in PACS Verbal informed consent obtained.  Discussed risks and benefits of procedure. Warned about infection, hyperglycemia bleeding, damage to structures among others. Patient expresses understanding and agreement Time-out conducted.   Noted no overlying erythema, induration, or other signs of local infection.   Skin prepped in a sterile fashion.   Local anesthesia: Topical Ethyl chloride.   With sterile technique and under real time ultrasound guidance: Zilretta  32 mg injected into knee joint. Fluid seen entering the joint capsule.   Completed without difficulty   Advised to call if fevers/chills, erythema, induration, drainage, or persistent bleeding.   Images permanently stored and available for review in the ultrasound unit.  Impression: Technically successful ultrasound guided injection.  Lot  number: [id number]      Assessment and Plan: 83 y.o. female with left knee pain due to exacerbation of DJD.  Plan for Zilretta  injection today.  Check back as needed.   PDMP not reviewed this encounter. Orders Placed This Encounter  Procedures   US  LIMITED JOINT SPACE STRUCTURES LOW LEFT(NO LINKED CHARGES)    Reason for Exam (SYMPTOM  OR DIAGNOSIS REQUIRED):   left knee pain    Preferred imaging location?:   [facility] ordered this encounter  Medications   Triamcinolone  Acetonide (ZILRETTA ) intra-articular injection 32 mg     Discussed warning signs or symptoms. Please see discharge instructions. Patient expresses understanding.   The above documentation has been reviewed and is accurate and complete Michele [name], M.D.

## 2024-02-03 NOTE — Patient Instructions (Signed)
 Thank you for coming in today.   You received an injection today. Seek immediate medical attention if the joint becomes red, extremely painful, or is oozing fluid.

## 2024-02-11 ENCOUNTER — Other Ambulatory Visit: Payer: Self-pay | Admitting: Surgery

## 2024-02-11 DIAGNOSIS — R222 Localized swelling, mass and lump, trunk: Secondary | ICD-10-CM

## 2024-02-17 ENCOUNTER — Other Ambulatory Visit

## 2024-02-17 DIAGNOSIS — R222 Localized swelling, mass and lump, trunk: Secondary | ICD-10-CM

## 2024-02-17 MED ORDER — IOPAMIDOL (ISOVUE-300) INJECTION 61%
100.0000 mL | Freq: Once | INTRAVENOUS | Status: AC | PRN
  Administered 2024-02-17: 100 mL via INTRAVENOUS

## 2024-02-22 ENCOUNTER — Ambulatory Visit: Payer: Self-pay | Admitting: Surgery

## 2024-02-28 ENCOUNTER — Other Ambulatory Visit: Payer: Self-pay | Admitting: Surgery

## 2024-02-28 ENCOUNTER — Encounter (HOSPITAL_COMMUNITY): Payer: Self-pay | Admitting: Surgery

## 2024-02-28 DIAGNOSIS — R9389 Abnormal findings on diagnostic imaging of other specified body structures: Secondary | ICD-10-CM

## 2024-02-28 DIAGNOSIS — N6320 Unspecified lump in the left breast, unspecified quadrant: Secondary | ICD-10-CM

## 2024-03-02 ENCOUNTER — Other Ambulatory Visit

## 2024-03-02 ENCOUNTER — Encounter

## 2024-03-02 DIAGNOSIS — N6320 Unspecified lump in the left breast, unspecified quadrant: Secondary | ICD-10-CM

## 2024-03-02 DIAGNOSIS — R9389 Abnormal findings on diagnostic imaging of other specified body structures: Secondary | ICD-10-CM

## 2024-03-02 HISTORY — PX: BREAST BIOPSY: SHX20

## 2024-03-04 LAB — SURGICAL PATHOLOGY

## 2024-03-09 ENCOUNTER — Telehealth: Payer: Self-pay | Admitting: Hematology and Oncology

## 2024-03-09 NOTE — Telephone Encounter (Signed)
Left vm for pt to call back and schedule f/u appt

## 2024-03-19 ENCOUNTER — Telehealth: Payer: Self-pay

## 2024-03-19 NOTE — Telephone Encounter (Signed)
 Pt's appt r/s for 03/24/24 at 1130. She is agreeable.

## 2024-03-20 ENCOUNTER — Inpatient Hospital Stay: Admitting: Hematology and Oncology

## 2024-03-20 ENCOUNTER — Inpatient Hospital Stay

## 2024-03-20 ENCOUNTER — Other Ambulatory Visit: Payer: Self-pay | Admitting: *Deleted

## 2024-03-20 DIAGNOSIS — C50411 Malignant neoplasm of upper-outer quadrant of right female breast: Secondary | ICD-10-CM

## 2024-03-23 ENCOUNTER — Inpatient Hospital Stay: Admitting: Hematology and Oncology

## 2024-03-23 NOTE — Progress Notes (Signed)
 Attempted to contact pt regarding her call with the after hours service. Left pt a message to call office in the am after 10 if she needs anything.

## 2024-03-24 ENCOUNTER — Inpatient Hospital Stay: Admitting: Hematology and Oncology

## 2024-03-24 ENCOUNTER — Inpatient Hospital Stay: Attending: Internal Medicine

## 2024-03-24 DIAGNOSIS — C801 Malignant (primary) neoplasm, unspecified: Secondary | ICD-10-CM | POA: Insufficient documentation

## 2024-03-24 NOTE — Assessment & Plan Note (Signed)
 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 Adjuvant antiestrogen therapy with letrozole  2.5 mg daily x5 years started November 2022 discontinued January 2023 (for severe joint stiffness and achiness)  Alpha gal syndrome: Patient has unique sensitivities to drugs which will need to be kept in mind before prescribing anything.

## 2024-03-24 NOTE — Assessment & Plan Note (Deleted)
 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

## 2024-03-24 NOTE — Assessment & Plan Note (Addendum)
 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  I sent a message to pathology to further understand her pathology picture. These neoplasms can range from benign or low risk conditions like PASH/benign phyllodes tumor intermediate risk conditions like for largest tumor 2 more malignant processes like metaplastic breast cancer spindle cell type or malignant phyllodes tumor.  Treatment plan: Surgical resection Adjuvant radiation  No role of systemic treatments with chemotherapy or antiestrogens

## 2024-03-29 ENCOUNTER — Encounter: Payer: Self-pay | Admitting: *Deleted

## 2024-03-29 NOTE — Progress Notes (Signed)
 Patient cancelled her appt with Dr. Gudena last week likely due to weather conditions. Will follow up with patient for rescheduling.

## 2024-03-30 ENCOUNTER — Encounter: Payer: Self-pay | Admitting: Cardiovascular Disease

## 2024-03-30 ENCOUNTER — Ambulatory Visit: Admitting: Cardiovascular Disease

## 2024-03-30 VITALS — BP 104/50 | HR 88 | Ht 64.0 in | Wt 155.0 lb

## 2024-03-30 DIAGNOSIS — R931 Abnormal findings on diagnostic imaging of heart and coronary circulation: Secondary | ICD-10-CM

## 2024-03-30 DIAGNOSIS — E782 Mixed hyperlipidemia: Secondary | ICD-10-CM | POA: Diagnosis not present

## 2024-03-30 NOTE — Patient Instructions (Signed)

## 2024-03-31 ENCOUNTER — Ambulatory Visit: Admitting: Thoracic Surgery (Cardiothoracic Vascular Surgery)

## 2024-03-31 ENCOUNTER — Encounter: Payer: Self-pay | Admitting: Thoracic Surgery (Cardiothoracic Vascular Surgery)

## 2024-03-31 VITALS — BP 157/82 | HR 96 | Resp 20 | Ht 64.0 in | Wt 156.1 lb

## 2024-03-31 DIAGNOSIS — R222 Localized swelling, mass and lump, trunk: Secondary | ICD-10-CM | POA: Diagnosis not present

## 2024-03-31 NOTE — Progress Notes (Incomplete)
 Location of Breast Cancer: Left breast/chest wall  Histology per Pathology Report: ***  Receptor Status: ER(***), PR (***), Her2-neu (***), Ki-(***)  Did patient present with symptoms (if so, please note symptoms) or was this found on screening mammography?:  Patient presented with a palpable lump  Past/Anticipated interventions by surgeon, if any:   BREAST BIOPSY Left 03/02/2024     US  LT BREAST BX W LOC DEV 1ST LESION IMG BX SPEC US  GUIDE 03/02/2024 [facility] MAMMOGRAPHY    Past/Anticipated interventions by medical oncology, if any: Chemotherapy ***  Lymphedema issues, if any:  {:18581} {t:21944}   Pain issues, if any:  {:18581} {PAIN DESCRIPTION:21022940}  Skin issues if any:   SAFETY ISSUES: Prior radiation? yes Pacemaker/ICD? {:18581} Possible current pregnancy? no Is the patient on methotrexate? {:18581}  Current Complaints / other details:  ***    Dyke JULIANNA Frost, LPN 09/01/7971,5:90 PM

## 2024-04-01 ENCOUNTER — Telehealth: Payer: Self-pay | Admitting: Radiation Oncology

## 2024-04-01 ENCOUNTER — Telehealth: Payer: Self-pay | Admitting: Cardiovascular Disease

## 2024-04-01 ENCOUNTER — Other Ambulatory Visit: Payer: Self-pay | Admitting: *Deleted

## 2024-04-01 ENCOUNTER — Telehealth: Payer: Self-pay | Admitting: *Deleted

## 2024-04-01 ENCOUNTER — Encounter: Payer: Self-pay | Admitting: *Deleted

## 2024-04-01 DIAGNOSIS — R222 Localized swelling, mass and lump, trunk: Secondary | ICD-10-CM

## 2024-04-01 NOTE — Progress Notes (Signed)
 Surgical Instructions   Your procedure is scheduled on Monday April 06, 2024. Report to [facility] Main Entrance A at 8:30 A.M., then check in with the Admitting office. Any questions or running late day of surgery: call [phone]  Questions prior to your surgery date: call [phone], Monday-Friday, 8am-4pm. If you experience any cold or flu symptoms such as cough, fever, chills, shortness of breath, etc. between now and your scheduled surgery, please notify us  at the above number.     Remember:  Do not eat or drink after midnight the night before your surgery  Take these medicines the morning of surgery with A SIP OF WATER  traMADol  (ULTRAM )   May take these medicines IF NEEDED: fluticasone (VERAMYST)   CONTINUE TO TAKE YOUR ASPIRIN THROUGH THE DAY BEFORE SURGERY. DO NOT TAKE IT THE MORNING OF SURGERY.   One week prior to surgery, STOP taking any Aleve, Naproxen, Ibuprofen, Motrin, Advil, Goody's, BC's, all herbal medications, fish oil, and non-prescription vitamins.                     Do NOT Smoke (Tobacco/Vaping) for 24 hours prior to your procedure.  If you use a CPAP at night, you may bring your mask/headgear for your overnight stay.   You will be asked to remove any contacts, glasses, piercing's, hearing aid's, dentures/partials prior to surgery. Please bring cases for these items if needed.    Your surgeon will determine if you are to be admitted or discharged the same day.  Patients discharged the day of surgery will not be allowed to drive home, and someone needs to stay with them for 24 hours.  SURGICAL WAITING ROOM VISITATION Patients may have no more than 2 support people in the waiting area - these visitors may rotate.   Pre-op nurse will coordinate an appropriate time for 2 ADULT support persons, who may not rotate, to accompany patient in pre-op.  Children under the age of 29 must have an adult with them who is not the patient and must remain in the main  waiting area with an adult.  If the patient needs to stay at the hospital during part of their recovery, the visitor guidelines for inpatient rooms apply.  Please refer to the [facility] website for the visitor guidelines for any additional information.   If you received a COVID test during your pre-op visit  it is requested that you wear a mask when out in public, stay away from anyone that may not be feeling well and notify your surgeon if you develop symptoms. If you have been in contact with anyone that has tested positive in the last 10 days please notify you surgeon.      Pre-operative CHG Bathing Instructions   You can play a key role in reducing the risk of infection after surgery. Your skin needs to be as free of germs as possible. You can reduce the number of germs on your skin by washing with CHG (chlorhexidine  gluconate) soap before surgery. CHG is an antiseptic soap that kills germs and continues to kill germs even after washing.   DO NOT use if you have an allergy to chlorhexidine /CHG or antibacterial soaps. If your skin becomes reddened or irritated, stop using the CHG and notify one of our RNs at [phone].              TAKE A SHOWER THE NIGHT BEFORE SURGERY   Please keep in mind the following:  DO NOT  shave, including legs and underarms, 48 hours prior to surgery.   Place clean sheets on your bed the night before surgery Use a clean washcloth (not used since being washed) for shower. DO NOT sleep with pet's night before surgery.  CHG Shower Instructions:  Wash your face and private area with normal soap. If you choose to wash your hair, wash first with your normal shampoo.  After you use shampoo/soap, rinse your hair and body thoroughly to remove shampoo/soap residue.  Turn the water OFF and apply half the bottle of CHG soap to a CLEAN washcloth.  Apply CHG soap ONLY FROM YOUR NECK DOWN TO YOUR TOES (washing for 3-5 minutes)  DO NOT use CHG soap on face, private  areas, open wounds, or sores.  Pay special attention to the area where your surgery is being performed.  If you are having back surgery, having someone wash your back for you may be helpful. Wait 2 minutes after CHG soap is applied, then you may rinse off the CHG soap.  Pat dry with a clean towel  Put on clean pajamas    Additional instructions for the day of surgery: If you choose, you may shower the morning of surgery with an antibacterial soap.  DO NOT APPLY any lotions, deodorants or perfumes.   Do not wear jewelry or makeup Do not wear nail polish, gel polish, artificial nails, or any other type of covering on natural nails (fingers and toes) Do not bring valuables to the hospital. [facility] is not responsible for valuables/personal belongings. Put on clean/comfortable clothes.  Please brush your teeth.  Ask your nurse before applying any prescription medications to the skin.

## 2024-04-01 NOTE — Telephone Encounter (Signed)
 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 Pt wants to know if she needs to have a clearance sent by Dr to stop aspirin.

## 2024-04-01 NOTE — Telephone Encounter (Signed)
 Spoke with pt. Pt was letting us  know office is going to request EKG/clearance for procedure.

## 2024-04-01 NOTE — Telephone Encounter (Signed)
 Received VM from pt requesting if MD appt is needed in March.  Per MD he would still like to f/u with pt.  RN attempt x1 to return call.  No answer, LVM regarding the need to keep appt.

## 2024-04-01 NOTE — Telephone Encounter (Signed)
 Pt called to cancel RECON appt at this time. Pt has chosen surgery and does not want this appt with RadOnc. Pt was advised referral will be closed but if she changes her mind to let Dr. Kerrin know so a new referral can be sent. She verbalized understanding.

## 2024-04-02 ENCOUNTER — Other Ambulatory Visit: Payer: Self-pay

## 2024-04-02 ENCOUNTER — Inpatient Hospital Stay (HOSPITAL_COMMUNITY)
Admission: RE | Admit: 2024-04-02 | Discharge: 2024-04-02 | Disposition: A | Source: Ambulatory Visit | Attending: Thoracic Surgery (Cardiothoracic Vascular Surgery)

## 2024-04-02 ENCOUNTER — Ambulatory Visit (HOSPITAL_COMMUNITY)
Admission: RE | Admit: 2024-04-02 | Discharge: 2024-04-02 | Disposition: A | Source: Ambulatory Visit | Attending: Thoracic Surgery (Cardiothoracic Vascular Surgery) | Admitting: Thoracic Surgery (Cardiothoracic Vascular Surgery)

## 2024-04-02 ENCOUNTER — Ambulatory Visit

## 2024-04-02 ENCOUNTER — Ambulatory Visit: Admitting: Radiation Oncology

## 2024-04-02 ENCOUNTER — Encounter (HOSPITAL_COMMUNITY): Payer: Self-pay

## 2024-04-02 VITALS — BP 133/79 | HR 85 | Temp 98.4°F | Resp 17 | Ht 64.0 in | Wt 152.8 lb

## 2024-04-02 DIAGNOSIS — R222 Localized swelling, mass and lump, trunk: Secondary | ICD-10-CM

## 2024-04-02 DIAGNOSIS — Z01818 Encounter for other preprocedural examination: Secondary | ICD-10-CM

## 2024-04-02 LAB — PROTIME-INR
INR: 1 (ref 0.8–1.2)
Prothrombin Time: 13.6 s (ref 11.4–15.2)

## 2024-04-02 LAB — URINALYSIS, ROUTINE W REFLEX MICROSCOPIC
Bilirubin Urine: NEGATIVE
Glucose, UA: NEGATIVE mg/dL
Hgb urine dipstick: NEGATIVE
Ketones, ur: NEGATIVE mg/dL
Nitrite: NEGATIVE
Protein, ur: NEGATIVE mg/dL
Specific Gravity, Urine: 1.014 (ref 1.005–1.030)
pH: 5 (ref 5.0–8.0)

## 2024-04-02 LAB — COMPREHENSIVE METABOLIC PANEL WITH GFR
ALT: 10 U/L (ref 0–44)
AST: 21 U/L (ref 15–41)
Albumin: 4.1 g/dL (ref 3.5–5.0)
Alkaline Phosphatase: 66 U/L (ref 38–126)
Anion gap: 9 (ref 5–15)
BUN: 12 mg/dL (ref 8–23)
CO2: 31 mmol/L (ref 22–32)
Calcium: 9.6 mg/dL (ref 8.9–10.3)
Chloride: 99 mmol/L (ref 98–111)
Creatinine, Ser: 0.66 mg/dL (ref 0.44–1.00)
GFR, Estimated: >60 mL/min
Glucose, Bld: 100 mg/dL — ABNORMAL HIGH (ref 70–99)
Potassium: 4.5 mmol/L (ref 3.5–5.1)
Sodium: 139 mmol/L (ref 135–145)
Total Bilirubin: 0.6 mg/dL (ref 0.0–1.2)
Total Protein: 7.3 g/dL (ref 6.5–8.1)

## 2024-04-02 LAB — TYPE AND SCREEN
ABO/RH(D): O POS
Antibody Screen: NEGATIVE

## 2024-04-02 LAB — CBC
HCT: 43 % (ref 36.0–46.0)
Hemoglobin: 14 g/dL (ref 12.0–15.0)
MCH: 31.5 pg (ref 26.0–34.0)
MCHC: 32.6 g/dL (ref 30.0–36.0)
MCV: 96.8 fL (ref 80.0–100.0)
Platelets: 292 10*3/uL (ref 150–400)
RBC: 4.44 MIL/uL (ref 3.87–5.11)
RDW: 13.2 % (ref 11.5–15.5)
WBC: 6.2 10*3/uL (ref 4.0–10.5)
nRBC: 0 % (ref 0.0–0.2)

## 2024-04-02 LAB — APTT: aPTT: 37 s — ABNORMAL HIGH (ref 24–36)

## 2024-04-02 LAB — SURGICAL PCR SCREEN
MRSA, PCR: NEGATIVE
Staphylococcus aureus: NEGATIVE

## 2024-04-02 MED ORDER — SULFAMETHOXAZOLE-TRIMETHOPRIM 800-160 MG PO TABS: 1.0000 | ORAL_TABLET | Freq: Two times a day (BID) | ORAL | 0 refills | Status: AC

## 2024-04-02 NOTE — Progress Notes (Signed)
 PCP - Clarice Nottingham, MD Cardiologist - Court Carrier, MD  PPM/ICD - denies Device Orders - n/a Rep Notified - n/a  Chest x-ray - 04/02/2024 EKG - 02/02/20256 Stress Test - 10/15/2019 ECHO - denies Cardiac Cath - denies  Sleep Study - denies CPAP - n/a  Fasting Blood Sugar - no DM Checks Blood Sugar _____ times a day  Last dose of GLP1 agonist-  n/a GLP1 instructions: n/a  Blood Thinner Instructions: n/a Aspirin Instructions: hold on DOS  ERAS Protcol -no PRE-SURGERY Ensure or G2- n/a  COVID TEST- n/a   Anesthesia review: yes, hx of CAD  Patient denies shortness of breath, fever, cough and chest pain at PAT appointment   All instructions explained to the patient, with a verbal understanding of the material. Patient agrees to go over the instructions while at home for a better understanding. Patient also instructed to self quarantine after being tested for COVID-19. The opportunity to ask questions was provided.

## 2024-04-02 NOTE — Progress Notes (Signed)
-  Called patient after pre-operative testing showed leukocytes in her urine.   -Discussed if she had symptoms at this time, which she does not.  Denies frequency, pain with urination, urine discoloration, and odor -She reports that her urine is usually negative at her physicals.  -Prescribed Bactrim  800 mg/160mg  BID for 3 days at this time  -All questions answered

## 2024-04-02 NOTE — Progress Notes (Signed)
 U/A shows small amount of leukocytes and rare bacteria. Bernardino Sprang, RN is notified.

## 2024-04-03 NOTE — Progress Notes (Signed)
 Anesthesia Chart Review:  Case: [id number] Date/Time: 04/06/24 1021   Procedures:      EXCISION, MASS, CHEST WALL (Left) - Left chest wall mass resection with portions of the 5th, 6th, and 7th ribs.  Would require prosthetic reconstruction.     RECONSTRUCTION, MAJOR, CHEST WALL (Left)   Anesthesia type: General   Diagnosis: Chest wall mass [R22.2]   Pre-op diagnosis: LEFT CHEST WALL TUMOR   [facility]: [facility] ROOM 10 / [facility]   Surgeons: Kerrin Elspeth BROCKS, MD       DISCUSSION: Patient is an 84 year old female scheduled for the above procedure. 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 She has known prior bilateral chest radiation as part of treatment for breast cancer; However, per Dr. Chrystal discussion with general surgeon Dr. Debby Shipper, this is felt to be a primary chest wall mass as pathology was different from her breast cancers.  Plan for left chest wall mass resection and reconstruction. She has deferred RAD-ONC evaluation for now.   Other history includes former smoker (quit 07/19/1982), HLD, CAD (CAC 691, 90th percentile), childhood asthma, breast cancer (left breast lumpectomy for DCIS 11/08/2017, s/p radiation; right breast lumpectomy for IDC, DCIS 01/03/2021, s/p radiation), GERD, IBS, skin cancer, alpha-gal allergy.   Dr. Charlott classified her Zubrod score as 0: Normal activity, no symptoms.  She was last evaluated by cardiologist Dr. Court on 03/30/2024.  She has a family history of CAD and had an elevated coronary calcium score of 691 (90th percentile) in 2021.  Myoview  stress test on 10/15/2019 was normal.  She is on Repatha  for HLD.  At recent visit she continued to do well and was fairly active without chest pain or shortness of breath.  He noted upcoming evaluation by Dr. 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  No new orders with 98-month follow-up planned.  Anesthesia team to evaluate on the day of surgery.  VS: BP  133/79   Pulse 85   Temp 36.9 C   Resp 17   Ht 5' 4 (1.626 m)   Wt 69.3 kg   SpO2 96%   BMI 26.23 kg/m   PROVIDERS: Clarice Nottingham, MD is PCP  Court Carrier, MD is cardiologist  LABS: Preoperative labs noted. Per [facility], she was prescribed Bactrim  for 3 days for leukocytes in UA. (all labs ordered are listed, but only abnormal results are displayed)  Labs Reviewed  COMPREHENSIVE METABOLIC PANEL WITH GFR - Abnormal; Notable for the following components:      Result Value   Glucose, Bld 100 (*)    All other components within normal limits  APTT - Abnormal; Notable for the following components:   aPTT 37 (*)    All other components within normal limits  URINALYSIS, ROUTINE W REFLEX MICROSCOPIC - Abnormal; Notable for the following components:   APPearance HAZY (*)    Leukocytes,Ua SMALL (*)    Bacteria, UA RARE (*)    All other components within normal limits  SURGICAL PCR SCREEN  CBC  PROTIME-INR  TYPE AND SCREEN    IMAGES: CXR 04/02/2024: FINDINGS: The cardiomediastinal contours are normal. Left perihilar scarring. Pulmonary vasculature is normal. No consolidation, pleural effusion, or pneumothorax. No acute osseous abnormalities are seen. Surgical clips in the bilateral breast/chest wall. IMPRESSION: Left perihilar scarring. No acute findings.   CT Chest 02/17/2024: IMPRESSION: 1. There is an irregular at least 3.5 x 5.5 cm mass in the left breast, inferiorly which infiltrates into the  underlying intercostal muscles. Underlying ribs are intact. However, this is highly concerning for local recurrent tumor. Ultrasound/tissue sampling is recommended. 2. There is linear opacity in the left upper lobe reaching up to the pleural surface, favored to represent scarring/atelectasis. However, considering the left breast mass described above, attention on follow-up examination versus comparison with prior imaging is recommended to exclude underlying neoplastic  process. 3. Multiple other nonacute observations, as described above. Aortic Atherosclerosis (ICD10-I70.0).   EKG: 03/30/2024: Normal sinus rhythm Left axis deviation Pulmonary disease pattern When compared with ECG of 01-Apr-2023 14:44, Fusion complexes are no longer Present Confirmed by Court Carrier [phone]) on 03/30/2024 3:29:33 PM   CV: Nuclear stress test 10/15/2019: There was no ST segment deviation noted during stress. The left ventricular ejection fraction is normal (55-65%). Nuclear stress EF: 64%. The study is normal. This is a low risk study.    CT Cardiac Calcium Scoring 08/12/2019 ([facility]): FINDINGS:  CALCIUM SCORE: 691.  LM: 168.  LAD: 89.  Cx: 201.  RCA: 236  PDA: 0   Cardiac anatomy:  Normal heart size, no evidence of pericardial effusion  Mediastinum: No adenopathy  Visible abdomen: No significant abnormality  Visible lung fields: Calcified granuloma in the anteromedial left upper lobe. Scarring in the lingula.   IMPRESSION:  Extensive calcified coronary artery plaque putting the patient at nearly the 90th percentile for age.   Past Medical History:  Diagnosis Date   Allergic rhinitis    Allergy to alpha-gal    Anal fissure    Anxiety    Asthma    only as a child; went away by age [age]   Breast cancer (HCC)    Calcified granuloma of lung    Cancer of the skin, basal cell    Coronary artery disease    Depression    Family history of breast cancer 12/19/2020   GERD (gastroesophageal reflux disease)    sometimes   Hyperglycemia    Hyperlipemia    Hyperlipidemia    IBS (irritable bowel syndrome)    Nicotine dependence    Osteopenia    Personal history of radiation therapy    Seasonal affective disorder    Seasonal allergies    Status post dilation of esophageal narrowing     Past Surgical History:  Procedure Laterality Date   BREAST BIOPSY Left 08/2017   BREAST BIOPSY Left 03/02/2024   US  LT BREAST BX W LOC DEV 1ST LESION IMG BX SPEC US   GUIDE 03/02/2024 [facility]MO[facility]   BREAST LUMPECTOMY Left 10/2017   BREAST LUMPECTOMY Right 10/2020   BREAST LUMPECTOMY WITH NEEDLE LOCALIZATION Left 11/08/2017   Procedure: LEFT BREAST SUBAREOLAR BIOPSY WITH NEEDLE LOCALIZATION;  Surgeon: Gail Favorite, MD;  Location: [facility];  Service: General;  Laterality: Left;   BREAST LUMPECTOMY WITH RADIOACTIVE SEED LOCALIZATION Left 11/08/2017   Procedure: LEFT BREAST LUMPECTOMY WITH RADIOACTIVE SEED LOCALIZATION X 2;  Surgeon: Gail Favorite, MD;  Location: [facility];  Service: General;  Laterality: Left;   BREAST LUMPECTOMY WITH RADIOACTIVE SEED LOCALIZATION Right 01/03/2021   Procedure: RIGHT BREAST LUMPECTOMY WITH RADIOACTIVE SEED LOCALIZATION;  Surgeon: Vanderbilt Ned, MD;  Location: [facility];  Service: General;  Laterality: Right;   BROW LIFT     CATARACT EXTRACTION Bilateral    COLONOSCOPY  2010   Incomplete PE negative redundant colon   TONSILLECTOMY     age 65    MEDICATIONS:  aspirin EC 81 MG tablet   Cholecalciferol (VITAMIN D3)  1000 units CAPS   EPINEPHrine  (EPIPEN  2-PAK) 0.3 mg/0.3 mL IJ SOAJ injection   fluticasone (VERAMYST) 27.5 MCG/SPRAY nasal spray   PROLIA 60 MG/ML SOSY injection   REPATHA  SURECLICK 140 MG/ML SOAJ   sulfamethoxazole -trimethoprim  (BACTRIM  DS) 800-160 MG tablet   tiZANidine  (ZANAFLEX ) 2 MG tablet   traMADol  (ULTRAM ) 50 MG tablet   Triamcinolone  Acetonide (ZILRETTA ) 32 MG SRER intra-articular injection   No current facility-administered medications for this encounter.    Isaiah Ruder, PA-C Surgical Short Stay/Anesthesiology [facility] Phone [phone] [facility] Phone [phone] 04/03/2024 9:45 AM

## 2024-04-03 NOTE — Anesthesia Preprocedure Evaluation (Signed)
"                                    Anesthesia Evaluation    Airway        Dental   Pulmonary former smoker          Cardiovascular      Neuro/Psych    GI/Hepatic   Endo/Other    Renal/GU      Musculoskeletal   Abdominal   Peds  Hematology   Anesthesia Other Findings   Reproductive/Obstetrics                              Anesthesia Physical Anesthesia Plan  ASA:   Anesthesia Plan:    Post-op Pain Management:    Induction:   PONV Risk Score and Plan:   Airway Management Planned:   Additional Equipment:   Intra-op Plan:   Post-operative Plan:   Informed Consent:   Plan Discussed with:   Anesthesia Plan Comments: (PAT note written 04/03/2024 by Idabell Picking, PA-C.  )        Anesthesia Quick Evaluation  "

## 2024-04-06 ENCOUNTER — Encounter (HOSPITAL_COMMUNITY): Payer: Self-pay | Admitting: Vascular Surgery

## 2024-04-06 ENCOUNTER — Encounter (HOSPITAL_COMMUNITY): Admission: RE | Payer: Self-pay | Source: Home / Self Care

## 2024-04-06 ENCOUNTER — Inpatient Hospital Stay (HOSPITAL_COMMUNITY)
Admission: RE | Admit: 2024-04-06 | Source: Home / Self Care | Admitting: Thoracic Surgery (Cardiothoracic Vascular Surgery)

## 2024-04-29 ENCOUNTER — Inpatient Hospital Stay: Admitting: Hematology and Oncology

## 2024-04-29 ENCOUNTER — Inpatient Hospital Stay

## 2024-05-08 ENCOUNTER — Encounter: Admitting: Adult Health
